# Patient Record
Sex: Male | Born: 1940 | Race: White | Hispanic: No | Smoking: Former smoker
Health system: Southern US, Community
[De-identification: ages and names within clinical notes are randomized; demographics above are authoritative.]

## PROBLEM LIST (undated history)

## (undated) DIAGNOSIS — F411 Generalized anxiety disorder: Secondary | ICD-10-CM

## (undated) DIAGNOSIS — R5381 Other malaise: Secondary | ICD-10-CM

## (undated) DIAGNOSIS — M1990 Unspecified osteoarthritis, unspecified site: Secondary | ICD-10-CM

## (undated) DIAGNOSIS — K635 Polyp of colon: Secondary | ICD-10-CM

## (undated) DIAGNOSIS — E785 Hyperlipidemia, unspecified: Secondary | ICD-10-CM

## (undated) DIAGNOSIS — E7800 Pure hypercholesterolemia, unspecified: Secondary | ICD-10-CM

## (undated) DIAGNOSIS — G629 Polyneuropathy, unspecified: Secondary | ICD-10-CM

## (undated) DIAGNOSIS — F419 Anxiety disorder, unspecified: Secondary | ICD-10-CM

## (undated) HISTORY — DX: Generalized anxiety disorder: F41.1

## (undated) HISTORY — PX: COLONOSCOPY W/ BIOPSIES AND POLYPECTOMY: SHX1376

## (undated) HISTORY — PX: OTHER SURGICAL HISTORY: SHX169

## (undated) HISTORY — PX: CHOLECYSTECTOMY: SHX55

## (undated) HISTORY — PX: APPENDECTOMY: SHX54

---
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
## 2001-11-05 ENCOUNTER — Ambulatory Visit (HOSPITAL_COMMUNITY): Admission: RE | Admit: 2001-11-05 | Discharge: 2001-11-05 | Payer: Self-pay | Admitting: Pulmonary Disease

## 2004-04-06 ENCOUNTER — Ambulatory Visit (HOSPITAL_COMMUNITY): Admission: RE | Admit: 2004-04-06 | Discharge: 2004-04-06 | Payer: Self-pay | Admitting: Internal Medicine

## 2004-05-25 ENCOUNTER — Ambulatory Visit: Payer: Self-pay | Admitting: Internal Medicine

## 2005-04-18 ENCOUNTER — Ambulatory Visit: Payer: Self-pay | Admitting: Cardiology

## 2005-05-17 ENCOUNTER — Ambulatory Visit: Payer: Self-pay | Admitting: Cardiology

## 2006-06-27 ENCOUNTER — Ambulatory Visit: Payer: Self-pay | Admitting: Internal Medicine

## 2006-06-27 ENCOUNTER — Encounter (INDEPENDENT_AMBULATORY_CARE_PROVIDER_SITE_OTHER): Payer: Self-pay | Admitting: *Deleted

## 2006-06-27 ENCOUNTER — Ambulatory Visit (HOSPITAL_COMMUNITY): Admission: RE | Admit: 2006-06-27 | Discharge: 2006-06-27 | Payer: Self-pay | Admitting: Internal Medicine

## 2007-02-20 ENCOUNTER — Ambulatory Visit: Payer: Self-pay | Admitting: Psychology

## 2007-02-20 ENCOUNTER — Encounter: Admission: RE | Admit: 2007-02-20 | Discharge: 2007-03-27 | Payer: Self-pay | Admitting: Neurology

## 2009-01-16 ENCOUNTER — Ambulatory Visit (HOSPITAL_COMMUNITY): Admission: RE | Admit: 2009-01-16 | Discharge: 2009-01-16 | Payer: Self-pay | Admitting: Pulmonary Disease

## 2010-11-05 NOTE — Op Note (Signed)
NAMERIYAN, Scott Chang               ACCOUNT NO.:  [id number]   MEDICAL RECORD NO.:  [id number]          PATIENT TYPE:  AMB   LOCATION:  DAY                           FACILITY:  [facility]   PHYSICIAN:  Lionel December, M.D.    DATE OF BIRTH:  01-11-1941   DATE OF PROCEDURE:  06/27/2006  DATE OF DISCHARGE:                               OPERATIVE REPORT   PROCEDURE:  Colonoscopy.   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  Procedure risks were reviewed the patient, informed consent  was obtained.   MEDS CONSCIOUS SEDATION:  Demerol 50 mg IV, Versed 7 mg IV.   FINDINGS:  Procedure performed in endoscopy suite.  The patient's vital  signs and O2 sat were monitored during the procedure and remained  stable.  The patient was placed left lateral position.  Rectal  examination performed.  No abnormality noted on external or digital  exam.  Pentax videoscope was placed rectum and advanced under vision  into sigmoid colon beyond.  Preparation was satisfactory.  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  Scope was  passed into cecum which was identified by ileocecal valve and  appendiceal orifice.  There was a tiny polyp to the right of ileocecal  valve which was ablated via cold biopsy.  As the scope was withdrawn  colonic mucosa was carefully examined and was normal throughout.  Rectal  mucosa similarly was normal.  Scope was retroflexed to examine anorectal  junction which was unremarkable.  Endoscope was straightened and  withdrawn.  The patient tolerated the procedure well.   FINAL DIAGNOSIS:  Small polyp ablated via cold biopsy from the cecum.  Melanosis coli.   RECOMMENDATIONS:  He will continue high-fiber diet, fiber supplement and  MiraLax as before.   I will be contacting patient  with results of biopsy.  He should return  for follow-up exam in 5 years from now.      Lionel December, M.D.  Electronically Signed     NR/MEDQ  D:  06/27/2006  T:  06/27/2006  Job:  [id number]   cc:   Ramon Dredge L. Juanetta Gosling, M.D.  Fax: [phone]

## 2011-06-23 ENCOUNTER — Encounter (INDEPENDENT_AMBULATORY_CARE_PROVIDER_SITE_OTHER): Payer: Self-pay | Admitting: *Deleted

## 2011-08-18 ENCOUNTER — Other Ambulatory Visit (HOSPITAL_COMMUNITY): Payer: Self-pay | Admitting: Pulmonary Disease

## 2011-08-18 ENCOUNTER — Ambulatory Visit (HOSPITAL_COMMUNITY)
Admission: RE | Admit: 2011-08-18 | Discharge: 2011-08-18 | Disposition: A | Discharge status: Home / Self Care | Payer: Medicare Other | Source: Ambulatory Visit | Attending: Pulmonary Disease | Admitting: Pulmonary Disease

## 2011-08-18 DIAGNOSIS — R059 Cough, unspecified: Secondary | ICD-10-CM

## 2011-08-18 DIAGNOSIS — R05 Cough: Secondary | ICD-10-CM | POA: Insufficient documentation

## 2011-08-18 DIAGNOSIS — R079 Chest pain, unspecified: Secondary | ICD-10-CM | POA: Insufficient documentation

## 2013-02-19 ENCOUNTER — Ambulatory Visit (INDEPENDENT_AMBULATORY_CARE_PROVIDER_SITE_OTHER): Payer: Medicare Other | Admitting: Neurology

## 2013-02-19 ENCOUNTER — Ambulatory Visit (INDEPENDENT_AMBULATORY_CARE_PROVIDER_SITE_OTHER): Payer: Medicare Other

## 2013-02-19 DIAGNOSIS — G5600 Carpal tunnel syndrome, unspecified upper limb: Secondary | ICD-10-CM

## 2013-02-19 DIAGNOSIS — M79609 Pain in unspecified limb: Secondary | ICD-10-CM

## 2013-02-19 DIAGNOSIS — IMO0002 Reserved for concepts with insufficient information to code with codable children: Secondary | ICD-10-CM

## 2013-02-19 DIAGNOSIS — R209 Unspecified disturbances of skin sensation: Secondary | ICD-10-CM

## 2013-02-19 DIAGNOSIS — G629 Polyneuropathy, unspecified: Secondary | ICD-10-CM

## 2013-02-19 DIAGNOSIS — M545 Low back pain: Secondary | ICD-10-CM

## 2013-02-19 NOTE — Procedures (Signed)
    [facility]  NCS (NERVE CONDUCTION STUDY) WITH EMG (ELECTROMYOGRAPHY) REPORT   STUDY DATE: 02/19/2013 PATIENT NAME: Scott Chang DOB: July 17, 1940 MRN: [id number]    TECHNOLOGIST: Gearldine Shown ELECTROMYOGRAPHER: Levert Feinstein M.D.  CLINICAL INFORMATION:   72 years old right-handed Caucasian male, with a long-standing history of chronic back and neck pain, worsened over the past 3 months, constant bilateral below-the-knee pain, subjective weakness and gait difficulty due to pain, no sensory loss, no incontinence  On examination bilateral lower extremity motor examination was normal, sensory was intact to light touch, patellar reflexes 2/4. absent Achilles reflexes.  FINDINGS: NERVE CONDUCTION STUDY: Bilateral peroneal sensory responses showed mildly decreased amplitude, with normal peak latency.  Bilateral tibial motor responses were normal. Bilateral peroneal to EDB motor response showed mild to moderately decreased C. map amplitude, normal conduction velocity, distal latency. Bilateral tibial H. reflexes were absent.  Right ulnar sensory and motor responses were normal.  Bilateral median sensory response showed mild to moderately prolonged peak latency, with normal snap amplitude. Bilateral median motor responses showed mild to moderately prolonged distal latency, normal C. map amplitude, conduction velocity.     NEEDLE ELECTROMYOGRAPHY: Selected needle examination was performed at right lower extremity muscles, and right lumbosacral paraspinal muscles  Needle examination of right tibialis anterior, tibialis posterior, medial gastrocnemius, peroneal longus, biceps femoris long head, vastus lateralis was normal  There was no spontaneous activity at the right lumbosacral paraspinal muscles, right L4, L5, S1  IMPRESSION:  This is a mild abnormal study. There is electrodiagnostic evidence of mild length dependent peripheral neuropathy, there is no evidence of  right lumbosacral radiculopathy.  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   INTERPRETING PHYSICIAN:   Levert Feinstein M.D. Ph.D. [facility] [address], [address] [place], [place] [address] [phone]

## 2013-03-12 ENCOUNTER — Telehealth (INDEPENDENT_AMBULATORY_CARE_PROVIDER_SITE_OTHER): Payer: Self-pay | Admitting: *Deleted

## 2013-03-12 NOTE — Telephone Encounter (Signed)
Last TCS was 06/27/06 and the RECOMMENDATIONS:follow-up exam in 5 years. The return phone number is [phone].

## 2013-03-13 ENCOUNTER — Telehealth (INDEPENDENT_AMBULATORY_CARE_PROVIDER_SITE_OTHER): Payer: Self-pay | Admitting: *Deleted

## 2013-03-13 ENCOUNTER — Other Ambulatory Visit (INDEPENDENT_AMBULATORY_CARE_PROVIDER_SITE_OTHER): Payer: Self-pay | Admitting: *Deleted

## 2013-03-13 DIAGNOSIS — Z86010 Personal history of colonic polyps: Secondary | ICD-10-CM

## 2013-03-13 DIAGNOSIS — Z1211 Encounter for screening for malignant neoplasm of colon: Secondary | ICD-10-CM

## 2013-03-13 MED ORDER — PEG-KCL-NACL-NASULF-NA ASC-C 100 G PO SOLR: 1.0000 | Freq: Once | ORAL | Status: DC

## 2013-03-13 NOTE — Telephone Encounter (Signed)
Patient needs movi prep 

## 2013-03-13 NOTE — Telephone Encounter (Signed)
TCS sch'd 04/24/13, patient aware

## 2013-04-22 ENCOUNTER — Telehealth (INDEPENDENT_AMBULATORY_CARE_PROVIDER_SITE_OTHER): Payer: Self-pay | Admitting: *Deleted

## 2013-04-22 NOTE — Telephone Encounter (Signed)
  Procedure: tcs  Reason/Indication:  Hx polyps  Has patient had this procedure before?  Yes, 2008 (EPIC)  If so, when, by whom and where?    Is there a family history of colon cancer?  no  Who?  What age when diagnosed?    Is patient diabetic?   no      Does patient have prosthetic heart valve?  no  Do you have a pacemaker?  no  Has patient ever had endocarditis? no  Has patient had joint replacement within last 12 months?  no  Does patient tend to be constipated or take laxatives? no  Is patient on Coumadin, Plavix and/or Aspirin? no  Medications: lipitor 20 mg daily, hydrocodone 10/325 mg bid, xanax 1 mg bid, fish oil 1000 mg, flax seed oil 1000 mg, centrum silver  Allergies: pcn  Medication Adjustment:   Procedure date & time: 05/09/13 at 1030

## 2013-04-24 NOTE — Telephone Encounter (Signed)
agree

## 2013-04-25 ENCOUNTER — Encounter (HOSPITAL_COMMUNITY): Payer: Self-pay | Admitting: Pharmacy Technician

## 2013-05-09 ENCOUNTER — Encounter (HOSPITAL_COMMUNITY): Payer: Self-pay | Admitting: *Deleted

## 2013-05-09 ENCOUNTER — Encounter (HOSPITAL_COMMUNITY)
Admission: RE | Disposition: A | Discharge status: Home / Self Care | Payer: Self-pay | Source: Ambulatory Visit | Attending: Internal Medicine

## 2013-05-09 ENCOUNTER — Ambulatory Visit (HOSPITAL_COMMUNITY)
Admission: RE | Admit: 2013-05-09 | Discharge: 2013-05-09 | Disposition: A | Discharge status: Home / Self Care | Payer: Medicare Other | Source: Ambulatory Visit | Attending: Internal Medicine | Admitting: Internal Medicine

## 2013-05-09 DIAGNOSIS — Z860100 Personal history of colon polyps, unspecified: Secondary | ICD-10-CM | POA: Insufficient documentation

## 2013-05-09 DIAGNOSIS — D126 Benign neoplasm of colon, unspecified: Secondary | ICD-10-CM

## 2013-05-09 DIAGNOSIS — K644 Residual hemorrhoidal skin tags: Secondary | ICD-10-CM

## 2013-05-09 HISTORY — DX: Polyneuropathy, unspecified: G62.9

## 2013-05-09 HISTORY — PX: COLONOSCOPY: SHX5424

## 2013-05-09 HISTORY — DX: Pure hypercholesterolemia, unspecified: E78.00

## 2013-05-09 HISTORY — DX: Anxiety disorder, unspecified: F41.9

## 2013-05-09 HISTORY — DX: Unspecified osteoarthritis, unspecified site: M19.90

## 2013-05-09 HISTORY — DX: Polyp of colon: K63.5

## 2013-05-09 SURGERY — COLONOSCOPY
Anesthesia: Moderate Sedation

## 2013-05-09 MED ORDER — MIDAZOLAM HCL 5 MG/5ML IJ SOLN
INTRAMUSCULAR | Status: DC | PRN
  Administered 2013-05-09 (×5): 2 mg via INTRAVENOUS

## 2013-05-09 MED ORDER — SODIUM CHLORIDE 0.9 % IV SOLN
INTRAVENOUS | Status: DC
  Administered 2013-05-09: 10:00:00 via INTRAVENOUS

## 2013-05-09 MED ORDER — STERILE WATER FOR IRRIGATION IR SOLN
Status: DC | PRN
  Administered 2013-05-09: 11:00:00

## 2013-05-09 MED ORDER — MEPERIDINE HCL 50 MG/ML IJ SOLN
INTRAMUSCULAR | Status: AC
  Filled 2013-05-09: qty 1

## 2013-05-09 MED ORDER — MEPERIDINE HCL 50 MG/ML IJ SOLN
INTRAMUSCULAR | Status: DC | PRN
  Administered 2013-05-09 (×2): 25 mg via INTRAVENOUS

## 2013-05-09 MED ORDER — MIDAZOLAM HCL 5 MG/5ML IJ SOLN
INTRAMUSCULAR | Status: AC
  Filled 2013-05-09: qty 10

## 2013-05-09 NOTE — H&P (Signed)
Scott Chang is an 72 y.o. male.   Chief Complaint: Patient is here for colonoscopy. HPI: Patient is 72 year old Caucasian male with history of colonic adenomas and is for surveillance colonoscopy. His last exam was in 2001. He denies abdominal pain change in bowel habits or rectal bleeding. Family history is negative for CRC but his brother also has had colonic adenomas.  Past Medical History  Diagnosis Date  . Hypercholesteremia   . Anxiety   . Colon polyps   . Arthritis   . Neuropathy     Past Surgical History  Procedure Laterality Date  . Colonoscopy w/ biopsies and polypectomy    . Left knee arthroscopy    . Left shoulder arthroscopy      X 2  . Cholecystectomy    . Appendectomy      Family History  Problem Relation Age of Onset  . Colon cancer Neg Hx    Social History:  reports that he has quit smoking. His smoking use included Cigarettes. He has a 40 pack-year smoking history. He does not have any smokeless tobacco history on file. He reports that he drinks alcohol. He reports that he does not use illicit drugs.  Allergies:  Allergies  Allergen Reactions  . Penicillins Itching and Rash    Medications Prior to Admission  Medication Sig Dispense Refill  . ALPRAZolam (XANAX) 1 MG tablet Take 1 mg by mouth at bedtime as needed for sleep.       Marland Kitchen atorvastatin (LIPITOR) 20 MG tablet Take 20 mg by mouth daily.      . Coenzyme Q10 200 MG capsule Take 200 mg by mouth daily.      . fish oil-omega-3 fatty acids 1000 MG capsule Take 1 g by mouth daily.      . Flaxseed, Linseed, (FLAXSEED OIL) 1200 MG CAPS Take 1 capsule by mouth daily.      Marland Kitchen HYDROcodone-acetaminophen (NORCO) 10-325 MG per tablet Take 1 tablet by mouth every 6 (six) hours as needed for moderate pain.      . Multiple Vitamin (MULTIVITAMIN WITH MINERALS) TABS tablet Take 1 tablet by mouth daily.      . peg 3350 powder (MOVIPREP) 100 G SOLR Take 1 kit (200 g total) by mouth once.  1 kit  0  . Polyethyl  Glycol-Propyl Glycol (SYSTANE OP) Place 1 drop into both eyes daily as needed (Dry Eyes).      . Potassium Gluconate 595 MG CAPS Take 1 capsule by mouth daily.        No results found for this or any previous visit (from the past 48 hour(s)). No results found.  ROS  Blood pressure 132/74, pulse 70, temperature 97.7 F (36.5 C), temperature source Oral, resp. rate 12, height 5\' 11"  (1.803 m), weight 165 lb (74.844 kg), SpO2 100.00%. Physical Exam  Constitutional: He appears well-developed and well-nourished.  HENT:  Mouth/Throat: Oropharynx is clear and moist.  Eyes: Conjunctivae are normal. No scleral icterus.  Neck: No thyromegaly present.  Cardiovascular: Normal rate, regular rhythm and normal heart sounds.   No murmur heard. Respiratory: Effort normal and breath sounds normal.  GI: Soft. He exhibits no distension and no mass. There is no tenderness.  Musculoskeletal: He exhibits no edema.  Lymphadenopathy:    He has no cervical adenopathy.  Neurological: He is alert.  Skin: Skin is warm and dry.     Assessment/Plan History of colonic adenomas. Surveillance colonoscopy.  Maeleigh Buschman U 05/09/2013, 10:30 AM

## 2013-05-09 NOTE — Op Note (Signed)
COLONOSCOPY PROCEDURE REPORT  PATIENT:  Scott Chang  MR#:  [id number] Birthdate:  [birth year], 72 y.o., male Endoscopist:  Dr. Malissa Hippo, MD Referred By:  Dr. Ignatius Specking, MD Procedure Date: 05/09/2013  Procedure:   Colonoscopy  Indications:  Patient is 72 year old Caucasian male with history of colonic adenomas. His last colonoscopy was in January 2008.  Informed Consent:  The procedure and risks were reviewed with the patient and informed consent was obtained.  Medications:  Demerol 50 mg IV Versed 10 mg IV  Description of procedure:  After a digital rectal exam was performed, that colonoscope was advanced from the anus through the rectum and colon to the area of the cecum, ileocecal valve and appendiceal orifice. The cecum was deeply intubated. These structures were well-seen and photographed for the record. From the level of the cecum and ileocecal valve, the scope was slowly and cautiously withdrawn. The mucosal surfaces were carefully surveyed utilizing scope tip to flexion to facilitate fold flattening as needed. The scope was pulled down into the rectum where a thorough exam including retroflexion was performed.  Findings:   Prep excellent. 3 mm polyp ablated via cold biopsy from the cecum. Mucosa of the rest of the colon and rectum was normal. Small hemorrhoids below the dentate line.   Therapeutic/Diagnostic Maneuvers Performed:  See above  Complications:  None  Cecal Withdrawal Time:  11 minutes  Impression:  Examination performed to cecum. 3 mm cecal polyp ablated via cold biopsy. Small external hemorrhoids.  Recommendations:  Standard instructions given. I will contact patient with biopsy results and further recommendations.  Chiquita Heckert U  05/09/2013 11:03 AM  CC: Dr. Ignatius Specking., MD & Dr. Bonnetta Barry ref. provider found

## 2013-05-15 ENCOUNTER — Encounter (HOSPITAL_COMMUNITY): Payer: Self-pay | Admitting: Internal Medicine

## 2013-05-22 ENCOUNTER — Encounter (INDEPENDENT_AMBULATORY_CARE_PROVIDER_SITE_OTHER): Payer: Self-pay | Admitting: *Deleted

## 2013-10-04 ENCOUNTER — Other Ambulatory Visit (HOSPITAL_COMMUNITY): Payer: Self-pay | Admitting: Pulmonary Disease

## 2013-10-04 ENCOUNTER — Ambulatory Visit (HOSPITAL_COMMUNITY)
Admission: RE | Admit: 2013-10-04 | Discharge: 2013-10-04 | Disposition: A | Discharge status: Home / Self Care | Payer: Medicare PPO | Source: Ambulatory Visit | Attending: Pulmonary Disease | Admitting: Pulmonary Disease

## 2013-10-04 DIAGNOSIS — R0602 Shortness of breath: Secondary | ICD-10-CM

## 2013-10-08 ENCOUNTER — Ambulatory Visit (HOSPITAL_COMMUNITY)
Admission: RE | Admit: 2013-10-08 | Discharge: 2013-10-08 | Disposition: A | Discharge status: Home / Self Care | Payer: Medicare PPO | Source: Ambulatory Visit | Attending: Pulmonary Disease | Admitting: Pulmonary Disease

## 2013-10-08 ENCOUNTER — Other Ambulatory Visit: Payer: Self-pay | Admitting: Pulmonary Disease

## 2013-10-08 DIAGNOSIS — R0609 Other forms of dyspnea: Secondary | ICD-10-CM | POA: Insufficient documentation

## 2013-10-08 DIAGNOSIS — E785 Hyperlipidemia, unspecified: Secondary | ICD-10-CM | POA: Insufficient documentation

## 2013-10-08 DIAGNOSIS — Z87891 Personal history of nicotine dependence: Secondary | ICD-10-CM | POA: Insufficient documentation

## 2013-10-08 DIAGNOSIS — R0989 Other specified symptoms and signs involving the circulatory and respiratory systems: Principal | ICD-10-CM | POA: Insufficient documentation

## 2013-10-08 NOTE — Progress Notes (Signed)
*  PRELIMINARY RESULTS* Echocardiogram 2D Echocardiogram has been performed.  Su Grand Shell Blanchette 10/08/2013, 9:16 AM

## 2013-10-09 ENCOUNTER — Other Ambulatory Visit (HOSPITAL_COMMUNITY): Payer: Self-pay | Admitting: Pulmonary Disease

## 2013-10-09 DIAGNOSIS — R0602 Shortness of breath: Secondary | ICD-10-CM

## 2013-10-10 ENCOUNTER — Ambulatory Visit (HOSPITAL_COMMUNITY): Payer: Medicare PPO

## 2013-10-11 ENCOUNTER — Ambulatory Visit (HOSPITAL_COMMUNITY)
Admission: RE | Admit: 2013-10-11 | Discharge: 2013-10-11 | Disposition: A | Discharge status: Home / Self Care | Payer: Medicare HMO | Source: Ambulatory Visit | Attending: Pulmonary Disease | Admitting: Pulmonary Disease

## 2013-10-11 DIAGNOSIS — R079 Chest pain, unspecified: Secondary | ICD-10-CM | POA: Diagnosis present

## 2013-10-11 DIAGNOSIS — R0602 Shortness of breath: Secondary | ICD-10-CM | POA: Diagnosis not present

## 2013-10-11 LAB — POCT I-STAT CREATININE: Creatinine, Ser: 1 mg/dL (ref 0.50–1.35)

## 2013-10-11 MED ORDER — IOHEXOL 350 MG/ML SOLN
100.0000 mL | Freq: Once | INTRAVENOUS | Status: AC | PRN
  Administered 2013-10-11: 100 mL via INTRAVENOUS

## 2014-07-18 DIAGNOSIS — Z4789 Encounter for other orthopedic aftercare: Secondary | ICD-10-CM | POA: Diagnosis not present

## 2015-01-27 DIAGNOSIS — M545 Low back pain: Secondary | ICD-10-CM | POA: Diagnosis not present

## 2015-01-27 DIAGNOSIS — M159 Polyosteoarthritis, unspecified: Secondary | ICD-10-CM | POA: Diagnosis not present

## 2015-01-27 DIAGNOSIS — Z125 Encounter for screening for malignant neoplasm of prostate: Secondary | ICD-10-CM | POA: Diagnosis not present

## 2015-01-27 DIAGNOSIS — Z0000 Encounter for general adult medical examination without abnormal findings: Secondary | ICD-10-CM | POA: Diagnosis not present

## 2015-01-27 DIAGNOSIS — R0789 Other chest pain: Secondary | ICD-10-CM | POA: Diagnosis not present

## 2015-01-27 DIAGNOSIS — E782 Mixed hyperlipidemia: Secondary | ICD-10-CM | POA: Diagnosis not present

## 2015-01-27 DIAGNOSIS — M5136 Other intervertebral disc degeneration, lumbar region: Secondary | ICD-10-CM | POA: Diagnosis not present

## 2015-01-27 DIAGNOSIS — Z1211 Encounter for screening for malignant neoplasm of colon: Secondary | ICD-10-CM | POA: Diagnosis not present

## 2015-03-27 DIAGNOSIS — Z23 Encounter for immunization: Secondary | ICD-10-CM | POA: Diagnosis not present

## 2015-04-15 DIAGNOSIS — H5210 Myopia, unspecified eye: Secondary | ICD-10-CM | POA: Diagnosis not present

## 2015-04-15 DIAGNOSIS — H5200 Hypermetropia, unspecified eye: Secondary | ICD-10-CM | POA: Diagnosis not present

## 2015-05-10 IMAGING — CR DG CHEST 2V
2 series · 2 of 2 positions shown · non-contrast
Comparison: DG CHEST 2 VIEW dated 08/18/2011

CLINICAL DATA: sob

EXAM:
CHEST  2 VIEW

[view not recorded (1 of 2)]
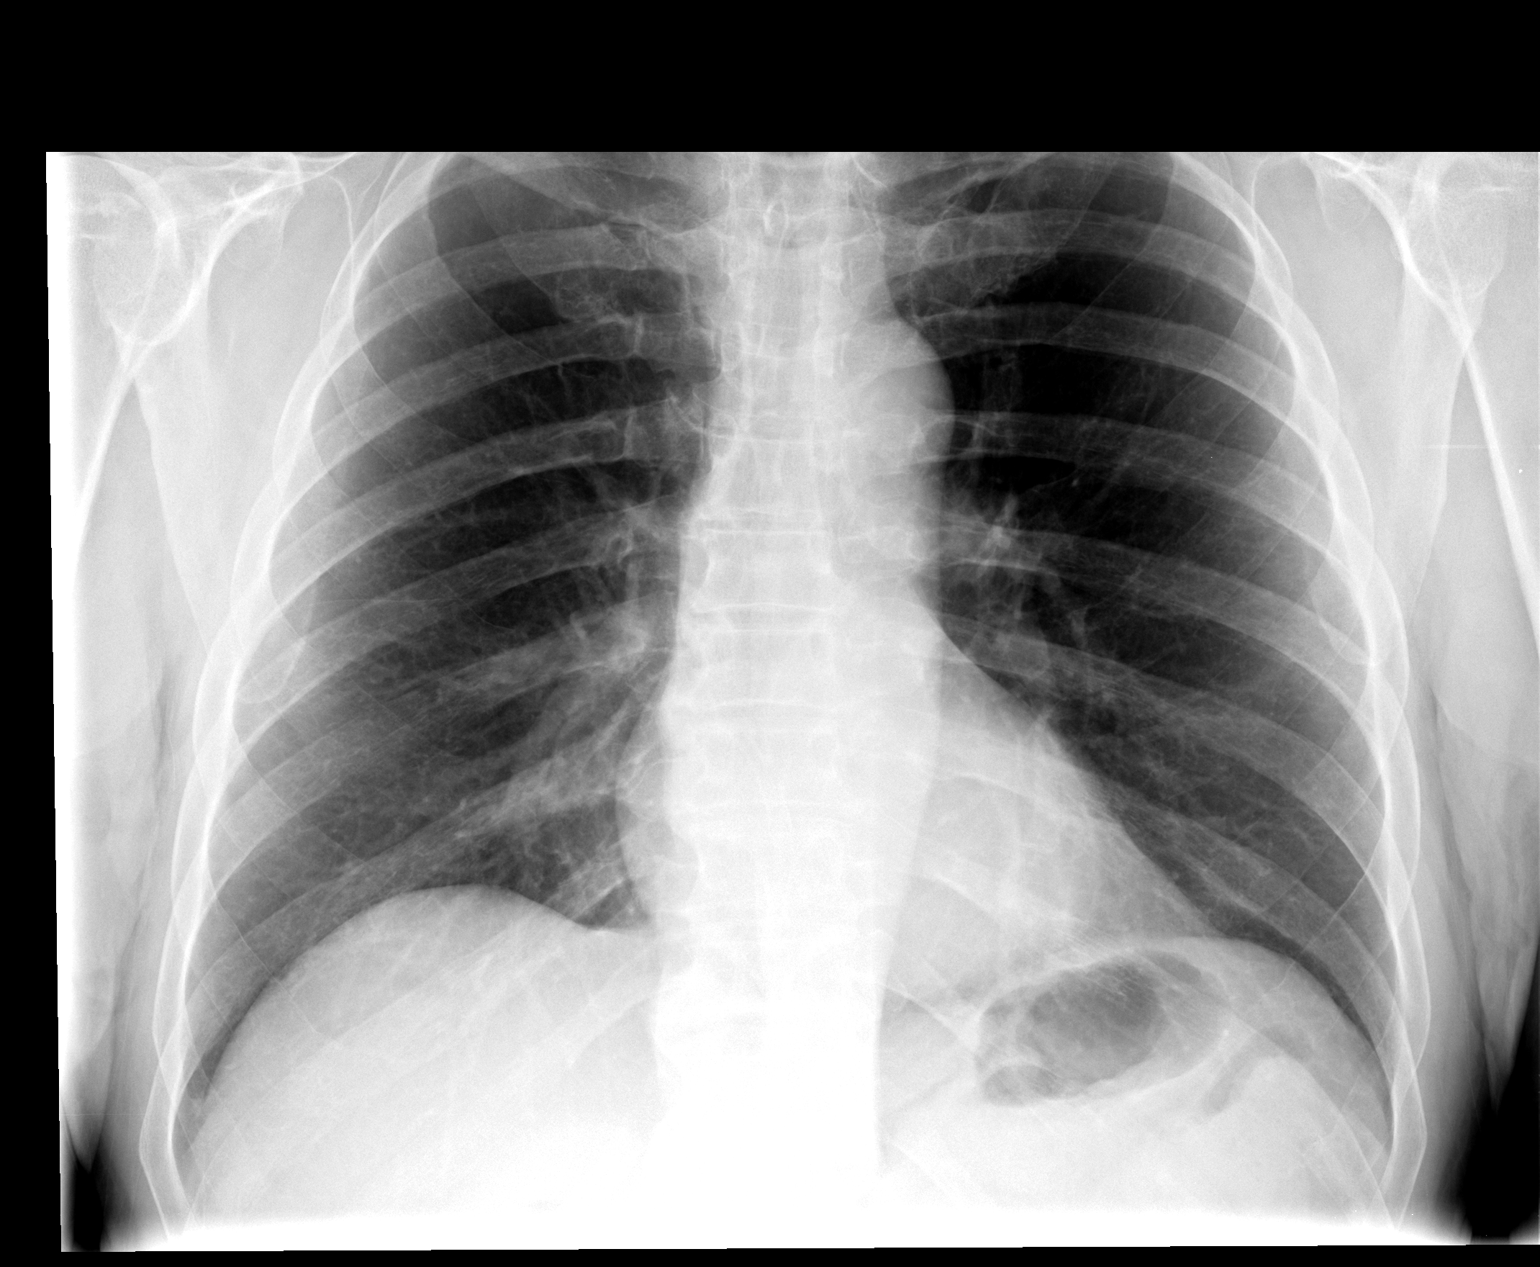

[view not recorded (2 of 2)]
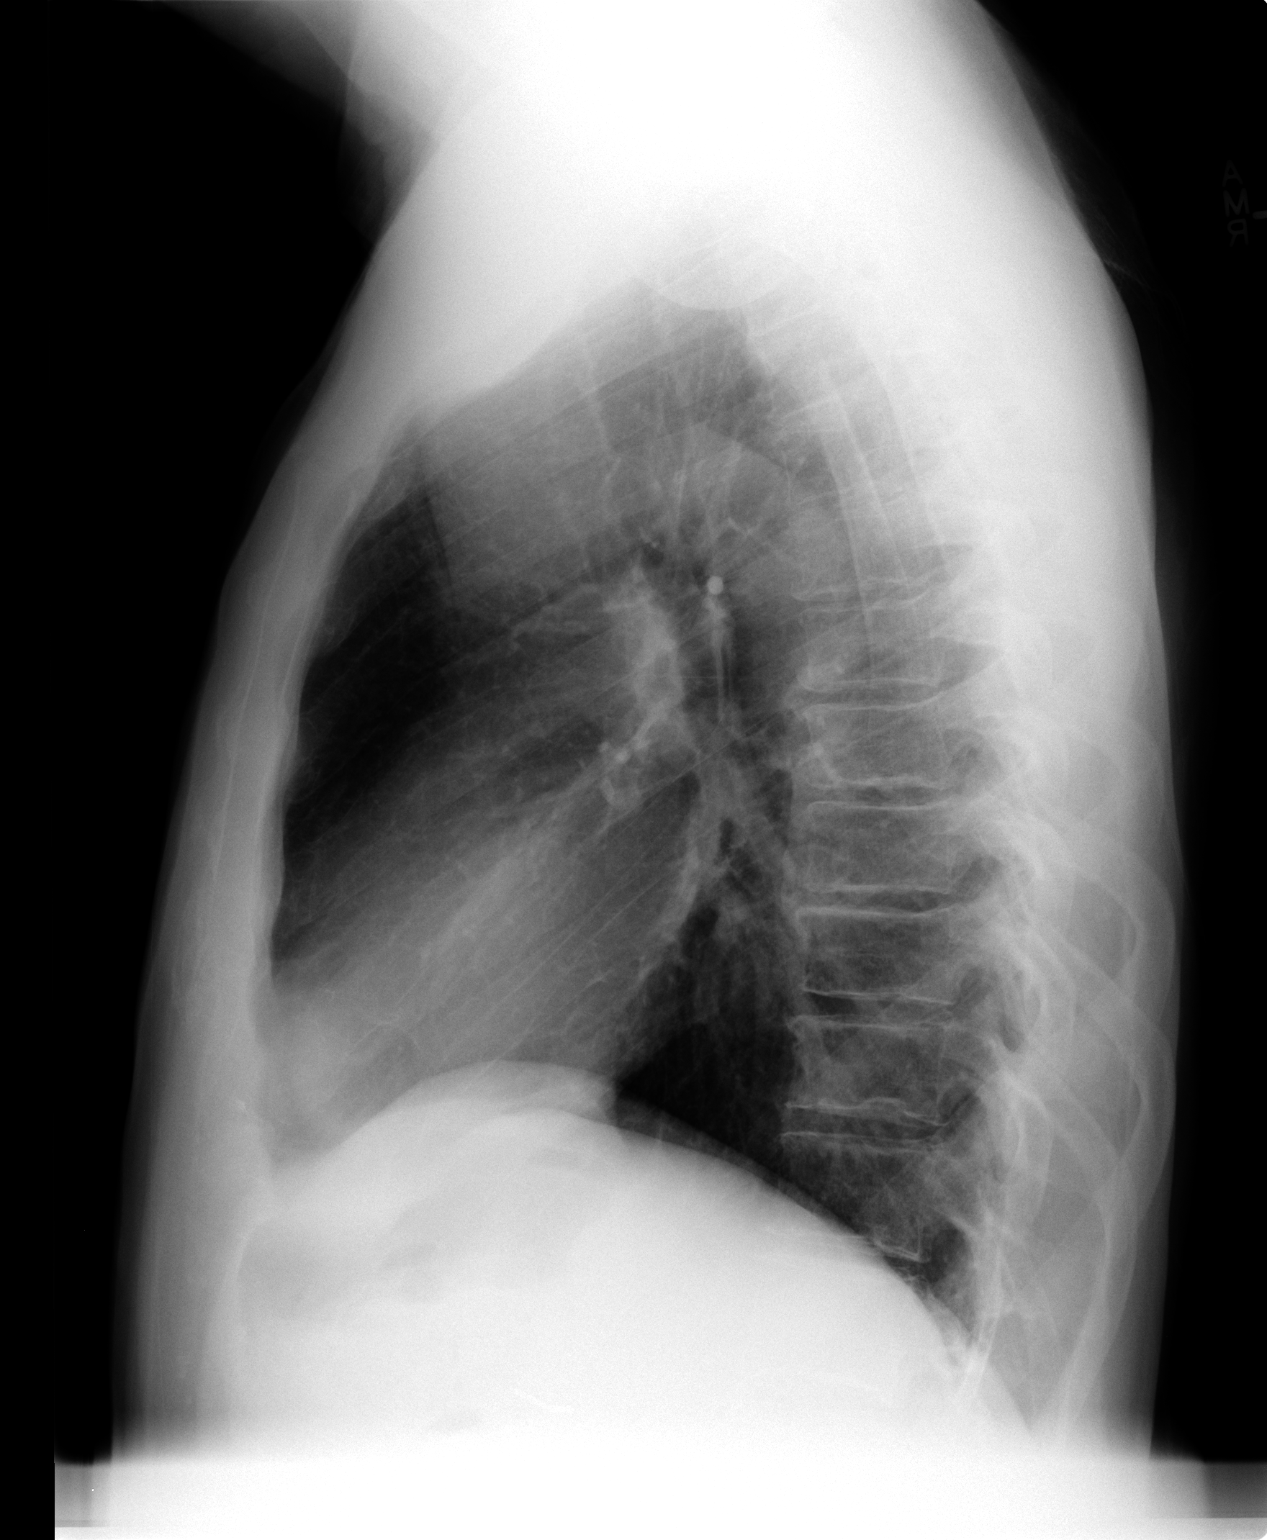

[2 of 2 positions shown; findings below may reference images not displayed]

FINDINGS: The heart size and mediastinal contours are within normal limits.
Both lungs are clear. The visualized skeletal structures are
unremarkable.
IMPRESSION: No active cardiopulmonary disease.

## 2015-06-24 DIAGNOSIS — M25562 Pain in left knee: Secondary | ICD-10-CM | POA: Diagnosis not present

## 2015-06-24 DIAGNOSIS — M25561 Pain in right knee: Secondary | ICD-10-CM | POA: Diagnosis not present

## 2015-06-24 DIAGNOSIS — M170 Bilateral primary osteoarthritis of knee: Secondary | ICD-10-CM | POA: Diagnosis not present

## 2015-07-06 DIAGNOSIS — M170 Bilateral primary osteoarthritis of knee: Secondary | ICD-10-CM | POA: Diagnosis not present

## 2015-07-06 DIAGNOSIS — R2689 Other abnormalities of gait and mobility: Secondary | ICD-10-CM | POA: Diagnosis not present

## 2015-07-06 DIAGNOSIS — M25561 Pain in right knee: Secondary | ICD-10-CM | POA: Diagnosis not present

## 2015-07-06 DIAGNOSIS — R262 Difficulty in walking, not elsewhere classified: Secondary | ICD-10-CM | POA: Diagnosis not present

## 2015-07-06 DIAGNOSIS — M25562 Pain in left knee: Secondary | ICD-10-CM | POA: Diagnosis not present

## 2015-07-20 DIAGNOSIS — M170 Bilateral primary osteoarthritis of knee: Secondary | ICD-10-CM | POA: Diagnosis not present

## 2015-07-20 DIAGNOSIS — M25561 Pain in right knee: Secondary | ICD-10-CM | POA: Diagnosis not present

## 2015-07-20 DIAGNOSIS — R2689 Other abnormalities of gait and mobility: Secondary | ICD-10-CM | POA: Diagnosis not present

## 2015-07-20 DIAGNOSIS — M25562 Pain in left knee: Secondary | ICD-10-CM | POA: Diagnosis not present

## 2015-07-20 DIAGNOSIS — M1711 Unilateral primary osteoarthritis, right knee: Secondary | ICD-10-CM | POA: Diagnosis not present

## 2015-07-23 DIAGNOSIS — M25562 Pain in left knee: Secondary | ICD-10-CM | POA: Diagnosis not present

## 2015-07-23 DIAGNOSIS — R2689 Other abnormalities of gait and mobility: Secondary | ICD-10-CM | POA: Diagnosis not present

## 2015-07-23 DIAGNOSIS — M1712 Unilateral primary osteoarthritis, left knee: Secondary | ICD-10-CM | POA: Diagnosis not present

## 2015-07-23 DIAGNOSIS — M170 Bilateral primary osteoarthritis of knee: Secondary | ICD-10-CM | POA: Diagnosis not present

## 2015-07-23 DIAGNOSIS — M25561 Pain in right knee: Secondary | ICD-10-CM | POA: Diagnosis not present

## 2015-07-27 DIAGNOSIS — M170 Bilateral primary osteoarthritis of knee: Secondary | ICD-10-CM | POA: Diagnosis not present

## 2015-07-27 DIAGNOSIS — M25561 Pain in right knee: Secondary | ICD-10-CM | POA: Diagnosis not present

## 2015-07-27 DIAGNOSIS — M25562 Pain in left knee: Secondary | ICD-10-CM | POA: Diagnosis not present

## 2015-07-27 DIAGNOSIS — M1711 Unilateral primary osteoarthritis, right knee: Secondary | ICD-10-CM | POA: Diagnosis not present

## 2015-07-27 DIAGNOSIS — R2689 Other abnormalities of gait and mobility: Secondary | ICD-10-CM | POA: Diagnosis not present

## 2015-07-30 DIAGNOSIS — M25561 Pain in right knee: Secondary | ICD-10-CM | POA: Diagnosis not present

## 2015-07-30 DIAGNOSIS — M25562 Pain in left knee: Secondary | ICD-10-CM | POA: Diagnosis not present

## 2015-07-30 DIAGNOSIS — M1712 Unilateral primary osteoarthritis, left knee: Secondary | ICD-10-CM | POA: Diagnosis not present

## 2015-07-30 DIAGNOSIS — M170 Bilateral primary osteoarthritis of knee: Secondary | ICD-10-CM | POA: Diagnosis not present

## 2015-07-30 DIAGNOSIS — R2689 Other abnormalities of gait and mobility: Secondary | ICD-10-CM | POA: Diagnosis not present

## 2015-08-03 DIAGNOSIS — M25561 Pain in right knee: Secondary | ICD-10-CM | POA: Diagnosis not present

## 2015-08-03 DIAGNOSIS — M170 Bilateral primary osteoarthritis of knee: Secondary | ICD-10-CM | POA: Diagnosis not present

## 2015-08-03 DIAGNOSIS — M1711 Unilateral primary osteoarthritis, right knee: Secondary | ICD-10-CM | POA: Diagnosis not present

## 2015-08-03 DIAGNOSIS — R2689 Other abnormalities of gait and mobility: Secondary | ICD-10-CM | POA: Diagnosis not present

## 2015-08-03 DIAGNOSIS — M25562 Pain in left knee: Secondary | ICD-10-CM | POA: Diagnosis not present

## 2015-08-06 DIAGNOSIS — M170 Bilateral primary osteoarthritis of knee: Secondary | ICD-10-CM | POA: Diagnosis not present

## 2015-08-06 DIAGNOSIS — R2689 Other abnormalities of gait and mobility: Secondary | ICD-10-CM | POA: Diagnosis not present

## 2015-08-06 DIAGNOSIS — M25562 Pain in left knee: Secondary | ICD-10-CM | POA: Diagnosis not present

## 2015-08-06 DIAGNOSIS — M25561 Pain in right knee: Secondary | ICD-10-CM | POA: Diagnosis not present

## 2015-08-19 DIAGNOSIS — H43811 Vitreous degeneration, right eye: Secondary | ICD-10-CM | POA: Diagnosis not present

## 2016-02-09 DIAGNOSIS — Z1211 Encounter for screening for malignant neoplasm of colon: Secondary | ICD-10-CM | POA: Diagnosis not present

## 2016-02-09 DIAGNOSIS — M5136 Other intervertebral disc degeneration, lumbar region: Secondary | ICD-10-CM | POA: Diagnosis not present

## 2016-02-09 DIAGNOSIS — Z125 Encounter for screening for malignant neoplasm of prostate: Secondary | ICD-10-CM | POA: Diagnosis not present

## 2016-02-09 DIAGNOSIS — M545 Low back pain: Secondary | ICD-10-CM | POA: Diagnosis not present

## 2016-02-09 DIAGNOSIS — Z0000 Encounter for general adult medical examination without abnormal findings: Secondary | ICD-10-CM | POA: Diagnosis not present

## 2016-02-09 DIAGNOSIS — M159 Polyosteoarthritis, unspecified: Secondary | ICD-10-CM | POA: Diagnosis not present

## 2016-02-09 DIAGNOSIS — R0789 Other chest pain: Secondary | ICD-10-CM | POA: Diagnosis not present

## 2016-02-24 DIAGNOSIS — H43813 Vitreous degeneration, bilateral: Secondary | ICD-10-CM | POA: Diagnosis not present

## 2016-07-19 ENCOUNTER — Encounter: Payer: Self-pay | Admitting: Internal Medicine

## 2016-08-31 DIAGNOSIS — H5200 Hypermetropia, unspecified eye: Secondary | ICD-10-CM | POA: Diagnosis not present

## 2016-11-01 DIAGNOSIS — H2513 Age-related nuclear cataract, bilateral: Secondary | ICD-10-CM | POA: Diagnosis not present

## 2016-11-01 DIAGNOSIS — H02839 Dermatochalasis of unspecified eye, unspecified eyelid: Secondary | ICD-10-CM | POA: Diagnosis not present

## 2016-11-01 DIAGNOSIS — H2512 Age-related nuclear cataract, left eye: Secondary | ICD-10-CM | POA: Diagnosis not present

## 2016-11-01 DIAGNOSIS — H40003 Preglaucoma, unspecified, bilateral: Secondary | ICD-10-CM | POA: Diagnosis not present

## 2016-11-01 DIAGNOSIS — H25013 Cortical age-related cataract, bilateral: Secondary | ICD-10-CM | POA: Diagnosis not present

## 2016-11-28 DIAGNOSIS — H2512 Age-related nuclear cataract, left eye: Secondary | ICD-10-CM | POA: Diagnosis not present

## 2016-11-29 DIAGNOSIS — H2511 Age-related nuclear cataract, right eye: Secondary | ICD-10-CM | POA: Diagnosis not present

## 2016-12-05 DIAGNOSIS — H25812 Combined forms of age-related cataract, left eye: Secondary | ICD-10-CM | POA: Diagnosis not present

## 2016-12-19 DIAGNOSIS — H2511 Age-related nuclear cataract, right eye: Secondary | ICD-10-CM | POA: Diagnosis not present

## 2016-12-26 DIAGNOSIS — H25811 Combined forms of age-related cataract, right eye: Secondary | ICD-10-CM | POA: Diagnosis not present

## 2016-12-26 DIAGNOSIS — H25812 Combined forms of age-related cataract, left eye: Secondary | ICD-10-CM | POA: Diagnosis not present

## 2017-01-18 DIAGNOSIS — H2511 Age-related nuclear cataract, right eye: Secondary | ICD-10-CM | POA: Diagnosis not present

## 2017-02-08 ENCOUNTER — Ambulatory Visit (HOSPITAL_COMMUNITY)
Admission: RE | Admit: 2017-02-08 | Discharge: 2017-02-08 | Disposition: A | Discharge status: Home / Self Care | Payer: Medicare PPO | Source: Ambulatory Visit | Attending: Pulmonary Disease | Admitting: Pulmonary Disease

## 2017-02-08 ENCOUNTER — Other Ambulatory Visit (HOSPITAL_COMMUNITY): Payer: Self-pay | Admitting: Pulmonary Disease

## 2017-02-08 DIAGNOSIS — F419 Anxiety disorder, unspecified: Secondary | ICD-10-CM | POA: Diagnosis not present

## 2017-02-08 DIAGNOSIS — M19032 Primary osteoarthritis, left wrist: Secondary | ICD-10-CM | POA: Diagnosis not present

## 2017-02-08 DIAGNOSIS — M542 Cervicalgia: Secondary | ICD-10-CM | POA: Insufficient documentation

## 2017-02-08 DIAGNOSIS — M545 Low back pain: Secondary | ICD-10-CM | POA: Diagnosis not present

## 2017-02-08 DIAGNOSIS — R52 Pain, unspecified: Secondary | ICD-10-CM

## 2017-02-08 DIAGNOSIS — J449 Chronic obstructive pulmonary disease, unspecified: Secondary | ICD-10-CM | POA: Diagnosis not present

## 2017-03-02 DIAGNOSIS — M545 Low back pain: Secondary | ICD-10-CM | POA: Diagnosis not present

## 2017-03-02 DIAGNOSIS — Z125 Encounter for screening for malignant neoplasm of prostate: Secondary | ICD-10-CM | POA: Diagnosis not present

## 2017-03-02 DIAGNOSIS — E782 Mixed hyperlipidemia: Secondary | ICD-10-CM | POA: Diagnosis not present

## 2017-03-02 DIAGNOSIS — R739 Hyperglycemia, unspecified: Secondary | ICD-10-CM | POA: Diagnosis not present

## 2017-03-02 DIAGNOSIS — M159 Polyosteoarthritis, unspecified: Secondary | ICD-10-CM | POA: Diagnosis not present

## 2017-03-02 DIAGNOSIS — R0602 Shortness of breath: Secondary | ICD-10-CM | POA: Diagnosis not present

## 2017-03-02 DIAGNOSIS — R5383 Other fatigue: Secondary | ICD-10-CM | POA: Diagnosis not present

## 2017-03-02 DIAGNOSIS — M5136 Other intervertebral disc degeneration, lumbar region: Secondary | ICD-10-CM | POA: Diagnosis not present

## 2017-03-02 DIAGNOSIS — Z0000 Encounter for general adult medical examination without abnormal findings: Secondary | ICD-10-CM | POA: Diagnosis not present

## 2017-03-20 DIAGNOSIS — Z23 Encounter for immunization: Secondary | ICD-10-CM | POA: Diagnosis not present

## 2017-03-30 ENCOUNTER — Ambulatory Visit (INDEPENDENT_AMBULATORY_CARE_PROVIDER_SITE_OTHER): Payer: Medicare HMO | Admitting: Cardiovascular Disease

## 2017-03-30 ENCOUNTER — Encounter: Payer: Self-pay | Admitting: Cardiovascular Disease

## 2017-03-30 VITALS — BP 112/66 | HR 79 | Ht 70.0 in | Wt 159.0 lb

## 2017-03-30 DIAGNOSIS — R5383 Other fatigue: Secondary | ICD-10-CM

## 2017-03-30 DIAGNOSIS — R0609 Other forms of dyspnea: Secondary | ICD-10-CM | POA: Diagnosis not present

## 2017-03-30 DIAGNOSIS — R0602 Shortness of breath: Secondary | ICD-10-CM | POA: Diagnosis not present

## 2017-03-30 NOTE — Patient Instructions (Signed)
Your physician recommends that you schedule a follow-up appointment in:  In a few weeks with [facility] has requested that you have a lexiscan myoview. For further information please visit [email or web address]. Please follow instruction sheet, as given.     Your physician recommends that you continue on your current medications as directed. Please refer to the Current Medication list given to you today.     If you need a refill on your cardiac medications before your next appointment, please call your pharmacy.      Thank you for choosing [facility] !

## 2017-03-30 NOTE — Progress Notes (Signed)
CARDIOLOGY CONSULT NOTE  Patient ID: Scott Chang MRN: [id number] DOB/AGE: 01/14/1941 76 y.o.  Admit date: (Not on file) Primary Physician: Sinda Du, MD Referring Physician: Dr. Luan Pulling  Reason for Consultation: Shortness of breath  HPI: Scott Chang is a 76 y.o. male who is being seen today for the evaluation of shortness of breath at the request of Sinda Du, MD.   He has been experiencing exertional dyspnea. His PCP who is also a pulmonologist did not feel it was due to lung disease.  He tells me that for the past 4 months, he has felt more fatigued with performance of activities that he used to do without any difficulties. 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 However he also does 2 hours of pushing mowing. Now he feels tired out after walking 50 yards. He denies chest tightness. He denies associated diaphoresis, lightheadedness, palpitations, dizziness, and syncope.  He has developed bilateral arm aching and weakness with exertion as well.  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  ECG performed in the office today which I ordered and personally interpreted demonstrates normal sinus rhythm with no ischemic ST segment or T-wave abnormalities, nor any arrhythmias.  Echocardiogram 10/08/13: Normal left ventricular systolic and diastolic function with normal regional wall motion, LVEF 55-60%.  Lipid panel 03/02/17: Total cholesterol 210, HDL 60, triglycerides 129, LDL 126.  Additional labs showed the following: BUN 9, creatinine 0.76, sodium 140, potassium 4.7, TSH 2.2, hemoglobin 14.2.     Allergies  Allergen Reactions  . Penicillins Itching and Rash    Current Outpatient Prescriptions  Medication Sig Dispense Refill  . ALPRAZolam (XANAX) 1 MG tablet Take 1 mg by mouth at bedtime as needed for sleep.     Marland Kitchen atorvastatin (LIPITOR) 20 MG tablet Take 20 mg by mouth daily.    . Coenzyme Q10 200 MG capsule Take 200 mg by mouth  daily.    . Flaxseed, Linseed, (FLAXSEED OIL) 1200 MG CAPS Take 1 capsule by mouth daily.    Marland Kitchen HYDROcodone-acetaminophen (NORCO) 10-325 MG per tablet Take 1 tablet by mouth every 6 (six) hours as needed for moderate pain.    . Multiple Vitamin (MULTIVITAMIN WITH MINERALS) TABS tablet Take 1 tablet by mouth daily.    Vladimir Faster Glycol-Propyl Glycol (SYSTANE OP) Place 1 drop into both eyes daily as needed (Dry Eyes).     No current facility-administered medications for this visit.     Past Medical History:  Diagnosis Date  . Anxiety   . Arthritis   . Colon polyps   . Hypercholesteremia   . Neuropathy     Past Surgical History:  Procedure Laterality Date  . APPENDECTOMY    . CHOLECYSTECTOMY    . COLONOSCOPY N/A 05/09/2013   Procedure: COLONOSCOPY;  Surgeon: Rogene Houston, MD;  Location: [facility];  Service: Endoscopy;  Laterality: N/A;  1030  . COLONOSCOPY W/ BIOPSIES AND POLYPECTOMY    . Left knee arthroscopy    . Left shoulder arthroscopy     X 2    Social History   Social History  . Marital status: Married    Spouse name: N/A  . Number of children: N/A  . Years of education: N/A   Occupational History  . Not on file.   Social History Main Topics  . Smoking status: Former Smoker    Packs/day: 1.00    Years: 40.00  Types: Cigarettes  . Smokeless tobacco: Never Used  . Alcohol use Yes     Comment: "Socially"  . Drug use: No  . Sexual activity: Not on file   Other Topics Concern  . Not on file   Social History Narrative  . No narrative on file     No family history of premature CAD in 1st degree relatives.  Current Meds  Medication Sig  . ALPRAZolam (XANAX) 1 MG tablet Take 1 mg by mouth at bedtime as needed for sleep.   Marland Kitchen atorvastatin (LIPITOR) 20 MG tablet Take 20 mg by mouth daily.  . Coenzyme Q10 200 MG capsule Take 200 mg by mouth daily.  . Flaxseed, Linseed, (FLAXSEED OIL) 1200 MG CAPS Take 1 capsule by mouth daily.  Marland Kitchen  HYDROcodone-acetaminophen (NORCO) 10-325 MG per tablet Take 1 tablet by mouth every 6 (six) hours as needed for moderate pain.  . Multiple Vitamin (MULTIVITAMIN WITH MINERALS) TABS tablet Take 1 tablet by mouth daily.  Vladimir Faster Glycol-Propyl Glycol (SYSTANE OP) Place 1 drop into both eyes daily as needed (Dry Eyes).      Review of systems complete and found to be negative unless listed above in HPI    Physical exam Blood pressure 112/66, pulse 79, height 5\' 10"  (1.778 m), weight 159 lb (72.1 kg), SpO2 98 %. General: NAD Neck: No JVD, no thyromegaly or thyroid nodule.  Lungs: Clear to auscultation bilaterally with normal respiratory effort. CV: Nondisplaced PMI. Regular rate and rhythm, normal S1/S2, no S3/S4, no murmur.  No peripheral edema.  No carotid bruit.    Abdomen: Soft, nontender, no distention.  Skin: Intact without lesions or rashes.  Neurologic: Alert and oriented x 3.  Psych: Normal affect. Extremities: No clubbing or cyanosis.  HEENT: Normal.   ECG: Most recent ECG reviewed.   Labs: Lab Results  Component Value Date/Time   CREATININE 1.00 10/11/2013 02:47 PM     Lipids: No results found for: LDLCALC, LDLDIRECT, CHOL, TRIG, HDL      ASSESSMENT AND PLAN:  1. Exertional dyspnea and fatigue: He has no history of pulmonary disease. Symptoms are certainly suspicious for ischemic heart disease. Risk factors include hyperlipidemia. He has severe bilateral knee arthritis limiting his ability to do any significant walking. I will proceed with a nuclear myocardial perfusion imaging study to evaluate for ischemic heart disease (Lexiscan Myoview).  2. Hyperlipidemia: Lipids reviewed above. Continue Lipitor 20 mg.    Disposition: Follow up in 1 month  Signed: Kate Sable, M.D., F.A.C.C.  03/30/2017, 8:43 AM

## 2017-04-11 ENCOUNTER — Encounter (HOSPITAL_COMMUNITY): Payer: Self-pay

## 2017-04-11 ENCOUNTER — Encounter (HOSPITAL_BASED_OUTPATIENT_CLINIC_OR_DEPARTMENT_OTHER)
Admission: RE | Admit: 2017-04-11 | Discharge: 2017-04-11 | Disposition: A | Discharge status: Home / Self Care | Payer: Medicare HMO | Source: Ambulatory Visit | Attending: Cardiovascular Disease | Admitting: Cardiovascular Disease

## 2017-04-11 ENCOUNTER — Encounter (HOSPITAL_COMMUNITY)
Admission: RE | Admit: 2017-04-11 | Discharge: 2017-04-11 | Disposition: A | Discharge status: Home / Self Care | Payer: Medicare HMO | Source: Ambulatory Visit | Attending: Cardiovascular Disease | Admitting: Cardiovascular Disease

## 2017-04-11 DIAGNOSIS — R5383 Other fatigue: Secondary | ICD-10-CM

## 2017-04-11 DIAGNOSIS — R0609 Other forms of dyspnea: Secondary | ICD-10-CM

## 2017-04-11 LAB — NM MYOCAR MULTI W/SPECT W/WALL MOTION / EF
CSEPPHR: 109 {beats}/min
LV dias vol: 76 mL (ref 62–150)
LV sys vol: 31 mL
RATE: 0.39
Rest HR: 62 {beats}/min
SDS: 1
SRS: 3
SSS: 4
TID: 0.92

## 2017-04-11 MED ORDER — TECHNETIUM TC 99M TETROFOSMIN IV KIT
10.0000 | PACK | Freq: Once | INTRAVENOUS | Status: AC | PRN
  Administered 2017-04-11: 10.3 via INTRAVENOUS

## 2017-04-11 MED ORDER — SODIUM CHLORIDE 0.9% FLUSH
INTRAVENOUS | Status: AC
  Administered 2017-04-11: 10 mL via INTRAVENOUS
  Filled 2017-04-11: qty 10

## 2017-04-11 MED ORDER — REGADENOSON 0.4 MG/5ML IV SOLN
INTRAVENOUS | Status: AC
  Administered 2017-04-11: 0.4 mg via INTRAVENOUS
  Filled 2017-04-11: qty 5

## 2017-04-11 MED ORDER — TECHNETIUM TC 99M TETROFOSMIN IV KIT
30.0000 | PACK | Freq: Once | INTRAVENOUS | Status: AC | PRN
  Administered 2017-04-11: 31 via INTRAVENOUS

## 2017-04-21 ENCOUNTER — Encounter: Payer: Self-pay | Admitting: Cardiovascular Disease

## 2017-04-21 ENCOUNTER — Ambulatory Visit (INDEPENDENT_AMBULATORY_CARE_PROVIDER_SITE_OTHER): Payer: Medicare HMO | Admitting: Cardiovascular Disease

## 2017-04-21 VITALS — BP 114/68 | HR 74 | Ht 71.0 in | Wt 163.0 lb

## 2017-04-21 DIAGNOSIS — M1990 Unspecified osteoarthritis, unspecified site: Secondary | ICD-10-CM | POA: Diagnosis not present

## 2017-04-21 DIAGNOSIS — R0789 Other chest pain: Secondary | ICD-10-CM

## 2017-04-21 DIAGNOSIS — R5383 Other fatigue: Secondary | ICD-10-CM

## 2017-04-21 DIAGNOSIS — R0609 Other forms of dyspnea: Secondary | ICD-10-CM

## 2017-04-21 DIAGNOSIS — R0602 Shortness of breath: Secondary | ICD-10-CM

## 2017-04-21 MED ORDER — METOPROLOL TARTRATE 25 MG PO TABS: 25.0000 mg | ORAL_TABLET | Freq: Two times a day (BID) | ORAL | 3 refills | Status: DC

## 2017-04-21 NOTE — Progress Notes (Signed)
SUBJECTIVE: The patient returns for follow-up after undergoing cardiovascular testing performed for the evaluation of exertional dyspnea and fatigue.  Nuclear stress testing had inferior wall defects which appeared to be consistent with soft tissue attenuation artifact.  However, myocardial scar could not entirely be ruled out.  There was no evidence of ischemia.  Left ventricular ejection fraction was normal, EF 60%.  Overall, it was a low risk study.  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  He tells me he does not like using opiates.  He continues to experience exertional dyspnea.  He has episodic chest tightness lasting less than a minute.  Yesterday he cut wood and dragged tree limbs and had no shortness of breath.  Today he has not done much but does have some shortness of breath.   Review of Systems: As per "subjective", otherwise negative.  Allergies  Allergen Reactions  . Penicillins Itching and Rash    Current Outpatient Prescriptions  Medication Sig Dispense Refill  . ALPRAZolam (XANAX) 1 MG tablet Take 1 mg by mouth at bedtime as needed for sleep.     Marland Kitchen atorvastatin (LIPITOR) 20 MG tablet Take 20 mg by mouth daily.    . Coenzyme Q10 200 MG capsule Take 200 mg by mouth daily.    . Flaxseed, Linseed, (FLAXSEED OIL) 1200 MG CAPS Take 1 capsule by mouth daily.    Marland Kitchen HYDROcodone-acetaminophen (NORCO) 10-325 MG per tablet Take 1 tablet by mouth every 6 (six) hours as needed for moderate pain.    . Multiple Vitamin (MULTIVITAMIN WITH MINERALS) TABS tablet Take 1 tablet by mouth daily.    Vladimir Faster Glycol-Propyl Glycol (SYSTANE OP) Place 1 drop into both eyes daily as needed (Dry Eyes).     No current facility-administered medications for this visit.     Past Medical History:  Diagnosis Date  . Anxiety   . Arthritis   . Colon polyps   . Hypercholesteremia   . Neuropathy     Past Surgical History:  Procedure Laterality Date  . APPENDECTOMY     . CHOLECYSTECTOMY    . COLONOSCOPY N/A 05/09/2013   Procedure: COLONOSCOPY;  Surgeon: Rogene Houston, MD;  Location: [facility];  Service: Endoscopy;  Laterality: N/A;  1030  . COLONOSCOPY W/ BIOPSIES AND POLYPECTOMY    . Left knee arthroscopy    . Left shoulder arthroscopy     X 2    Social History   Social History  . Marital status: Married    Spouse name: N/A  . Number of children: N/A  . Years of education: N/A   Occupational History  . Not on file.   Social History Main Topics  . Smoking status: Former Smoker    Packs/day: 1.00    Years: 40.00    Types: Cigarettes    Quit date: 06/21/1988  . Smokeless tobacco: Never Used  . Alcohol use Yes     Comment: "Socially"  . Drug use: No  . Sexual activity: Not on file   Other Topics Concern  . Not on file   Social History Narrative  . No narrative on file     Vitals:   04/21/17 1313  BP: 114/68  Pulse: 74  SpO2: 98%  Weight: 163 lb (73.9 kg)  Height: 5\' 11"  (1.803 m)    Wt Readings from Last 3 Encounters:  04/21/17 163 lb (73.9 kg)  03/30/17 159 lb (72.1 kg)  05/09/13 165 lb (74.8  kg)     PHYSICAL EXAM General: NAD HEENT: Normal. Neck: No JVD, no thyromegaly. Lungs: Clear to auscultation bilaterally with normal respiratory effort. CV: Regular rate and rhythm, normal S1/S2, no S3/S4, no murmur. No pretibial or periankle edema. Abdomen: Soft, nontender, no distention.  Neurologic: Alert and oriented.  Psych: Normal affect. Skin: Normal. Musculoskeletal: No gross deformities.    ECG: Most recent ECG reviewed.   Labs: Lab Results  Component Value Date/Time   CREATININE 1.00 10/11/2013 02:47 PM     Lipids: No results found for: LDLCALC, LDLDIRECT, CHOL, TRIG, HDL     ASSESSMENT AND PLAN: 1. Exertional dyspnea and fatigue: He has no history of pulmonary disease. Symptoms are certainly suspicious for ischemic heart disease. Risk factors include hyperlipidemia. Nuclear stress test was low  risk as detailed above with several inferior wall defects which appear to be due to soft tissue attenuation artifact; although myocardial scar could not entirely be ruled out.  There were no ischemic territories.  It was indeed a low risk study.   I will initially attempt medical therapy for symptom relief with metoprolol 25 mg twice daily.  2. Hyperlipidemia: Lipids previously reviewed. Continue Lipitor 20 mg.  3.  Diffuse osteoarthritic pain: Symptoms prevent him from sleeping on many nights.  It causes him to feel fatigued.  I will try duloxetine 30 mg daily for 1 week and then increase to 60 mg daily.     Disposition: Follow up 2 months.   Kate Sable, M.D., F.A.C.C.

## 2017-04-21 NOTE — Patient Instructions (Signed)
Your physician recommends that you schedule a follow-up appointment in:  2 months with Dr.Koneswaran    START metoprolol 25 mg twice a day    START Duloxetine 30 mg daily for 1 week, THEN INCREASE to 60 mg daily     No lab work or tests ordered today.      Thank you for choosing [facility] !

## 2017-05-23 ENCOUNTER — Telehealth: Payer: Self-pay | Admitting: Cardiovascular Disease

## 2017-05-23 NOTE — Telephone Encounter (Signed)
°*  STAT* If patient is at the pharmacy, call can be transferred to refill team.   1. Which medications need to be refilled? (please list name of each medication and dose if known)  DULoxetine (CYMBALTA) 60 MG capsule [[id number]]   2. Which pharmacy/location (including street and city if local pharmacy) is medication to be sent to? [facility]  3. Do they need a 30 day or 90 day supply?  90 day   Pt has 4 pills left

## 2017-05-23 NOTE — Telephone Encounter (Signed)
Pt will call PCP for further refills

## 2017-06-26 ENCOUNTER — Ambulatory Visit (INDEPENDENT_AMBULATORY_CARE_PROVIDER_SITE_OTHER): Payer: Medicare HMO | Admitting: Cardiovascular Disease

## 2017-06-26 ENCOUNTER — Encounter: Payer: Self-pay | Admitting: Cardiovascular Disease

## 2017-06-26 VITALS — BP 118/72 | HR 68 | Ht 71.0 in | Wt 158.0 lb

## 2017-06-26 DIAGNOSIS — R0609 Other forms of dyspnea: Secondary | ICD-10-CM | POA: Diagnosis not present

## 2017-06-26 DIAGNOSIS — R0602 Shortness of breath: Secondary | ICD-10-CM

## 2017-06-26 DIAGNOSIS — R0789 Other chest pain: Secondary | ICD-10-CM

## 2017-06-26 DIAGNOSIS — M1990 Unspecified osteoarthritis, unspecified site: Secondary | ICD-10-CM | POA: Diagnosis not present

## 2017-06-26 DIAGNOSIS — R5383 Other fatigue: Secondary | ICD-10-CM

## 2017-06-26 NOTE — Progress Notes (Signed)
SUBJECTIVE: The patient presents for routine follow-up.  Symptoms include exertional dyspnea, fatigue, and diffuse osteoarthritic pain.  Nuclear stress testing on 04/11/17 had inferior wall defects which appeared to be consistent with soft tissue attenuation artifact.  However, myocardial scar could not entirely be ruled out.  There was no evidence of ischemia.  Left ventricular ejection fraction was normal, EF 60%.  Overall, it was a low risk study.  He has not been able to be very active because of the weather.  He has not had any shortness of breath with inactivity.  He thinks the duloxetine has helped his cervical spine range of motion to some degree.  He has significant bilateral knee pain.  He denies orthopnea and leg swelling as well as chest pain.  I reviewed his blood pressure log which demonstrates normal blood pressures.  He has not had any dizziness or lightheadedness with metoprolol.    Review of Systems: As per "subjective", otherwise negative.  Allergies  Allergen Reactions  . Penicillins Itching and Rash    Current Outpatient Medications  Medication Sig Dispense Refill  . ALPRAZolam (XANAX) 1 MG tablet Take 1 mg by mouth at bedtime as needed for sleep.     Marland Kitchen atorvastatin (LIPITOR) 20 MG tablet Take 20 mg by mouth daily.    . Coenzyme Q10 200 MG capsule Take 200 mg by mouth daily.    . DULoxetine (CYMBALTA) 60 MG capsule Take 60 mg by mouth daily.    . Flaxseed, Linseed, (FLAXSEED OIL) 1200 MG CAPS Take 1 capsule by mouth daily.    Marland Kitchen HYDROcodone-acetaminophen (NORCO) 10-325 MG per tablet Take 1 tablet by mouth every 6 (six) hours as needed for moderate pain.    . metoprolol tartrate (LOPRESSOR) 25 MG tablet Take 1 tablet (25 mg total) by mouth 2 (two) times daily. 180 tablet 3  . Multiple Vitamin (MULTIVITAMIN WITH MINERALS) TABS tablet Take 1 tablet by mouth daily.    Vladimir Faster Glycol-Propyl Glycol (SYSTANE OP) Place 1 drop into both eyes daily as needed (Dry  Eyes).     No current facility-administered medications for this visit.     Past Medical History:  Diagnosis Date  . Anxiety   . Arthritis   . Colon polyps   . Hypercholesteremia   . Neuropathy     Past Surgical History:  Procedure Laterality Date  . APPENDECTOMY    . CHOLECYSTECTOMY    . COLONOSCOPY N/A 05/09/2013   Procedure: COLONOSCOPY;  Surgeon: Rogene Houston, MD;  Location: [facility];  Service: Endoscopy;  Laterality: N/A;  1030  . COLONOSCOPY W/ BIOPSIES AND POLYPECTOMY    . Left knee arthroscopy    . Left shoulder arthroscopy     X 2    Social History   Socioeconomic History  . Marital status: Married    Spouse name: Not on file  . Number of children: Not on file  . Years of education: Not on file  . Highest education level: Not on file  Social Needs  . Financial resource strain: Not on file  . Food insecurity - worry: Not on file  . Food insecurity - inability: Not on file  . Transportation needs - medical: Not on file  . Transportation needs - non-medical: Not on file  Occupational History  . Not on file  Tobacco Use  . Smoking status: Former Smoker    Packs/day: 1.00    Years: 40.00    Pack years: 40.00  Types: Cigarettes    Last attempt to quit: 06/21/1988    Years since quitting: 29.0  . Smokeless tobacco: Never Used  Substance and Sexual Activity  . Alcohol use: Yes    Comment: "Socially"  . Drug use: No  . Sexual activity: Not on file  Other Topics Concern  . Not on file  Social History Narrative  . Not on file     Vitals:   06/26/17 0813  BP: 118/72  Pulse: 68  SpO2: 94%  Weight: 158 lb (71.7 kg)  Height: 5\' 11"  (1.803 m)    Wt Readings from Last 3 Encounters:  06/26/17 158 lb (71.7 kg)  04/21/17 163 lb (73.9 kg)  03/30/17 159 lb (72.1 kg)     PHYSICAL EXAM General: NAD HEENT: Normal. Neck: No JVD, no thyromegaly. Lungs: Clear to auscultation bilaterally with normal respiratory effort. CV: Regular rate and  rhythm, normal S1/S2, no S3/S4, no murmur. No pretibial or periankle edema.  No carotid bruit.   Abdomen: Soft, nontender, no distention.  Neurologic: Alert and oriented.  Psych: Normal affect. Skin: Normal. Musculoskeletal: No gross deformities.    ECG: Most recent ECG reviewed.   Labs: Lab Results  Component Value Date/Time   CREATININE 1.00 10/11/2013 02:47 PM     Lipids: No results found for: LDLCALC, LDLDIRECT, CHOL, TRIG, HDL     ASSESSMENT AND PLAN: 1. Exertional dyspnea and fatigue: He has no history of pulmonary disease. Symptoms are certainly suspicious for ischemic heart disease. Risk factors include hyperlipidemia. Nuclear stress test was low risk as detailed above with several inferior wall defects which appear to be due to soft tissue attenuation artifact; although myocardial scar could not entirely be ruled out.  There were no ischemic territories.  It was indeed a low risk study.   I will continue to attempt medical therapy for symptom relief with metoprolol 25 mg twice daily.  Once the weather warms up and he is able to be more physically active, I will see if metoprolol has indeed helped improve symptoms.  2. Hyperlipidemia: Lipids previously reviewed. Continue Lipitor 20 mg.  3.  Diffuse osteoarthritic pain: There has been some symptom relief with regards to cervical spine range of motion.  I will continue duloxetine 60 mg daily.      Disposition: Follow up 6 months   Kate Sable, M.D., F.A.C.C.

## 2017-06-26 NOTE — Patient Instructions (Signed)
Your physician wants you to follow-up in: 6 months with Dr.Koneswaran You will receive a reminder letter in the mail two months in advance. If you don't receive a letter, please call our office to schedule the follow-up appointment.   Your physician recommends that you continue on your current medications as directed. Please refer to the Current Medication list given to you today.    If you need a refill on your cardiac medications before your next appointment, please call your pharmacy.     No lab work or tests ordered today.     Thank you for choosing [facility] !         

## 2017-09-12 DIAGNOSIS — S299XXA Unspecified injury of thorax, initial encounter: Secondary | ICD-10-CM | POA: Diagnosis not present

## 2017-09-12 DIAGNOSIS — R0781 Pleurodynia: Secondary | ICD-10-CM | POA: Diagnosis not present

## 2017-09-12 DIAGNOSIS — M79601 Pain in right arm: Secondary | ICD-10-CM | POA: Diagnosis not present

## 2017-12-26 ENCOUNTER — Ambulatory Visit (INDEPENDENT_AMBULATORY_CARE_PROVIDER_SITE_OTHER): Payer: Medicare HMO | Admitting: Cardiovascular Disease

## 2017-12-26 ENCOUNTER — Encounter: Payer: Self-pay | Admitting: Cardiovascular Disease

## 2017-12-26 VITALS — BP 116/78 | HR 62 | Ht 71.0 in | Wt 152.2 lb

## 2017-12-26 DIAGNOSIS — R0609 Other forms of dyspnea: Secondary | ICD-10-CM | POA: Diagnosis not present

## 2017-12-26 DIAGNOSIS — M1990 Unspecified osteoarthritis, unspecified site: Secondary | ICD-10-CM | POA: Diagnosis not present

## 2017-12-26 DIAGNOSIS — R5383 Other fatigue: Secondary | ICD-10-CM

## 2017-12-26 NOTE — Patient Instructions (Signed)
Medication Instructions:  STOP METOPROLOL   Labwork: NONE  Testing/Procedures: NONE  Follow-Up: Your physician recommends that you schedule a follow-up appointment in: AS NEEDED    Any Other Special Instructions Will Be Listed Below (If Applicable).     If you need a refill on your cardiac medications before your next appointment, please call your pharmacy.

## 2017-12-26 NOTE — Progress Notes (Signed)
SUBJECTIVE: The patient presents for routine follow-up.  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  Nuclear stress testing on 04/11/17 had inferior wall defects which appeared to be consistent with soft tissue attenuation artifact. However, myocardial scar could not entirely be ruled out. There was no evidence of ischemia. Left ventricular ejection fraction was normal, EF 60%. Overall, it was a low risk study.  He tries to stay as active as he can.  He only gets short of breath after walking a quarter of a mile.  He denies chest pain.  He did not feel metoprolol improved his symptoms.  I reviewed his blood pressure log which demonstrates normal blood pressures.      Review of Systems: As per "subjective", otherwise negative.  Allergies  Allergen Reactions  . Bee Venom Anaphylaxis  . Penicillins Itching and Rash    Current Outpatient Medications  Medication Sig Dispense Refill  . ALPRAZolam (XANAX) 1 MG tablet Take 1 mg by mouth at bedtime as needed for sleep.     Marland Kitchen atorvastatin (LIPITOR) 20 MG tablet Take 20 mg by mouth daily.    . Coenzyme Q10 200 MG capsule Take 200 mg by mouth daily.    . DULoxetine (CYMBALTA) 60 MG capsule Take 60 mg by mouth daily.    . Flaxseed, Linseed, (FLAXSEED OIL) 1200 MG CAPS Take 1 capsule by mouth daily.    Marland Kitchen HYDROcodone-acetaminophen (NORCO) 10-325 MG per tablet Take 1 tablet by mouth every 6 (six) hours as needed for moderate pain.    . metoprolol tartrate (LOPRESSOR) 25 MG tablet Take 1 tablet (25 mg total) by mouth 2 (two) times daily. 180 tablet 3  . Multiple Vitamin (MULTIVITAMIN WITH MINERALS) TABS tablet Take 1 tablet by mouth daily.    Vladimir Faster Glycol-Propyl Glycol (SYSTANE OP) Place 1 drop into both eyes daily as needed (Dry Eyes).     No current facility-administered medications for this visit.     Past Medical History:  Diagnosis Date  . Anxiety   . Arthritis   . Colon  polyps   . Hypercholesteremia   . Neuropathy     Past Surgical History:  Procedure Laterality Date  . APPENDECTOMY    . CHOLECYSTECTOMY    . COLONOSCOPY N/A 05/09/2013   Procedure: COLONOSCOPY;  Surgeon: Rogene Houston, MD;  Location: [facility];  Service: Endoscopy;  Laterality: N/A;  1030  . COLONOSCOPY W/ BIOPSIES AND POLYPECTOMY    . Left knee arthroscopy    . Left shoulder arthroscopy     X 2    Social History   Socioeconomic History  . Marital status: Married    Spouse name: Not on file  . Number of children: Not on file  . Years of education: Not on file  . Highest education level: Not on file  Occupational History  . Not on file  Social Needs  . Financial resource strain: Not on file  . Food insecurity:    Worry: Not on file    Inability: Not on file  . Transportation needs:    Medical: Not on file    Non-medical: Not on file  Tobacco Use  . Smoking status: Former Smoker    Packs/day: 1.00    Years: 40.00    Pack years: 40.00    Types: Cigarettes    Last attempt to quit: 06/21/1988    Years since quitting: 29.5  . Smokeless tobacco:  Never Used  Substance and Sexual Activity  . Alcohol use: Yes    Comment: "Socially"  . Drug use: No  . Sexual activity: Not on file  Lifestyle  . Physical activity:    Days per week: Not on file    Minutes per session: Not on file  . Stress: Not on file  Relationships  . Social connections:    Talks on phone: Not on file    Gets together: Not on file    Attends religious service: Not on file    Active member of club or organization: Not on file    Attends meetings of clubs or organizations: Not on file    Relationship status: Not on file  . Intimate partner violence:    Fear of current or ex partner: Not on file    Emotionally abused: Not on file    Physically abused: Not on file    Forced sexual activity: Not on file  Other Topics Concern  . Not on file  Social History Narrative  . Not on file      Vitals:   12/26/17 0822  BP: 116/78  Pulse: 62  SpO2: 98%  Weight: 152 lb 3.2 oz (69 kg)  Height: 5\' 11"  (1.803 m)    Wt Readings from Last 3 Encounters:  12/26/17 152 lb 3.2 oz (69 kg)  06/26/17 158 lb (71.7 kg)  04/21/17 163 lb (73.9 kg)     PHYSICAL EXAM General: NAD HEENT: Normal. Neck: No JVD, no thyromegaly. Lungs: Clear to auscultation bilaterally with normal respiratory effort. CV: Regular rate and rhythm, normal S1/S2, no S3/S4, no murmur. No pretibial or periankle edema.  No carotid bruit.   Abdomen: Soft, nontender, no distention.  Neurologic: Alert and oriented.  Psych: Normal affect. Skin: Normal. Musculoskeletal: No gross deformities.    ECG: Reviewed above under Subjective   Labs: Lab Results  Component Value Date/Time   CREATININE 1.00 10/11/2013 02:47 PM     Lipids: No results found for: LDLCALC, LDLDIRECT, CHOL, TRIG, HDL     ASSESSMENT AND PLAN:  1.  Exertional dyspnea and fatigue: He is able to walk a quarter of a mile without having to stop due to shortness of breath.  He denies chest pain altogether.  Nuclear stress test was low risk as detailed above.  As metoprolol has not helped to improve his symptoms, I will discontinue this.  2.  Hyperlipidemia: Lipids previously reviewed.  He has questions about coming off of Lipitor.  I told him to have a discussion with his PCP as lipids will need monitoring should he discontinue it.  3.  Diffuse osteoarthritic pain: He previously told me he had some symptom relief with cervical spine range of motion with duloxetine 60 mg daily.  This can be continued and refilled by his PCP.   Disposition: Follow up as needed   Kate Sable, M.D., F.A.C.C.

## 2018-01-31 DIAGNOSIS — Z0100 Encounter for examination of eyes and vision without abnormal findings: Secondary | ICD-10-CM | POA: Diagnosis not present

## 2018-01-31 DIAGNOSIS — H26492 Other secondary cataract, left eye: Secondary | ICD-10-CM | POA: Diagnosis not present

## 2018-03-05 DIAGNOSIS — Z125 Encounter for screening for malignant neoplasm of prostate: Secondary | ICD-10-CM | POA: Diagnosis not present

## 2018-03-05 DIAGNOSIS — M545 Low back pain: Secondary | ICD-10-CM | POA: Diagnosis not present

## 2018-03-05 DIAGNOSIS — M159 Polyosteoarthritis, unspecified: Secondary | ICD-10-CM | POA: Diagnosis not present

## 2018-03-05 DIAGNOSIS — F329 Major depressive disorder, single episode, unspecified: Secondary | ICD-10-CM | POA: Diagnosis not present

## 2018-03-05 DIAGNOSIS — R0789 Other chest pain: Secondary | ICD-10-CM | POA: Diagnosis not present

## 2018-03-05 DIAGNOSIS — Z23 Encounter for immunization: Secondary | ICD-10-CM | POA: Diagnosis not present

## 2018-03-05 DIAGNOSIS — M5136 Other intervertebral disc degeneration, lumbar region: Secondary | ICD-10-CM | POA: Diagnosis not present

## 2018-03-05 DIAGNOSIS — Z0000 Encounter for general adult medical examination without abnormal findings: Secondary | ICD-10-CM | POA: Diagnosis not present

## 2018-03-05 DIAGNOSIS — R0602 Shortness of breath: Secondary | ICD-10-CM | POA: Diagnosis not present

## 2018-04-24 DIAGNOSIS — E559 Vitamin D deficiency, unspecified: Secondary | ICD-10-CM | POA: Diagnosis not present

## 2018-04-24 DIAGNOSIS — M456 Ankylosing spondylitis lumbar region: Secondary | ICD-10-CM | POA: Diagnosis not present

## 2018-04-24 DIAGNOSIS — E785 Hyperlipidemia, unspecified: Secondary | ICD-10-CM | POA: Diagnosis not present

## 2018-04-24 DIAGNOSIS — R5383 Other fatigue: Secondary | ICD-10-CM | POA: Diagnosis not present

## 2018-04-24 DIAGNOSIS — M130 Polyarthritis, unspecified: Secondary | ICD-10-CM | POA: Diagnosis not present

## 2018-05-24 DIAGNOSIS — M130 Polyarthritis, unspecified: Secondary | ICD-10-CM | POA: Diagnosis not present

## 2018-05-24 DIAGNOSIS — E559 Vitamin D deficiency, unspecified: Secondary | ICD-10-CM | POA: Diagnosis not present

## 2018-05-24 DIAGNOSIS — R5383 Other fatigue: Secondary | ICD-10-CM | POA: Diagnosis not present

## 2018-06-28 DIAGNOSIS — Z961 Presence of intraocular lens: Secondary | ICD-10-CM | POA: Diagnosis not present

## 2018-06-28 DIAGNOSIS — Z9841 Cataract extraction status, right eye: Secondary | ICD-10-CM | POA: Diagnosis not present

## 2018-06-28 DIAGNOSIS — H11153 Pinguecula, bilateral: Secondary | ICD-10-CM | POA: Diagnosis not present

## 2018-06-28 DIAGNOSIS — H43812 Vitreous degeneration, left eye: Secondary | ICD-10-CM | POA: Diagnosis not present

## 2018-07-13 NOTE — Progress Notes (Signed)
Office Visit Note  Patient: Scott Chang             Date of Birth: January 02, 1941           MRN: [id number]             PCP: Doree Albee, MD Referring: Doree Albee, MD Visit Date: 07/27/2018 Occupation: Retired, Glass blower/designer  Subjective:  Pain in multiple joints.   History of Present Illness: Scott Chang is a 78 y.o. male seen in consultation per request of his PCP.  According to patient his symptoms are started about 7 to 8 months ago with pain in his both hands and both wrists.  He states he initially had some swelling and then swelling improved.  All of the pain in his hands has been getting worse.  He has difficulty making a fist and doing routine activities.  He has difficulty grooming himself and brushing.  He also complains of pain in his bilateral elbows and shoulders.  He denies any discomfort in his hip joints.  He has some discomfort in his knee joints.  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  He is not taking any treatment for that.  He had left knee joint arthroscopic surgery about 12 years ago and was diagnosed with osteoarthritis.  He also had left rotator cuff tear repair x2 on his left shoulder in the past.  He denies any muscular weakness.  Activities of Daily Living:  Patient reports morning stiffness for 24 hours.   Patient Reports nocturnal pain.  In bilateral shoulders Difficulty dressing/grooming: Reports Difficulty climbing stairs: Reports due to knee joint pain Difficulty getting out of chair: Reports due to knee joint pain Difficulty using hands for taps, buttons, cutlery, and/or writing: Reports  Review of Systems  Constitutional: Negative for fatigue and night sweats.  HENT: Negative for mouth sores, mouth dryness and nose dryness.   Eyes: Negative for redness, itching and dryness.  Respiratory: Negative for shortness of breath, wheezing and difficulty breathing.    Cardiovascular: Negative for chest pain, palpitations, hypertension, irregular heartbeat and swelling in legs/feet.  Gastrointestinal: Negative for abdominal pain, constipation and diarrhea.  Endocrine: Negative for increased urination.  Genitourinary: Negative for painful urination and urgency.  Musculoskeletal: Positive for arthralgias, joint pain, joint swelling and morning stiffness. Negative for myalgias, muscle weakness, muscle tenderness and myalgias.  Skin: Negative for color change, rash, hair loss, nodules/bumps, redness, skin tightness, ulcers and sensitivity to sunlight.  Allergic/Immunologic: Negative for susceptible to infections.  Neurological: Positive for light-headedness. Negative for dizziness, fainting, headaches, memory loss, night sweats and weakness.  Hematological: Positive for bruising/bleeding tendency. Negative for swollen glands.  Psychiatric/Behavioral: Positive for sleep disturbance. Negative for depressed mood and confusion. The patient is nervous/anxious.     PMFS History:  Patient Active Problem List   Diagnosis Date Noted  . Other fatigue 07/27/2018  . History of anxiety 07/27/2018  . History of colonic polyps 07/27/2018  . History of hyperlipidemia 07/27/2018  . Vitamin D deficiency 07/27/2018  . Neuropathy 07/27/2018  . DDD (degenerative disc disease), cervical 07/27/2018  . DDD (degenerative disc disease), lumbar 07/27/2018  . Primary osteoarthritis of both hands 07/27/2018    Past Medical History:  Diagnosis Date  . Anxiety   . Arthritis   . Colon polyps   . Hypercholesteremia   . Neuropathy     Family History  Problem Relation Age of Onset  . Chronic Renal Failure Mother   . Heart attack Father   . Cancer Brother   . Osteoarthritis Daughter   . Colon cancer Neg Hx    Past Surgical History:  Procedure Laterality Date  . APPENDECTOMY    . CHOLECYSTECTOMY    . COLONOSCOPY N/A 05/09/2013   Procedure: COLONOSCOPY;  Surgeon: Rogene Houston, MD;  Location: [facility];  Service: Endoscopy;  Laterality: N/A;  1030  . COLONOSCOPY W/ BIOPSIES AND POLYPECTOMY    . Left knee arthroscopy    . Left shoulder arthroscopy     X 2   Social History   Social History Narrative  . Not on file    There is no immunization history on file for this patient.   Objective: Vital Signs: BP 131/83 (BP Location: Right Arm, Patient Position: Sitting, Cuff Size: Normal)   Pulse 75   Resp 14   Ht 5' 10.75" (1.797 m)   Wt 157 lb (71.2 kg)   BMI 22.05 kg/m    Physical Exam Vitals signs and nursing note reviewed.  Constitutional:      Appearance: He is well-developed.  HENT:     Head: Normocephalic and atraumatic.  Eyes:     Conjunctiva/sclera: Conjunctivae normal.     Pupils: Pupils are equal, round, and reactive to light.  Neck:     Musculoskeletal: Normal range of motion and neck supple.  Cardiovascular:     Rate and Rhythm: Normal rate and regular rhythm.     Heart sounds: Normal heart sounds.  Pulmonary:     Effort: Pulmonary effort is normal.     Breath sounds: Normal breath sounds.  Abdominal:     General: Bowel sounds are normal.     Palpations: Abdomen is soft.  Skin:    General: Skin is warm and dry.     Capillary Refill: Capillary refill takes less than 2 seconds.  Neurological:     Mental Status: He is alert and oriented to person, place, and time.  Psychiatric:        Behavior: Behavior normal.      Musculoskeletal Exam: C-spine and lumbar spine limited range of motion.  Shoulder joints elbow joints wrist joints were in good range of motion.  He is in complete fist formation.  He had some swelling over MCPs and PIPs.  Thickening of PIP and DIP joints was noted.  Hip joints were in good range of motion.  He had discomfort range of motion of bilateral knee joints.  He has bilateral hammertoes but no synovitis was noted over his feet.  Has difficulty getting out of the chair due to knee joint discomfort.  CDAI  Exam: CDAI Score: Not documented Patient Global Assessment: Not documented; Provider Global Assessment: Not documented Swollen: Not documented; Tender: Not documented Joint Exam   Not documented   There is currently no information documented on the homunculus. Go to the Rheumatology activity and complete the homunculus joint exam.  Investigation: Findings:  04/24/18: ANA negative, RF<14, HLA-B27 negative, Scl-70 negative, RNP-, dsDNA-, CRP 5.4, CBC and CMP WNL   Imaging: Xr Foot 2 Views Left  Result Date: 07/27/2018 PIP, DIP and first MTP narrowing was noted.  No intertarsal, tibiotalar or subtalar joint space narrowing was noted.  Small posterior inferior calcaneal spurs were noted.  No erosive changes were noted. Impression: These findings are consistent with osteoarthritis of the foot.  Xr Foot 2 Views Right  Result Date:  07/27/2018 PIP, DIP and first MTP narrowing was noted.  No intertarsal, tibiotalar or subtalar joint space narrowing was noted.  Small posterior inferior calcaneal spurs were noted.  No erosive changes were noted. Impression: These findings are consistent with osteoarthritis of the foot.  Xr Hand 2 View Left  Result Date: 07/27/2018 Juxta-articular osteopenia was noted.  PIP and DIP narrowing was noted.  Third MCP narrowing was noted.  CMC narrowing was noted.  No metacarpocarpal intercarpal joint space narrowing was noted.  Cystic versus erosive changes were noted in the carpal bones. 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  Xr Hand 2 View Right  Result Date: 07/27/2018 PIP and DIP narrowing was noted.  Third MCP joint narrowing was noted.  Juxta-articular osteopenia was noted.  No intercarpal, radiocarpal or metacarpal carpal joint space narrowing was noted.  No erosive changes were noted. 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  Xr Knee 3 View  Left  Result Date: 07/27/2018 Severe medial compartment narrowing was noted.  Medial osteophytes were noted.  No chondrocalcinosis was noted.  Severe patellofemoral narrowing was noted. 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  Xr Knee 3 View Right  Result Date: 07/27/2018 Severe medial compartment narrowing was noted.  No chondrocalcinosis was noted.  Severe patellofemoral narrowing was noted. 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  Xr Shoulder Left  Result Date: 07/27/2018 No glenohumeral joint space narrowing was noted.  Postsurgical changes were noted in the left distal clavicle.  No chondrocalcinosis was noted.  Xr Shoulder Right  Result Date: 07/27/2018 No glenohumeral joint space narrowing was noted.  No acromioclavicular joint space narrowing was noted.  No chondrocalcinosis was noted.   Recent Labs: Lab Results  Component Value Date   CREATININE 1.00 10/11/2013    Speciality Comments: No specialty comments available.  Procedures:  No procedures performed Allergies: Bee venom and Penicillins   Assessment / Plan:     Visit Diagnoses: Polyarthritis - 04/24/18: ANA negative, RF<14, HLA-B27 negative, Scl-70 negative, RNP-, dsDNA-, CRP 5.4, CBC and CMP WNL.  Patient complains of pain in multiple joints.  Pain in both hands -he had some swelling of the bilateral second and third MCP joints and PIP joints.  He is in complete fist formation with discomfort.  I will obtain following x-rays and labs today.  Plan: XR Hand 2 View Right, XR Hand 2 View Left, x-rays were consistent with inflammatory arthritis and osteoarthritis overlap.  Sedimentation rate, Cyclic citrul peptide antibody, IgG, 14-3-3 eta Protein, Uric acid  Primary osteoarthritis of both hands-clinical findings also consistent with osteoarthritis of both hands.  Chronic pain of both shoulders -he  has nocturnal pain in his shoulders.  Plan: XR Shoulder Left, XR Shoulder Right.  The left shoulder joint show postsurgical changes in the right shoulder joint was within normal limits.  Chronic pain of both knees -he has discomfort with walking and doing routine activities due to knee joint pain.  No warmth swelling or effusion was noted.  Plan: XR KNEE 3 VIEW RIGHT, XR KNEE 3 VIEW LEFT.  Bilateral knee joint showed severe medial compartment narrowing and chondromalacia patella.  Pain in both feet -clinical findings are consistent with osteoarthritis in his feet.  Plan: XR Foot 2 Views Right, XR Foot 2 Views Left.  X-ray was consistent with osteoarthritis.  High risk medication use - Plan: CBC with Differential/Platelet, COMPLETE METABOLIC PANEL  WITH GFR, Hepatitis B core antibody, IgM, Hepatitis B surface antigen, Hepatitis C antibody, QuantiFERON-TB Gold Plus, Serum protein electrophoresis with reflex, IgG, IgA, IgM, Glucose 6 phosphate dehydrogenase  DDD (degenerative disc disease), cervical-patient gives history of disc disease in cervical spine.  He has limited range of motion.  DDD (degenerative disc disease), lumbar-he gives history of degenerative disease of lumbar spine.  He has limited range of motion.  Neuropathy-he gives history of neuropathy for the last few years.  He has been seen by neurologist in the past.  Vitamin D deficiency  History of hyperlipidemia  History of anxiety  Other fatigue - Plan: CK, TSH   Orders: Orders Placed This Encounter  Procedures  . XR Shoulder Left  . XR Shoulder Right  . XR Hand 2 View Right  . XR Hand 2 View Left  . XR KNEE 3 VIEW RIGHT  . XR KNEE 3 VIEW LEFT  . XR Foot 2 Views Right  . XR Foot 2 Views Left  . CBC with Differential/Platelet  . COMPLETE METABOLIC PANEL WITH GFR  . CK  . TSH  . Sedimentation rate  . Cyclic citrul peptide antibody, IgG  . 14-3-3 eta Protein  . Uric acid  . Hepatitis B core antibody, IgM  . Hepatitis  B surface antigen  . Hepatitis C antibody  . QuantiFERON-TB Gold Plus  . Serum protein electrophoresis with reflex  . IgG, IgA, IgM  . Glucose 6 phosphate dehydrogenase   Meds ordered this encounter  Medications  . predniSONE (DELTASONE) 5 MG tablet    Sig: Take 4 tablets (20 mg total) by mouth daily for 4 days, THEN 3 tablets (15 mg total) daily for 4 days, THEN 2 tablets (10 mg total) daily for 4 days, THEN 1 tablet (5 mg total) daily for 4 days, THEN 0.5 tablets (2.5 mg total) daily for 4 days.    Dispense:  42 tablet    Refill:  0    Face-to-face time spent with patient was over 60 minutes. Greater than 50% of time was spent in counseling and coordination of care.  Follow-Up Instructions: No follow-ups on file.   Bo Merino, MD  Note - This record has been created using Editor, commissioning.  Chart creation errors have been sought, but may not always  have been located. Such creation errors do not reflect on  the standard of medical care.

## 2018-07-25 DIAGNOSIS — E559 Vitamin D deficiency, unspecified: Secondary | ICD-10-CM | POA: Diagnosis not present

## 2018-07-25 DIAGNOSIS — R5383 Other fatigue: Secondary | ICD-10-CM | POA: Diagnosis not present

## 2018-07-25 DIAGNOSIS — E785 Hyperlipidemia, unspecified: Secondary | ICD-10-CM | POA: Diagnosis not present

## 2018-07-25 DIAGNOSIS — R6882 Decreased libido: Secondary | ICD-10-CM | POA: Diagnosis not present

## 2018-07-25 DIAGNOSIS — M130 Polyarthritis, unspecified: Secondary | ICD-10-CM | POA: Diagnosis not present

## 2018-07-27 ENCOUNTER — Ambulatory Visit (INDEPENDENT_AMBULATORY_CARE_PROVIDER_SITE_OTHER): Payer: Medicare HMO

## 2018-07-27 ENCOUNTER — Ambulatory Visit (INDEPENDENT_AMBULATORY_CARE_PROVIDER_SITE_OTHER): Payer: Medicare HMO | Admitting: Rheumatology

## 2018-07-27 ENCOUNTER — Encounter (INDEPENDENT_AMBULATORY_CARE_PROVIDER_SITE_OTHER): Payer: Self-pay

## 2018-07-27 ENCOUNTER — Encounter: Payer: Self-pay | Admitting: Rheumatology

## 2018-07-27 VITALS — BP 131/83 | HR 75 | Resp 14 | Ht 70.75 in | Wt 157.0 lb

## 2018-07-27 DIAGNOSIS — M25562 Pain in left knee: Secondary | ICD-10-CM | POA: Diagnosis not present

## 2018-07-27 DIAGNOSIS — G8929 Other chronic pain: Secondary | ICD-10-CM

## 2018-07-27 DIAGNOSIS — M06041 Rheumatoid arthritis without rheumatoid factor, right hand: Secondary | ICD-10-CM

## 2018-07-27 DIAGNOSIS — Z79899 Other long term (current) drug therapy: Secondary | ICD-10-CM

## 2018-07-27 DIAGNOSIS — M79672 Pain in left foot: Secondary | ICD-10-CM

## 2018-07-27 DIAGNOSIS — M25511 Pain in right shoulder: Secondary | ICD-10-CM | POA: Diagnosis not present

## 2018-07-27 DIAGNOSIS — M19041 Primary osteoarthritis, right hand: Secondary | ICD-10-CM

## 2018-07-27 DIAGNOSIS — M79642 Pain in left hand: Secondary | ICD-10-CM

## 2018-07-27 DIAGNOSIS — M2242 Chondromalacia patellae, left knee: Secondary | ICD-10-CM

## 2018-07-27 DIAGNOSIS — M25561 Pain in right knee: Secondary | ICD-10-CM | POA: Diagnosis not present

## 2018-07-27 DIAGNOSIS — M79671 Pain in right foot: Secondary | ICD-10-CM

## 2018-07-27 DIAGNOSIS — R5382 Chronic fatigue, unspecified: Secondary | ICD-10-CM | POA: Insufficient documentation

## 2018-07-27 DIAGNOSIS — M130 Polyarthritis, unspecified: Secondary | ICD-10-CM | POA: Diagnosis not present

## 2018-07-27 DIAGNOSIS — M5136 Other intervertebral disc degeneration, lumbar region: Secondary | ICD-10-CM

## 2018-07-27 DIAGNOSIS — Z8659 Personal history of other mental and behavioral disorders: Secondary | ICD-10-CM

## 2018-07-27 DIAGNOSIS — M1712 Unilateral primary osteoarthritis, left knee: Secondary | ICD-10-CM

## 2018-07-27 DIAGNOSIS — Z860100 Personal history of colon polyps, unspecified: Secondary | ICD-10-CM

## 2018-07-27 DIAGNOSIS — M069 Rheumatoid arthritis, unspecified: Secondary | ICD-10-CM | POA: Diagnosis not present

## 2018-07-27 DIAGNOSIS — M25512 Pain in left shoulder: Secondary | ICD-10-CM

## 2018-07-27 DIAGNOSIS — M79641 Pain in right hand: Secondary | ICD-10-CM

## 2018-07-27 DIAGNOSIS — M51369 Other intervertebral disc degeneration, lumbar region without mention of lumbar back pain or lower extremity pain: Secondary | ICD-10-CM

## 2018-07-27 DIAGNOSIS — M1711 Unilateral primary osteoarthritis, right knee: Secondary | ICD-10-CM | POA: Diagnosis not present

## 2018-07-27 DIAGNOSIS — M06042 Rheumatoid arthritis without rheumatoid factor, left hand: Secondary | ICD-10-CM

## 2018-07-27 DIAGNOSIS — M5030 Other cervical disc degeneration, unspecified cervical region: Secondary | ICD-10-CM | POA: Diagnosis not present

## 2018-07-27 DIAGNOSIS — R5383 Other fatigue: Secondary | ICD-10-CM | POA: Diagnosis not present

## 2018-07-27 DIAGNOSIS — G629 Polyneuropathy, unspecified: Secondary | ICD-10-CM

## 2018-07-27 DIAGNOSIS — M19071 Primary osteoarthritis, right ankle and foot: Secondary | ICD-10-CM | POA: Diagnosis not present

## 2018-07-27 DIAGNOSIS — M2241 Chondromalacia patellae, right knee: Secondary | ICD-10-CM | POA: Diagnosis not present

## 2018-07-27 DIAGNOSIS — E559 Vitamin D deficiency, unspecified: Secondary | ICD-10-CM | POA: Insufficient documentation

## 2018-07-27 DIAGNOSIS — M19042 Primary osteoarthritis, left hand: Secondary | ICD-10-CM

## 2018-07-27 DIAGNOSIS — Z8639 Personal history of other endocrine, nutritional and metabolic disease: Secondary | ICD-10-CM

## 2018-07-27 HISTORY — DX: Personal history of colonic polyps: Z86.010

## 2018-07-27 HISTORY — DX: Other intervertebral disc degeneration, lumbar region without mention of lumbar back pain or lower extremity pain: M51.369

## 2018-07-27 HISTORY — DX: Other cervical disc degeneration, unspecified cervical region: M50.30

## 2018-07-27 HISTORY — DX: Other intervertebral disc degeneration, lumbar region: M51.36

## 2018-07-27 HISTORY — DX: Chronic fatigue, unspecified: R53.82

## 2018-07-27 HISTORY — DX: Personal history of colon polyps, unspecified: Z86.0100

## 2018-07-27 HISTORY — DX: Vitamin D deficiency, unspecified: E55.9

## 2018-07-27 MED ORDER — PREDNISONE 5 MG PO TABS: ORAL_TABLET | ORAL | 0 refills | Status: AC

## 2018-07-27 NOTE — Patient Instructions (Signed)
Prednisone Taper Instruction 20 mg (4 tablets) daily for 4 days 15 mg (3 tablets) daily for 4 days 10 mg (2 tablets) daily for 4 days 5 mg (1 tablet) daily for 4 days 2.5 mg (1/2 tablet) daily for 4 days  Prednisone tablets What is this medicine? PREDNISONE (PRED ni sone) is a corticosteroid. It is commonly used to treat inflammation of the skin, joints, lungs, and other organs. Common conditions treated include asthma, allergies, and arthritis. It is also used for other conditions, such as blood disorders and diseases of the adrenal glands. This medicine may be used for other purposes; ask your health care provider or pharmacist if you have questions. COMMON BRAND NAME(S): Deltasone, Predone, Sterapred, Sterapred DS What should I tell my health care provider before I take this medicine? They need to know if you have any of these conditions: -Cushing's syndrome -diabetes -glaucoma -heart disease -high blood pressure -infection (especially a virus infection such as chickenpox, cold sores, or herpes) -kidney disease -liver disease -mental illness -myasthenia gravis -osteoporosis -seizures -stomach or intestine problems -thyroid disease -an unusual or allergic reaction to lactose, prednisone, other medicines, foods, dyes, or preservatives -pregnant or trying to get pregnant -breast-feeding How should I use this medicine? Take this medicine by mouth with a glass of water. Follow the directions on the prescription label. Take this medicine with food. If you are taking this medicine once a day, take it in the morning. Do not take more medicine than you are told to take. Do not suddenly stop taking your medicine because you may develop a severe reaction. Your doctor will tell you how much medicine to take. If your doctor wants you to stop the medicine, the dose may be slowly lowered over time to avoid any side effects. Talk to your pediatrician regarding the use of this medicine in  children. Special care may be needed. Overdosage: If you think you have taken too much of this medicine contact a poison control center or emergency room at once. NOTE: This medicine is only for you. Do not share this medicine with others. What if I miss a dose? If you miss a dose, take it as soon as you can. If it is almost time for your next dose, talk to your doctor or health care professional. You may need to miss a dose or take an extra dose. Do not take double or extra doses without advice. What may interact with this medicine? Do not take this medicine with any of the following medications: -metyrapone -mifepristone This medicine may also interact with the following medications: -aminoglutethimide -amphotericin B -aspirin and aspirin-like medicines -barbiturates -certain medicines for diabetes, like glipizide or glyburide -cholestyramine -cholinesterase inhibitors -cyclosporine -digoxin -diuretics -ephedrine -male hormones, like estrogens and birth control pills -isoniazid -ketoconazole -NSAIDS, medicines for pain and inflammation, like ibuprofen or naproxen -phenytoin -rifampin -toxoids -vaccines -warfarin This list may not describe all possible interactions. Give your health care provider a list of all the medicines, herbs, non-prescription drugs, or dietary supplements you use. Also tell them if you smoke, drink alcohol, or use illegal drugs. Some items may interact with your medicine. What should I watch for while using this medicine? Visit your doctor or health care professional for regular checks on your progress. If you are taking this medicine over a prolonged period, carry an identification card with your name and address, the type and dose of your medicine, and your doctor's name and address. This medicine may increase your risk of getting an  infection. Tell your doctor or health care professional if you are around anyone with measles or chickenpox, or if you  develop sores or blisters that do not heal properly. If you are going to have surgery, tell your doctor or health care professional that you have taken this medicine within the last twelve months. Ask your doctor or health care professional about your diet. You may need to lower the amount of salt you eat. This medicine may increase blood sugar. Ask your healthcare provider if changes in diet or medicines are needed if you have diabetes. What side effects may I notice from receiving this medicine? Side effects that you should report to your doctor or health care professional as soon as possible: -allergic reactions like skin rash, itching or hives, swelling of the face, lips, or tongue -changes in emotions or moods -changes in vision -depressed mood -eye pain -fever or chills, cough, sore throat, pain or difficulty passing urine -signs and symptoms of high blood sugar such as being more thirsty or hungry or having to urinate more than normal. You may also feel very tired or have blurry vision. -swelling of ankles, feet Side effects that usually do not require medical attention (report to your doctor or health care professional if they continue or are bothersome): -confusion, excitement, restlessness -headache -nausea, vomiting -skin problems, acne, thin and shiny skin -trouble sleeping -weight gain This list may not describe all possible side effects. Call your doctor for medical advice about side effects. You may report side effects to FDA at 1-800-FDA-1088. Where should I keep my medicine? Keep out of the reach of children. Store at room temperature between 15 and 30 degrees C (59 and 86 degrees F). Protect from light. Keep container tightly closed. Throw away any unused medicine after the expiration date. NOTE: This sheet is a summary. It may not cover all possible information. If you have questions about this medicine, talk to your doctor, pharmacist, or health care provider.  2019  [employer]vier/Gold Standard (2018-03-06 10:54:22)

## 2018-07-31 LAB — CBC WITH DIFFERENTIAL/PLATELET
Absolute Monocytes: 717 cells/uL (ref 200–950)
BASOS ABS: 92 {cells}/uL (ref 0–200)
Basophils Relative: 1.3 %
EOS ABS: 92 {cells}/uL (ref 15–500)
Eosinophils Relative: 1.3 %
HCT: 41.9 % (ref 38.5–50.0)
Hemoglobin: 14.6 g/dL (ref 13.2–17.1)
Lymphs Abs: 1860 cells/uL (ref 850–3900)
MCH: 32.2 pg (ref 27.0–33.0)
MCHC: 34.8 g/dL (ref 32.0–36.0)
MCV: 92.5 fL (ref 80.0–100.0)
MONOS PCT: 10.1 %
MPV: 10.7 fL (ref 7.5–12.5)
NEUTROS PCT: 61.1 %
Neutro Abs: 4338 cells/uL (ref 1500–7800)
PLATELETS: 224 10*3/uL (ref 140–400)
RBC: 4.53 10*6/uL (ref 4.20–5.80)
RDW: 13.5 % (ref 11.0–15.0)
TOTAL LYMPHOCYTE: 26.2 %
WBC: 7.1 10*3/uL (ref 3.8–10.8)

## 2018-07-31 LAB — COMPLETE METABOLIC PANEL WITHOUT GFR
AG Ratio: 1.5 (calc) (ref 1.0–2.5)
ALT: 20 U/L (ref 9–46)
AST: 22 U/L (ref 10–35)
Albumin: 4.5 g/dL (ref 3.6–5.1)
Alkaline phosphatase (APISO): 88 U/L (ref 35–144)
BUN: 11 mg/dL (ref 7–25)
CO2: 24 mmol/L (ref 20–32)
Calcium: 9.3 mg/dL (ref 8.6–10.3)
Chloride: 102 mmol/L (ref 98–110)
Creat: 0.78 mg/dL (ref 0.70–1.18)
GFR, Est African American: 102 mL/min/{1.73_m2}
GFR, Est Non African American: 88 mL/min/{1.73_m2}
Globulin: 3 g/dL (ref 1.9–3.7)
Glucose, Bld: 96 mg/dL (ref 65–99)
Potassium: 4.2 mmol/L (ref 3.5–5.3)
Sodium: 137 mmol/L (ref 135–146)
Total Bilirubin: 0.6 mg/dL (ref 0.2–1.2)
Total Protein: 7.5 g/dL (ref 6.1–8.1)

## 2018-07-31 LAB — QUANTIFERON-TB GOLD PLUS
Mitogen-NIL: 6.43 IU/mL
NIL: 0.06 IU/mL
QuantiFERON-TB Gold Plus: NEGATIVE
TB1-NIL: 0.02 IU/mL
TB2-NIL: 0.03 IU/mL

## 2018-07-31 LAB — PROTEIN ELECTROPHORESIS, SERUM, WITH REFLEX
Albumin ELP: 4.2 g/dL (ref 3.8–4.8)
Alpha 1: 0.3 g/dL (ref 0.2–0.3)
Alpha 2: 0.8 g/dL (ref 0.5–0.9)
Beta 2: 0.5 g/dL (ref 0.2–0.5)
Beta Globulin: 0.5 g/dL (ref 0.4–0.6)
Gamma Globulin: 1.1 g/dL (ref 0.8–1.7)
TOTAL PROTEIN: 7.3 g/dL (ref 6.1–8.1)

## 2018-07-31 LAB — HEPATITIS C ANTIBODY
Hepatitis C Ab: NONREACTIVE
SIGNAL TO CUT-OFF: 0.02 (ref <1.00)

## 2018-07-31 LAB — IGG, IGA, IGM
IMMUNOGLOBULIN A: 412 mg/dL — AB (ref 70–320)
IgG (Immunoglobin G), Serum: 1173 mg/dL (ref 600–1540)
IgM, Serum: 112 mg/dL (ref 50–300)

## 2018-07-31 LAB — HEPATITIS B SURFACE ANTIGEN: Hepatitis B Surface Ag: NONREACTIVE

## 2018-07-31 LAB — CK: Total CK: 173 U/L (ref 44–196)

## 2018-07-31 LAB — HEPATITIS B CORE ANTIBODY, IGM: Hep B C IgM: NONREACTIVE

## 2018-07-31 LAB — 14-3-3 ETA PROTEIN: 14-3-3 eta Protein: <0.2 ng/mL (ref <0.2)

## 2018-07-31 LAB — TSH: TSH: 3.22 mIU/L (ref 0.40–4.50)

## 2018-07-31 LAB — CYCLIC CITRUL PEPTIDE ANTIBODY, IGG: Cyclic Citrullin Peptide Ab: <16 UNITS

## 2018-07-31 LAB — SEDIMENTATION RATE: Sed Rate: 14 mm/h (ref 0–20)

## 2018-07-31 LAB — URIC ACID: Uric Acid, Serum: 4.6 mg/dL (ref 4.0–8.0)

## 2018-07-31 LAB — GLUCOSE 6 PHOSPHATE DEHYDROGENASE: G-6PDH: 12.7 U/g Hgb (ref 7.0–20.5)

## 2018-08-01 NOTE — Progress Notes (Signed)
All labs are within normal limits.  We will discuss results at the follow-up visit.

## 2018-08-08 DIAGNOSIS — M19071 Primary osteoarthritis, right ankle and foot: Secondary | ICD-10-CM | POA: Insufficient documentation

## 2018-08-08 DIAGNOSIS — M19072 Primary osteoarthritis, left ankle and foot: Secondary | ICD-10-CM

## 2018-08-08 DIAGNOSIS — M1711 Unilateral primary osteoarthritis, right knee: Secondary | ICD-10-CM | POA: Insufficient documentation

## 2018-08-08 NOTE — Progress Notes (Signed)
Office Visit Note  Patient: Scott Chang             Date of Birth: 1941-02-23           MRN: [id number]             PCP: Doree Albee, MD Referring: Sinda Du, MD Visit Date: 08/21/2018 Occupation: '@GUAROCC'$ @  Subjective:  Pain in multiple joints  History of Present Illness: Scott Chang is a 78 y.o. male with history of seronegative rheumatoid arthritis, osteoarthritis, and DDD.  He was started on a prednisone taper at 20 mg tapering by 5 mg every 4 days after his initial visit on 07/27/18.  He noted significant improvement while on prednisone.  He finished prednisone about 2 weeks ago.  He states that about 4 days after completing the taper he developed increased pain and swelling in bilateral hands.  He is also been having increased discomfort in bilateral shoulder joints.  He has also noticed some pain in the right ankle causing a limp but denies any joint swelling at this time.  He states that he has been having severe pain in the right ankle at night.   Activities of Daily Living:  Patient reports morning stiffness for 30-40 minutes.   Patient Reports nocturnal pain.  Difficulty dressing/grooming: Reports Difficulty climbing stairs: Reports Difficulty getting out of chair: Reports Difficulty using hands for taps, buttons, cutlery, and/or writing: Reports  Review of Systems  Constitutional: Negative for fatigue.  HENT: Negative for mouth sores, mouth dryness and nose dryness.   Eyes: Negative for itching and dryness.  Respiratory: Negative for shortness of breath, wheezing and difficulty breathing.   Cardiovascular: Negative for chest pain, palpitations and swelling in legs/feet.  Gastrointestinal: Negative for abdominal pain, constipation, diarrhea, nausea and vomiting.  Endocrine: Positive for increased urination.  Genitourinary: Negative for painful urination and pelvic pain.  Musculoskeletal: Positive for arthralgias, joint pain, joint swelling and morning  stiffness.  Skin: Negative for rash.  Allergic/Immunologic: Negative for susceptible to infections.  Neurological: Positive for memory loss. Negative for dizziness, light-headedness, headaches and weakness.  Hematological: Negative for swollen glands.  Psychiatric/Behavioral: Positive for sleep disturbance. Negative for confusion. The patient is not nervous/anxious.     PMFS History:  Patient Active Problem List   Diagnosis Date Noted  . Primary osteoarthritis of both feet 08/08/2018  . Primary osteoarthritis of right knee 08/08/2018  . Other fatigue 07/27/2018  . History of anxiety 07/27/2018  . History of colonic polyps 07/27/2018  . History of hyperlipidemia 07/27/2018  . Vitamin D deficiency 07/27/2018  . Neuropathy 07/27/2018  . DDD (degenerative disc disease), cervical 07/27/2018  . DDD (degenerative disc disease), lumbar 07/27/2018  . Primary osteoarthritis of both hands 07/27/2018    Past Medical History:  Diagnosis Date  . Anxiety   . Arthritis   . Colon polyps   . Hypercholesteremia   . Neuropathy     Family History  Problem Relation Age of Onset  . Chronic Renal Failure Mother   . Heart attack Father   . Cancer Brother   . Osteoarthritis Daughter   . Colon cancer Neg Hx    Past Surgical History:  Procedure Laterality Date  . APPENDECTOMY    . CHOLECYSTECTOMY    . COLONOSCOPY N/A 05/09/2013   Procedure: COLONOSCOPY;  Surgeon: Rogene Houston, MD;  Location: [facility];  Service: Endoscopy;  Laterality: N/A;  1030  . COLONOSCOPY W/ BIOPSIES AND POLYPECTOMY    .  Left knee arthroscopy    . Left shoulder arthroscopy     X 2   Social History   Social History Narrative  . Not on file    There is no immunization history on file for this patient.   Objective: Vital Signs: BP (!) 150/84 (BP Location: Left Arm, Patient Position: Sitting, Cuff Size: Normal)   Pulse 78   Resp 15   Ht 5' 10.75" (1.797 m)   Wt 159 lb 9.6 oz (72.4 kg)   BMI 22.42 kg/m     Physical Exam Vitals signs and nursing note reviewed.  Constitutional:      Appearance: He is well-developed.  HENT:     Head: Normocephalic and atraumatic.  Eyes:     Conjunctiva/sclera: Conjunctivae normal.     Pupils: Pupils are equal, round, and reactive to light.  Neck:     Musculoskeletal: Normal range of motion and neck supple.  Cardiovascular:     Rate and Rhythm: Normal rate and regular rhythm.     Heart sounds: Normal heart sounds.  Pulmonary:     Effort: Pulmonary effort is normal.     Breath sounds: Normal breath sounds.  Abdominal:     General: Bowel sounds are normal.     Palpations: Abdomen is soft.  Lymphadenopathy:     Cervical: No cervical adenopathy.  Skin:    General: Skin is warm and dry.     Capillary Refill: Capillary refill takes less than 2 seconds.  Neurological:     Mental Status: He is alert and oriented to person, place, and time.  Psychiatric:        Behavior: Behavior normal.      Musculoskeletal Exam: C-spine, thoracic spine, and lumbar spine good ROM.  No midline spinal tenderness.  No SI joint tenderness.  Shoulder ROM slightly limited with discomfort. Elbow joints and wrist joints good ROM with no synovitis. 70% fist formation bilaterally.  Right 3rd and 5th MCP joint synovitis.  Left 3rd MCP synovitis. PIP and DIP synovial thickening consistent with osteoarthritis. Hip joints, knee joints, ankle joints, MTPs, PIPs, and DIPs good ROM with no synovitis.  No warmth or effusion of knee joints.  Right ankle joint tenderness but no swelling noted.  CDAI Exam: CDAI Score: 7.4  Patient Global Assessment: 7 (mm); Provider Global Assessment: 7 (mm) Swollen: 3 ; Tender: 4  Joint Exam      Right  Left  MCP 3  Swollen Tender  Swollen Tender  MCP 5  Swollen Tender     Ankle   Tender        Investigation: No additional findings.  Imaging: [place] Extrem Up Bilat Comp  Result Date: 07/27/2018 Ultrasound examination of bilateral hands was  performed per EULAR recommendations. Using 12 MHz transducer, grayscale and power Doppler bilateral second, third, and fifth MCP joints and bilateral wrist joints both dorsal and volar aspects were evaluated to look for synovitis or tenosynovitis. The findings were there was synovitis in bilateral second, third and fifth MCP joints and wrist joints on ultrasound examination. Right median nerve was 0.08 cm squares which was within normal limits and left median nerve was 0.08 cm squares which was within normal limits. Impression: Ultrasound examination was consistent with inflammatory arthritis.  Xr Foot 2 Views Left  Result Date: 07/27/2018 PIP, DIP and first MTP narrowing was noted.  No intertarsal, tibiotalar or subtalar joint space narrowing was noted.  Small posterior inferior calcaneal spurs were noted.  No erosive changes were  noted. Impression: These findings are consistent with osteoarthritis of the foot.  Xr Foot 2 Views Right  Result Date: 07/27/2018 PIP, DIP and first MTP narrowing was noted.  No intertarsal, tibiotalar or subtalar joint space narrowing was noted.  Small posterior inferior calcaneal spurs were noted.  No erosive changes were noted. Impression: These findings are consistent with osteoarthritis of the foot.  Xr Hand 2 View Left  Result Date: 07/27/2018 Juxta-articular osteopenia was noted.  PIP and DIP narrowing was noted.  Third MCP narrowing was noted.  CMC narrowing was noted.  No metacarpocarpal intercarpal joint space narrowing was noted.  Cystic versus erosive changes were noted in the carpal bones. 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  Xr Hand 2 View Right  Result Date: 07/27/2018 PIP and DIP narrowing was noted.  Third MCP joint narrowing was noted.  Juxta-articular osteopenia was noted.  No intercarpal, radiocarpal or metacarpal carpal joint space narrowing was noted.  No erosive changes were noted. Impression:  These findings are consistent with inflammatory arthritis and osteoarthritis overlap.  Xr Knee 3 View Left  Result Date: 07/27/2018 Severe medial compartment narrowing was noted.  Medial osteophytes were noted.  No chondrocalcinosis was noted.  Severe patellofemoral narrowing was noted. 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  Xr Knee 3 View Right  Result Date: 07/27/2018 Severe medial compartment narrowing was noted.  No chondrocalcinosis was noted.  Severe patellofemoral narrowing was noted. 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  Xr Shoulder Left  Result Date: 07/27/2018 No glenohumeral joint space narrowing was noted.  Postsurgical changes were noted in the left distal clavicle.  No chondrocalcinosis was noted.  Xr Shoulder Right  Result Date: 07/27/2018 No glenohumeral joint space narrowing was noted.  No acromioclavicular joint space narrowing was noted.  No chondrocalcinosis was noted.   Recent Labs: Lab Results  Component Value Date   WBC 7.1 07/27/2018   HGB 14.6 07/27/2018   PLT 224 07/27/2018   NA 137 07/27/2018   K 4.2 07/27/2018   CL 102 07/27/2018   CO2 24 07/27/2018   GLUCOSE 96 07/27/2018   BUN 11 07/27/2018   CREATININE 0.78 07/27/2018   BILITOT 0.6 07/27/2018   AST 22 07/27/2018   ALT 20 07/27/2018   PROT 7.5 07/27/2018   PROT 7.3 07/27/2018   CALCIUM 9.3 07/27/2018   GFRAA 102 07/27/2018   QFTBGOLDPLUS NEGATIVE 07/27/2018   July 27, 2018 SPEP normal, TB Gold negative, immunoglobulins normal, hepatitis B-, hepatitis C negative, G6PD normal, CK 173, TSH normal, ESR 14, anti-CCP negative, _0 eta negative, uric acid 4.6 Speciality Comments: No specialty comments available.  Procedures:  No procedures performed Allergies: Bee venom and Penicillins   Assessment / Plan:     Visit Diagnoses: Rheumatoid arthritis of multiple  sites with negative rheumatoid factor ([place]) - Patient had synovitis over bilateral second and third MCP joints and PIP joints.  X-rays were consistent with inflammatory arthritis.  Needle counts regarding rheumatoid arthritis was provided.  Indications side effects contraindications of different medications were discussed.  He wants to proceed with methotrexate.  He has history of nausea and would prefer to do injectable methotrexate.  We will start him on 0.4 mL subcu weekly.  If tolerated we will increase the dose.  He would also take folic acid along with that.  He will need labs every 2 weeks x 2 and then every  2 months.- Plan: Insulin Syringe-Needle U-100 30G X 5/16" 1 ML MISC  High risk medication use - Patient was started on prednisone taper starting at 20 mg p.o. daily at the last visit and taper by 5 mg every 4 days.  Primary osteoarthritis of both hands-he has underlying osteoarthritis in his hands with DIP and PIP thickening causes discomfort.  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  He has chronic discomfort in his knee joints due to underlying osteoarthritis.  Primary osteoarthritis of both feet-he had no synovitis in his MTPs.  DDD (degenerative disc disease), cervical-chronic discomfort.  DDD (degenerative disc disease), lumbar-chronic discomfort.  History of anxiety  History of colonic polyps  History of hyperlipidemia  Neuropathy  Vitamin D deficiency   Orders: No orders of the defined types were placed in this encounter.  Meds ordered this encounter  Medications  . methotrexate 50 MG/2ML injection    Sig: Inject 0.4 mLs (10 mg total) into the skin once a week for 14 days, THEN 0.6 mLs (15 mg total) once a week for 14 days.    Dispense:  2 mL    Refill:  0    Please use vial with preservatives for multidose use  . Insulin Syringe-Needle U-100 30G X 5/16" 1 ML MISC    Sig: Use 1 syringe to inject methotrexate  weekly    Dispense:  10 each    Refill:  1  . folic acid (FOLVITE) 1 MG tablet    Sig: Take 2 tablets (2 mg total) by mouth daily.    Dispense:  180 tablet    Refill:  3    Face-to-face time spent with patient was 30 minutes. Greater than 50% of time was spent in counseling and coordination of care.  Follow-Up Instructions: Return in about 3 months (around 11/21/2018) for Rheumatoid arthritis.   Bo Merino, MD  Note - This record has been created using Editor, commissioning.  Chart creation errors have been sought, but may not always  have been located. Such creation errors do not reflect on  the standard of medical care.

## 2018-08-21 ENCOUNTER — Encounter: Payer: Self-pay | Admitting: Rheumatology

## 2018-08-21 ENCOUNTER — Telehealth: Payer: Self-pay | Admitting: Rheumatology

## 2018-08-21 ENCOUNTER — Ambulatory Visit (INDEPENDENT_AMBULATORY_CARE_PROVIDER_SITE_OTHER): Payer: Medicare HMO | Admitting: Rheumatology

## 2018-08-21 VITALS — BP 150/84 | HR 78 | Resp 15 | Ht 70.75 in | Wt 159.6 lb

## 2018-08-21 DIAGNOSIS — M19042 Primary osteoarthritis, left hand: Secondary | ICD-10-CM

## 2018-08-21 DIAGNOSIS — Z79899 Other long term (current) drug therapy: Secondary | ICD-10-CM

## 2018-08-21 DIAGNOSIS — M51369 Other intervertebral disc degeneration, lumbar region without mention of lumbar back pain or lower extremity pain: Secondary | ICD-10-CM

## 2018-08-21 DIAGNOSIS — Z8639 Personal history of other endocrine, nutritional and metabolic disease: Secondary | ICD-10-CM

## 2018-08-21 DIAGNOSIS — Z8659 Personal history of other mental and behavioral disorders: Secondary | ICD-10-CM

## 2018-08-21 DIAGNOSIS — Z860100 Personal history of colon polyps, unspecified: Secondary | ICD-10-CM

## 2018-08-21 DIAGNOSIS — M19041 Primary osteoarthritis, right hand: Secondary | ICD-10-CM | POA: Diagnosis not present

## 2018-08-21 DIAGNOSIS — M0609 Rheumatoid arthritis without rheumatoid factor, multiple sites: Secondary | ICD-10-CM

## 2018-08-21 DIAGNOSIS — G629 Polyneuropathy, unspecified: Secondary | ICD-10-CM

## 2018-08-21 DIAGNOSIS — M19072 Primary osteoarthritis, left ankle and foot: Secondary | ICD-10-CM

## 2018-08-21 DIAGNOSIS — M5030 Other cervical disc degeneration, unspecified cervical region: Secondary | ICD-10-CM

## 2018-08-21 DIAGNOSIS — M19071 Primary osteoarthritis, right ankle and foot: Secondary | ICD-10-CM | POA: Diagnosis not present

## 2018-08-21 DIAGNOSIS — M1711 Unilateral primary osteoarthritis, right knee: Secondary | ICD-10-CM

## 2018-08-21 DIAGNOSIS — M5136 Other intervertebral disc degeneration, lumbar region: Secondary | ICD-10-CM | POA: Diagnosis not present

## 2018-08-21 DIAGNOSIS — E559 Vitamin D deficiency, unspecified: Secondary | ICD-10-CM

## 2018-08-21 MED ORDER — METHOTREXATE SODIUM CHEMO INJECTION 50 MG/2ML: INTRAMUSCULAR | 0 refills | Status: AC

## 2018-08-21 MED ORDER — FOLIC ACID 1 MG PO TABS: 2.0000 mg | ORAL_TABLET | Freq: Every day | ORAL | 3 refills | Status: DC

## 2018-08-21 MED ORDER — "INSULIN SYRINGE-NEEDLE U-100 30G X 5/16"" 1 ML MISC": 1 refills | Status: DC

## 2018-08-21 NOTE — Telephone Encounter (Signed)
April states they patient's wife came to pick up prescription and thought that the syringes were to be 10 mL syringes. She states she gave them a different syringe other than the Insulin syringe because it was marked for 0.4 mL and 0.6 mL. Advised April patient was not to have 10 mL syringes. Patient to have 1 mL syringes and the syringes given were okay.

## 2018-08-21 NOTE — Patient Instructions (Addendum)
Standing Labs We placed an order today for your standing lab work.    Please come back and get your standing labs in 2 weeks x3 and then every 3 months.   We have open lab Monday through Friday from 8:30-11:30 AM and 1:30-4:00 PM  at the office of Dr. Bo Merino.   You may experience shorter wait times on Monday and Friday afternoons. The office is located at [address] [place], [place], [place] [address] No appointment is necessary.   Labs are drawn by [facility].  You may receive a bill from [facility] for your lab work.  If you wish to have your labs drawn at another location, please call the office 24 hours in advance to send orders.  If you have any questions regarding directions or hours of operation,  please call [phone].   Just as a reminder please drink plenty of water prior to coming for your lab work. Thanks!  Vaccines You are taking a medication(s) that can suppress your immune system.  The following immunizations are recommended: . Flu annually . Pneumonia (Pneumovax 23 and Prevnar 13 spaced at least 1 year apart) . Shingrix  Please check with your PCP to make sure you are up to date.    Methotrexate injection What is this medicine? METHOTREXATE (METH oh TREX ate) is a chemotherapy drug used to treat cancer including breast cancer, leukemia, and lymphoma. This medicine can also be used to treat psoriasis and certain kinds of arthritis. This medicine may be used for other purposes; ask your health care provider or pharmacist if you have questions. What should I tell my health care provider before I take this medicine? They need to know if you have any of these conditions: -fluid in the stomach area or lungs -if you often drink alcohol -infection or immune system problems -kidney disease -liver disease -low blood counts, like low white cell, platelet, or red cell counts -lung disease -radiation therapy -stomach ulcers -ulcerative colitis -an unusual  or allergic reaction to methotrexate, other medicines, foods, dyes, or preservatives -pregnant or trying to get pregnant -breast-feeding How should I use this medicine? This medicine is for infusion into a vein or for injection into muscle or into the spinal fluid (whichever applies). It is usually given by a health care professional in a hospital or clinic setting. In rare cases, you might get this medicine at home. You will be taught how to give this medicine. Use exactly as directed. Take your medicine at regular intervals. Do not take your medicine more often than directed. If this medicine is used for arthritis or psoriasis, it should be taken weekly, NOT daily. It is important that you put your used needles and syringes in a special sharps container. Do not put them in a trash can. If you do not have a sharps container, call your pharmacist or healthcare provider to get one. Talk to your pediatrician regarding the use of this medicine in children. While this drug may be prescribed for children as young as 2 years for selected conditions, precautions do apply. Overdosage: If you think you have taken too much of this medicine contact a poison control center or emergency room at once. NOTE: This medicine is only for you. Do not share this medicine with others. What if I miss a dose? It is important not to miss your dose. Call your doctor or health care professional if you are unable to keep an appointment. If you give yourself the medicine and you  miss a dose, talk with your doctor or health care professional. Do not take double or extra doses. What may interact with this medicine? This medicine may interact with the following medications: -acitretin -aspirin or aspirin-like medicines including salicylates -azathioprine -certain antibiotics like chloramphenicol, penicillin, tetracycline -certain medicines for stomach problems like esomeprazole, omeprazole,  pantoprazole -cyclosporine -gold -hydroxychloroquine -live virus vaccines -mercaptopurine -NSAIDs, medicines for pain and inflammation, like ibuprofen or naproxen -other cytotoxic agents -penicillamine -phenylbutazone -phenytoin -probenacid -retinoids such as isotretinoin and tretinoin -steroid medicines like prednisone or cortisone -sulfonamides like sulfasalazine and trimethoprim/sulfamethoxazole -theophylline This list may not describe all possible interactions. Give your health care provider a list of all the medicines, herbs, non-prescription drugs, or dietary supplements you use. Also tell them if you smoke, drink alcohol, or use illegal drugs. Some items may interact with your medicine. What should I watch for while using this medicine? Avoid alcoholic drinks. In some cases, you may be given additional medicines to help with side effects. Follow all directions for their use. This medicine can make you more sensitive to the sun. Keep out of the sun. If you cannot avoid being in the sun, wear protective clothing and use sunscreen. Do not use sun lamps or tanning beds/booths. You may get drowsy or dizzy. Do not drive, use machinery, or do anything that needs mental alertness until you know how this medicine affects you. Do not stand or sit up quickly, especially if you are an older patient. This reduces the risk of dizzy or fainting spells. You may need blood work done while you are taking this medicine. Call your doctor or health care professional for advice if you get a fever, chills or sore throat, or other symptoms of a cold or flu. Do not treat yourself. This drug decreases your body's ability to fight infections. Try to avoid being around people who are sick. This medicine may increase your risk to bruise or bleed. Call your doctor or health care professional if you notice any unusual bleeding. Check with your doctor or health care professional if you get an attack of severe  diarrhea, nausea and vomiting, or if you sweat a lot. The loss of too much body fluid can make it dangerous for you to take this medicine. Talk to your doctor about your risk of cancer. You may be more at risk for certain types of cancers if you take this medicine. Both men and women must use effective birth control with this medicine. Do not become pregnant while taking this medicine or until at least 1 normal menstrual cycle has occurred after stopping it. Women should inform their doctor if they wish to become pregnant or think they might be pregnant. Men should not father a child while taking this medicine and for 3 months after stopping it. There is a potential for serious side effects to an unborn child. Talk to your health care professional or pharmacist for more information. Do not breast-feed an infant while taking this medicine. What side effects may I notice from receiving this medicine? Side effects that you should report to your doctor or health care professional as soon as possible: -allergic reactions like skin rash, itching or hives, swelling of the face, lips, or tongue -back pain -breathing problems or shortness of breath -confusion -diarrhea -dry, nonproductive cough -low blood counts - this medicine may decrease the number of white blood cells, red blood cells and platelets. You may be at increased risk of infections and bleeding -mouth sores -redness, blistering,  peeling or loosening of the skin, including inside the mouth -seizures -severe headaches -signs of infection - fever or chills, cough, sore throat, pain or difficulty passing urine -signs and symptoms of bleeding such as bloody or black, tarry stools; red or dark-brown urine; spitting up blood or brown material that looks like coffee grounds; red spots on the skin; unusual bruising or bleeding from the eye, gums, or nose -signs and symptoms of kidney injury like trouble passing urine or change in the amount of  urine -signs and symptoms of liver injury like dark yellow or brown urine; general ill feeling or flu-like symptoms; light-colored stools; loss of appetite; nausea; right upper belly pain; unusually weak or tired; yellowing of the eyes or skin -stiff neck -vomiting Side effects that usually do not require medical attention (report to your doctor or health care professional if they continue or are bothersome): -dizziness -hair loss -headache -stomach pain -upset stomach This list may not describe all possible side effects. Call your doctor for medical advice about side effects. You may report side effects to FDA at 1-800-FDA-1088. Where should I keep my medicine? If you are using this medicine at home, you will be instructed on how to store this medicine. Throw away any unused medicine after the expiration date on the label. NOTE: This sheet is a summary. It may not cover all possible information. If you have questions about this medicine, talk to your doctor, pharmacist, or health care provider.  2019 Elsevier/Gold Standard (2017-01-26 13:31:42)

## 2018-08-21 NOTE — Telephone Encounter (Signed)
April from [facility] left a voicemail requesting a return call regarding patient's prescriptions.  [phone]

## 2018-08-22 ENCOUNTER — Telehealth: Payer: Self-pay | Admitting: Pharmacy Technician

## 2018-08-22 NOTE — Telephone Encounter (Signed)
Received a Prior Authorization request from [employer] for [place]. Authorization has been submitted to patient's insurance via Cover My Meds. Will update once we receive a response.

## 2018-08-23 DIAGNOSIS — M0600 Rheumatoid arthritis without rheumatoid factor, unspecified site: Secondary | ICD-10-CM | POA: Diagnosis not present

## 2018-08-23 DIAGNOSIS — E559 Vitamin D deficiency, unspecified: Secondary | ICD-10-CM | POA: Diagnosis not present

## 2018-08-23 NOTE — Telephone Encounter (Signed)
Received a fax regarding Prior Authorization from [facility] for [place]. Authorization has been DENIED because plan requires patient to have tried all preferred drugs: azathioprine, hydroxychloroquine, AND leflunomide.  Will send document to scan center.  Phone# [phone]

## 2018-08-27 ENCOUNTER — Telehealth: Payer: Self-pay | Admitting: Rheumatology

## 2018-08-27 ENCOUNTER — Encounter: Payer: Self-pay | Admitting: Pharmacist

## 2018-08-27 NOTE — Progress Notes (Signed)
Appeal for [place] faxed.  Will update patient when we receive a response.

## 2018-08-27 NOTE — Telephone Encounter (Signed)
[name] called stating her dad will be getting his Pneumonia and Shingrix vaccines this week.  [name] is requesting a return call to let her know if it is okay for him to start his Methotrexate this Saturday 3/14.

## 2018-08-27 NOTE — Telephone Encounter (Signed)
Returned call and spoke with daughter [name].  He forgot about the vaccines until Friday and evening and was nervous about starting MTX this weekend.  Informed daughter that the vaccines do not have to be given prior to starting MTX.  Informed that there may be a slight decrease in immunity built but not enough to warrant delaying initiation of therapy.  She verbalized understanding.  Patient prefers to start MTX on the weekend in order to recover if he experiences any nausea or fatigue.  He plans to start this weekend.  All questions encouraged and answered.  Instructed patient to call with any further questions or concerns.  Mariella Saa, PharmD, [facility] Rheumatology Clinical Pharmacist  08/27/2018 9:22 AM

## 2018-08-31 NOTE — Telephone Encounter (Signed)
Received fax and phone call from [facility] requesting more clinical information for receive appeal.  The additional information that was added is listed below:  Formulary alternatives that have been tried and failed or would not be appropriate for this patient-Imuran and Plaquenil are not appropriate due to disease severity and Arava is not appropriate due to to patient having neuropathy.  Patient was started on methotrexate in office and had difficulty administering the injection due to to dexterity issues, osteoarthritis, and swelling.  This information along with previous medical notes will undergo further review.  We will update patient when we receive a response.  Will send document to scan center.  Phone #[phone] Fax [phone]  Mariella Saa, PharmD, [facility] Rheumatology Clinical Pharmacist  08/31/2018 11:18 AM

## 2018-09-03 ENCOUNTER — Telehealth: Payer: Self-pay | Admitting: Pharmacist

## 2018-09-03 NOTE — Telephone Encounter (Signed)
Ran test claim, copay for 1 month is $255. Patient can apply for [employer] Patient Assistance.

## 2018-09-03 NOTE — Telephone Encounter (Signed)
Received a fax from [facility] regarding a prior authorization for [place]. Authorization has been APPROVED from 08/31/2018 to 06/20/2019.   Will send document to scan center.  Authorization # [id number] Phone # [phone]

## 2018-09-03 NOTE — Telephone Encounter (Signed)
Received call from daughter [name] regarding starting methotrexate.  Her father was able to get the recommended vaccines prior to starting methotrexate.  He is concerned about Covid-19 virus as he states he already has a poor immune system and wants to delay starting MTX.  Informed [name] that we do not recommend holding medications at this time but follow CDC recommendations of hand hygiene, avoiding someone who is sick, and avoiding large crowds.  She verbalized understanding.  She states that her father is a very worrisome person and he has decided to delay starting methotrexate.    We will follow-up with Dr. Estanislado Pandy and will follow up with daughter if there are any other recommendations.  Also informed [name] that MTX pens were approved.  All questions encouraged and answered.  Instructed patient to call with any further questions or concerns.  Mariella Saa, PharmD, [facility] Rheumatology Clinical Pharmacist  09/03/2018 12:22 PM

## 2018-09-17 ENCOUNTER — Telehealth: Payer: Self-pay | Admitting: *Deleted

## 2018-09-17 NOTE — Telephone Encounter (Signed)
Patient's daughter [name] states the patient does not want to start MTX with the COVID-19 currently going on. Patient would like to delay starting that. Patient and his daughter would like to know if there is anything else he may take as he is having pain and swelling. He is having swelling and pain in his hands. Also having pain in his feet, knees, shoulders and elbows. Patient was previously on a Prednisone taper which helped. Please advise.

## 2018-09-18 MED ORDER — PREDNISONE 5 MG PO TABS: 10.0000 mg | ORAL_TABLET | Freq: Every day | ORAL | 0 refills | Status: DC

## 2018-09-18 NOTE — Telephone Encounter (Signed)
He may start on Prednisone 10 mg po qd. When his symptoms improve he may reduce to 5 mg po  and stay on that dose toll fu visit.

## 2018-09-18 NOTE — Telephone Encounter (Signed)
Patient advised He may start on Prednisone 10 mg po qd. When his symptoms improve he may reduce to 5 mg po  and stay on that dose toll fu visit. Patient verbalized understanding. Prescription sent to the pharmacy.

## 2018-10-18 ENCOUNTER — Telehealth: Payer: Self-pay | Admitting: *Deleted

## 2018-10-18 ENCOUNTER — Telehealth (INDEPENDENT_AMBULATORY_CARE_PROVIDER_SITE_OTHER): Payer: Medicare HMO | Admitting: Rheumatology

## 2018-10-18 ENCOUNTER — Encounter: Payer: Self-pay | Admitting: Rheumatology

## 2018-10-18 ENCOUNTER — Other Ambulatory Visit: Payer: Self-pay

## 2018-10-18 DIAGNOSIS — Z8659 Personal history of other mental and behavioral disorders: Secondary | ICD-10-CM

## 2018-10-18 DIAGNOSIS — E559 Vitamin D deficiency, unspecified: Secondary | ICD-10-CM

## 2018-10-18 DIAGNOSIS — M19071 Primary osteoarthritis, right ankle and foot: Secondary | ICD-10-CM

## 2018-10-18 DIAGNOSIS — Z8639 Personal history of other endocrine, nutritional and metabolic disease: Secondary | ICD-10-CM

## 2018-10-18 DIAGNOSIS — M0609 Rheumatoid arthritis without rheumatoid factor, multiple sites: Secondary | ICD-10-CM

## 2018-10-18 DIAGNOSIS — Z79899 Other long term (current) drug therapy: Secondary | ICD-10-CM | POA: Diagnosis not present

## 2018-10-18 DIAGNOSIS — G629 Polyneuropathy, unspecified: Secondary | ICD-10-CM

## 2018-10-18 DIAGNOSIS — M5030 Other cervical disc degeneration, unspecified cervical region: Secondary | ICD-10-CM

## 2018-10-18 DIAGNOSIS — M19041 Primary osteoarthritis, right hand: Secondary | ICD-10-CM | POA: Diagnosis not present

## 2018-10-18 DIAGNOSIS — Z86010 Personal history of colonic polyps: Secondary | ICD-10-CM

## 2018-10-18 DIAGNOSIS — M19072 Primary osteoarthritis, left ankle and foot: Secondary | ICD-10-CM

## 2018-10-18 DIAGNOSIS — M19042 Primary osteoarthritis, left hand: Secondary | ICD-10-CM

## 2018-10-18 DIAGNOSIS — M1711 Unilateral primary osteoarthritis, right knee: Secondary | ICD-10-CM | POA: Diagnosis not present

## 2018-10-18 DIAGNOSIS — M5136 Other intervertebral disc degeneration, lumbar region: Secondary | ICD-10-CM | POA: Diagnosis not present

## 2018-10-18 MED ORDER — HYDROXYCHLOROQUINE SULFATE 200 MG PO TABS: 200.0000 mg | ORAL_TABLET | Freq: Two times a day (BID) | ORAL | 0 refills | Status: DC

## 2018-10-18 MED ORDER — PREDNISONE 5 MG PO TABS: 10.0000 mg | ORAL_TABLET | Freq: Every day | ORAL | 0 refills | Status: DC

## 2018-10-18 NOTE — Telephone Encounter (Signed)
Schedule patient for a telemedicine appointment today.

## 2018-10-18 NOTE — Progress Notes (Signed)
Virtual Visit via Telephone Note  I connected with Scott Chang on 10/18/18 at 12:15 PM EDT by telephone and verified that I am speaking with the correct person using two identifiers. Location: Patient: Home Provider: In the office  I discussed the limitations, risks, security and privacy concerns of performing an evaluation and management service by telephone and the availability of in person appointments. I also discussed with the patient that there may be a patient responsible charge related to this service. The patient expressed understanding and agreed to proceed. This service was conducted via virtual visit. He was unable to use webex, so we reached him by telephone. The patient was located at home. I was located in my office.  Consent was obtained prior to the virtual visit and is aware of possible charges through their insurance for this visit.  The patient is an established patient.  Dr. Estanislado Pandy, MD conducted the virtual visit and [name], PA-C acted as scribe during the service.  Office staff helped with scheduling follow up visits after the service was conducted.   CC: Pain in multiple joints   History of Present Illness: Patient is a 78 year old male with a past medical history of seronegative rheumatoid arthritis, osteoarthritis, and DDD.  He is currently taking prednisone 10 mg po daily.  He is experiencing pain and swelling in multiple joints. He is unable to button his shirts due to the pain and swelling in both hands.  He is having difficulty with ADLs. He is apprehensive to start MTX due to the immunosuppression.     Review of Systems  Constitutional: Positive for malaise/fatigue. Negative for fever.  Eyes: Negative for photophobia, pain, discharge and redness.  Respiratory: Negative for cough, shortness of breath and wheezing.   Cardiovascular: Negative for chest pain and palpitations.  Gastrointestinal: Negative for blood in stool, constipation and diarrhea.   Genitourinary: Negative for dysuria.  Musculoskeletal: Positive for joint pain. Negative for back pain, myalgias and neck pain.       +Joint swelling +Joint stiffness   Skin: Negative for rash.  Neurological: Negative for dizziness and headaches.  Psychiatric/Behavioral: Positive for depression. The patient has insomnia. The patient is not nervous/anxious.       Observations/Objective: Physical Exam  Constitutional: He is oriented to person, place, and time.  Neurological: He is alert and oriented to person, place, and time.  Psychiatric: Mood, memory, affect and judgment normal.   Patient reports joint stiffness  all day.   Patient reports nocturnal pain.  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  He is having pain in multiple joints including both hands, both elbow joints, both shoulder joints,  both knee joints, and both feet.  He is experiencing pain and swelling in both hands.  He is having difficulty with ADLs and is unable to button his shirt.  He is apprehensive to start on Methotrexate due to the risk of infection during the COVID-19 pandemic.  He is currently taking prednisone 10 mg po daily.  We discussed the risks of staying on prednisone long-term.  He will continue on Prednisone 10 mg po daily.  We discussed starting him on Plaquenil.  Indications, contraindications, and potential side effects of PLQ were discussed.  All questions were addressed.   He will come by the office to sign the consent form and obtain the informational handout and PLQ eye exam form.  He was advised to notify us if he develops any side  effects.  He will notify us if he develops increased joint pain or joint swelling.  He will return for follow up in 2 months.    Patient was counseled on the purpose, proper use, and adverse effects of hydroxychloroquine including nausea/diarrhea, skin rash, headaches, and sun sensitivity.  Discussed importance of annual eye exams while on hydroxychloroquine to monitor to ocular toxicity and discussed importance of frequent laboratory monitoring.  Provided patient with eye exam form for baseline ophthalmologic exam.  Provided patient with educational materials on hydroxychloroquine and answered all questions.  Patient consented to hydroxychloroquine.  Will upload consent in the media tab.    Dose will be Plaquenil 200 mg twice daily.    High risk medication use - He is apprehensive to start on MTX due to the risk infection during the COVID-19 pandemic.  He is on prednisone 10 mg po daily.  He will be starting on PLQ as prescribed. He will return for lab work in 1 month then every 5 months.   Primary osteoarthritis of both hands-He has chronic pain and stiffness in both hands.  He is having bilateral hand swelling.  He is on prednisone 10 mg po daily.   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   Primary osteoarthritis of both feet-He experiencing pain in both feet.  No joint swelling.    DDD (degenerative disc disease), cervical-He has occasional discomfort.   DDD (degenerative disc disease), lumbar-Chronic pain and stiffness    Follow Up Instructions: He will return in 2 months.  He will start on PLQ 200 mg 1 tablet BID.   He will continue on prednisone 10 mg po daily. He is coming by the office to sign the consent form, receive the informational handout, and PLQ eye exam form.    I discussed the assessment and treatment plan with the patient. The patient was provided  an opportunity to ask questions and all were answered. The patient agreed with the plan and demonstrated an understanding of the instructions.   The patient was advised to call back or seek an in-person evaluation if the symptoms worsen or if the condition fails to improve as anticipated.  I provided 30 minutes of non-face-to-face time during this encounter. Bo Merino, MD   Scribed by-  Ofilia Neas, PA-C

## 2018-10-18 NOTE — Telephone Encounter (Signed)
Schedule a visit to discus options.

## 2018-10-18 NOTE — Telephone Encounter (Signed)
Spoke with patient, he was placed on Prednisone 10 mg on 09/17/18 as he has been having swelling and pain in his hands. Also having pain in his feet, knees, shoulders and elbows. Patient is postponing starting MTX due to COVID-19. Patient states the that the prednisone has helped some with the swelling and pain. Patient states he is still unable to button a shirt, Patient states he now does not feel the pain until he actually uses his hands to do something. Patient would like to know if will continue current dose of Prednisone or the plan for tapering. Please advise.

## 2018-10-18 NOTE — Telephone Encounter (Signed)
Patient in the office to discuss PLQ and sign consent. Patient states he needs refill on Prednisone. Per telemedicine office note on 10/18/18 : He is currently taking prednisone 10 mg po daily.  We discussed the risks of staying on prednisone long-term.  He will continue on Prednisone 10 mg po daily.  Okay to refill per Dr. Estanislado Pandy

## 2018-10-19 ENCOUNTER — Telehealth: Payer: Self-pay | Admitting: Rheumatology

## 2018-10-19 NOTE — Telephone Encounter (Signed)
-----   Message from Carole Binning, LPN sent at 9/37/1696  4:07 PM EDT ----- Please schedule patient for a follow up visit in 2 months. Patient had a telemedicine visit. Thanks!

## 2018-10-19 NOTE — Telephone Encounter (Signed)
I LMOM for patient to call, and schedule next rov for around the end of June/ 1st of July (2 month follow up) with Dr. Estanislado Pandy. Return office visit  11/26/2018 was canceled because patient have a virtual appt sooner than  originally scheduled.

## 2018-11-15 IMAGING — NM NM MYOCAR MULTI W/SPECT W/WALL MOTION & EF
2 series · 12 of 12 positions shown · non-contrast
Comparison: none

[Series 1: rest · 6.51mm/px · 6 of 64 frames shown]
[frame 6/64]
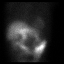
[frame 16/64]
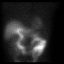
[frame 27/64]
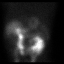
[frame 38/64]
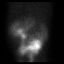
[frame 48/64]
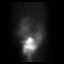
[frame 59/64]
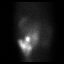

[Series 2: stress gated · 6.51mm/px · 6 of 64 frames shown]
[frame 6/64]
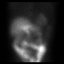
[frame 16/64]
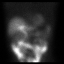
[frame 27/64]
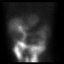
[frame 38/64]
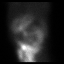
[frame 48/64]
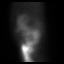
[frame 59/64]
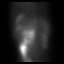

[12 of 12 positions shown; findings below may reference images not displayed]

Canned report from images found in remote index.

Refer to host system for actual result text.

## 2018-11-16 ENCOUNTER — Other Ambulatory Visit: Payer: Self-pay | Admitting: Rheumatology

## 2018-11-16 DIAGNOSIS — Z79899 Other long term (current) drug therapy: Secondary | ICD-10-CM | POA: Diagnosis not present

## 2018-11-16 DIAGNOSIS — Z23 Encounter for immunization: Secondary | ICD-10-CM | POA: Diagnosis not present

## 2018-11-16 DIAGNOSIS — S61431A Puncture wound without foreign body of right hand, initial encounter: Secondary | ICD-10-CM | POA: Diagnosis not present

## 2018-11-16 NOTE — Telephone Encounter (Signed)
Last Visit: 10/18/18 Next Visit: 12/24/18  Okay to refill per Dr. Estanislado Pandy

## 2018-11-26 ENCOUNTER — Ambulatory Visit: Payer: Self-pay | Admitting: Rheumatology

## 2018-12-03 ENCOUNTER — Ambulatory Visit (INDEPENDENT_AMBULATORY_CARE_PROVIDER_SITE_OTHER): Payer: Medicare HMO | Admitting: Internal Medicine

## 2018-12-10 NOTE — Progress Notes (Signed)
Office Visit Note  Patient: Scott Chang             Date of Birth: [birth year]           MRN: [id number]             PCP: Doree Albee, MD Referring: Doree Albee, MD Visit Date: 12/24/2018 Occupation: @GUAROCC @  Subjective:  Pain and swelling in multiple joints..   History of Present Illness: Scott Chang is a 78 y.o. male with history of seronegative rheumatoid arthritis.  He states he has not noticed much improvement on Plaquenil.  He has been on Plaquenil since May 2020.  He continues to have pain and swelling in his hands.  He also has lot of discomfort in his knee joints and his feet.  He has discomfort in his bilateral shoulders.  Activities of Daily Living:  Patient reports morning stiffness for 24 hours.   Patient Reports nocturnal pain.  Difficulty dressing/grooming: Reports Difficulty climbing stairs: Reports Difficulty getting out of chair: Reports Difficulty using hands for taps, buttons, cutlery, and/or writing: Reports  Review of Systems  Constitutional: Positive for fatigue. Negative for night sweats.  HENT: Negative for mouth sores, mouth dryness and nose dryness.   Eyes: Negative for redness, itching and dryness.  Respiratory: Negative for shortness of breath, wheezing and difficulty breathing.   Cardiovascular: Negative for chest pain, palpitations, hypertension, irregular heartbeat and swelling in legs/feet.  Gastrointestinal: Negative for abdominal pain, constipation and diarrhea.  Endocrine: Negative for increased urination.  Genitourinary: Negative for painful urination and pelvic pain.  Musculoskeletal: Positive for arthralgias, joint pain, joint swelling and morning stiffness. Negative for myalgias, muscle weakness, muscle tenderness and myalgias.  Skin: Negative for color change, rash, hair loss, nodules/bumps, redness, skin tightness, ulcers and sensitivity to sunlight.  Allergic/Immunologic: Negative for susceptible to infections.    Neurological: Positive for memory loss. Negative for dizziness, fainting, light-headedness, headaches, night sweats and weakness.  Hematological: Negative for bruising/bleeding tendency and swollen glands.  Psychiatric/Behavioral: Positive for sleep disturbance. Negative for depressed mood and confusion. The patient is not nervous/anxious.     PMFS History:  Patient Active Problem List   Diagnosis Date Noted   Primary osteoarthritis of both feet 08/08/2018   Primary osteoarthritis of right knee 08/08/2018   Other fatigue 07/27/2018   History of anxiety 07/27/2018   History of colonic polyps 07/27/2018   History of hyperlipidemia 07/27/2018   Vitamin D deficiency 07/27/2018   Neuropathy 07/27/2018   DDD (degenerative disc disease), cervical 07/27/2018   DDD (degenerative disc disease), lumbar 07/27/2018   Primary osteoarthritis of both hands 07/27/2018    Past Medical History:  Diagnosis Date   Anxiety    Arthritis    Colon polyps    Hypercholesteremia    Neuropathy     Family History  Problem Relation Age of Onset   Chronic Renal Failure Mother    Heart attack Father    Cancer Brother    Osteoarthritis Daughter    Colon cancer Neg Hx    Past Surgical History:  Procedure Laterality Date   APPENDECTOMY     CHOLECYSTECTOMY     COLONOSCOPY N/A 05/09/2013   Procedure: COLONOSCOPY;  Surgeon: Rogene Houston, MD;  Location: [facility];  Service: Endoscopy;  Laterality: N/A;  1030   COLONOSCOPY W/ BIOPSIES AND POLYPECTOMY     Left knee arthroscopy     Left shoulder arthroscopy     X 2   Social  History   Social History Narrative   Not on file   Immunization History  Administered Date(s) Administered   Pneumococcal Polysaccharide-23 08/27/2018   Td 11/16/2018   Zoster Recombinat (Shingrix) 08/27/2018     Objective: Vital Signs: BP 125/77 (BP Location: Left Arm, Patient Position: Sitting, Cuff Size: Normal)    Pulse 76    Resp 14     Ht 5' 10.75" (1.797 m)    Wt 159 lb 9.6 oz (72.4 kg)    BMI 22.42 kg/m    Physical Exam Vitals signs and nursing note reviewed.  Constitutional:      Appearance: He is well-developed.  HENT:     Head: Normocephalic and atraumatic.  Eyes:     Conjunctiva/sclera: Conjunctivae normal.     Pupils: Pupils are equal, round, and reactive to light.  Neck:     Musculoskeletal: Normal range of motion and neck supple.  Cardiovascular:     Rate and Rhythm: Normal rate and regular rhythm.     Heart sounds: Normal heart sounds.  Pulmonary:     Effort: Pulmonary effort is normal.     Breath sounds: Normal breath sounds.  Abdominal:     General: Bowel sounds are normal.     Palpations: Abdomen is soft.  Skin:    General: Skin is warm and dry.     Capillary Refill: Capillary refill takes less than 2 seconds.  Neurological:     Mental Status: He is alert and oriented to person, place, and time.  Psychiatric:        Behavior: Behavior normal.      Musculoskeletal Exam: C-spine thoracic and lumbar spine were in good range of motion.  He had painful range of motion of bilateral shoulder joints.  Elbow joints with and wrist joints with good range of motion.  He had mild inflammation over MCP joints as noted below.  He has tenderness over PIP joints bilaterally without much synovitis.  He had discomfort in his knee joints but no warmth swelling or effusion was noted.  He has osteoarthritic changes in his feet with bilateral first MTP PIP and DIP changes.  No synovitis was noted.  He had tenderness across MTPs and PIPs.  CDAI Exam: CDAI Score: 19.1  Patient Global: 7 mm; Provider Global: 4 mm Swollen: 3 ; Tender: 16  Joint Exam      Right  Left  Glenohumeral   Tender   Tender  MCP 3  Swollen Tender  Swollen Tender  MCP 5  Swollen Tender     PIP 2   Tender   Tender  PIP 3   Tender   Tender  PIP 4   Tender   Tender  PIP 5   Tender   Tender  Knee   Tender   Tender  Ankle      Tender      Investigation: No additional findings.  Imaging: No results found.  Recent Labs: Lab Results  Component Value Date   WBC 7.1 07/27/2018   HGB 14.6 07/27/2018   PLT 224 07/27/2018   NA 137 07/27/2018   K 4.2 07/27/2018   CL 102 07/27/2018   CO2 24 07/27/2018   GLUCOSE 96 07/27/2018   BUN 11 07/27/2018   CREATININE 0.78 07/27/2018   BILITOT 0.6 07/27/2018   AST 22 07/27/2018   ALT 20 07/27/2018   PROT 7.5 07/27/2018   PROT 7.3 07/27/2018   CALCIUM 9.3 07/27/2018   GFRAA 102 07/27/2018   QFTBGOLDPLUS  NEGATIVE 07/27/2018    Speciality Comments: PLQ Eye Exam: 11/16/2018 WNL Follow up in 1 year   Procedures:  No procedures performed Allergies: Bee venom and Penicillins   Assessment / Plan:     Visit Diagnoses: Rheumatoid arthritis of multiple sites with 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  He had mild inflammation on examination today.  I am uncertain how much of his symptoms are coming from the underlying osteoarthritis.  We had detailed discussion regarding possible use of methotrexate.  He was approved for Rasuvo.  Side effects were again reviewed.  He is willing to proceed with the medication.  We will try Rasuvo 15 mg subcu weekly.  We will check labs in 2 weeks, 1 month and then every 2 months.  I will recheck him in 2 months.  If he does not get great response to methotrexate that may indicate that most of the discomfort is coming from osteoarthritis.  Patient recalls being on prednisone in the past.  He states he did much better on prednisone.  High risk medication use -  Plaquenil 200 mg 1 tablet twice daily started in May 2020.Marland Kitchen  Last Plaquenil eye exam normal on 11/16/2018.  Most recent CBC/CMP within normal limits on 07/27/2018.  Due for CBC/CMP today and repeat in September.  Will monitor every 5 months.  Standing orders are in place.  He is received  Pneumovax 23 vaccine and one Shingrix vaccine.  Recommend annual flu, Prevnar 13, and second Shingrix vaccine as indicated for immunosuppressant therapy.  - Plan: CBC with Differential/Platelet, COMPLETE METABOLIC PANEL WITH GFR,  Primary osteoarthritis of both hands -he has severe osteoarthritis involving his both hands.  Joint protection and muscle strengthening was discussed.  He works in his yard and is very active.  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  He complains of bilateral knee joint discomfort.  No warmth swelling or effusion was noted.  Primary osteoarthritis of both feet -he has severe osteoarthritis in his feet and complains of feet discomfort.  I do not see any synovitis today.  DDD (degenerative disc disease), cervical - Plan: Chronic pain  DDD (degenerative disc disease), lumbar - Plan: Chronic pain  Other medical problems are listed as follows:  History of anxiety  History of colonic polyps  History of hyperlipidemia  Neuropathy  Vitamin D deficiency  Orders: Orders Placed This Encounter  Procedures   CBC with Differential/Platelet   COMPLETE METABOLIC PANEL WITH GFR   No orders of the defined types were placed in this encounter.   Face-to-face time spent with patient was 30 minutes. Greater than 50% of time was spent in counseling and coordination of care.  Follow-Up Instructions: Return in about 2 months (around 02/24/2019) for Rheumatoid arthritis, Osteoarthritis.   Bo Merino, MD  Note - This record has been created using Editor, commissioning.  Chart creation errors have been sought, but may not always  have been located. Such creation errors do not reflect on  the standard of medical care.

## 2018-12-18 ENCOUNTER — Other Ambulatory Visit: Payer: Self-pay | Admitting: Physician Assistant

## 2018-12-18 DIAGNOSIS — M0609 Rheumatoid arthritis without rheumatoid factor, multiple sites: Secondary | ICD-10-CM

## 2018-12-24 ENCOUNTER — Other Ambulatory Visit: Payer: Self-pay

## 2018-12-24 ENCOUNTER — Encounter: Payer: Self-pay | Admitting: Rheumatology

## 2018-12-24 ENCOUNTER — Ambulatory Visit (INDEPENDENT_AMBULATORY_CARE_PROVIDER_SITE_OTHER): Payer: Medicare HMO | Admitting: Rheumatology

## 2018-12-24 VITALS — BP 125/77 | HR 76 | Resp 14 | Ht 70.75 in | Wt 159.6 lb

## 2018-12-24 DIAGNOSIS — M1711 Unilateral primary osteoarthritis, right knee: Secondary | ICD-10-CM | POA: Diagnosis not present

## 2018-12-24 DIAGNOSIS — Z79899 Other long term (current) drug therapy: Secondary | ICD-10-CM

## 2018-12-24 DIAGNOSIS — M19071 Primary osteoarthritis, right ankle and foot: Secondary | ICD-10-CM

## 2018-12-24 DIAGNOSIS — M0609 Rheumatoid arthritis without rheumatoid factor, multiple sites: Secondary | ICD-10-CM

## 2018-12-24 DIAGNOSIS — M19072 Primary osteoarthritis, left ankle and foot: Secondary | ICD-10-CM | POA: Diagnosis not present

## 2018-12-24 DIAGNOSIS — M5030 Other cervical disc degeneration, unspecified cervical region: Secondary | ICD-10-CM

## 2018-12-24 DIAGNOSIS — M5136 Other intervertebral disc degeneration, lumbar region: Secondary | ICD-10-CM | POA: Diagnosis not present

## 2018-12-24 DIAGNOSIS — M19042 Primary osteoarthritis, left hand: Secondary | ICD-10-CM | POA: Diagnosis not present

## 2018-12-24 DIAGNOSIS — M19041 Primary osteoarthritis, right hand: Secondary | ICD-10-CM | POA: Diagnosis not present

## 2018-12-24 MED ORDER — METHOTREXATE SODIUM CHEMO INJECTION 50 MG/2ML: 15.0000 mg | INTRAMUSCULAR | 0 refills | Status: DC

## 2018-12-24 MED ORDER — "TUBERCULIN SYRINGE 27G X 1/2"" 1 ML MISC": 12.0000 | 3 refills | Status: DC

## 2018-12-24 MED ORDER — RASUVO 15 MG/0.3ML ~~LOC~~ SOAJ: 15.0000 mg | SUBCUTANEOUS | 0 refills | Status: DC

## 2018-12-24 MED ORDER — FOLIC ACID 1 MG PO TABS: 2.0000 mg | ORAL_TABLET | Freq: Every day | ORAL | 3 refills | Status: DC

## 2018-12-24 NOTE — Patient Instructions (Signed)
Standing Labs We placed an order today for your standing lab work.    Please come back and get your standing labs in 2 weeks, 4 weeks, then 8 weeks, then every 3 months.  We have open lab daily Monday through Thursday from 8:30-12:30 PM and 1:30-4:30 PM and Friday from 8:30-12:30 PM and 1:30 -4:00 PM at the office of Dr. Bo Merino.   You may experience shorter wait times on Monday and Friday afternoons. The office is located at [address], [place], [place], [place] [address] No appointment is necessary.   Labs are drawn by [facility].  You may receive a bill from [facility] for your lab work.  If you wish to have your labs drawn at another location, please call the office 24 hours in advance to send orders.  If you have any questions regarding directions or hours of operation,  please call [phone].   Just as a reminder please drink plenty of water prior to coming for your lab work. Thanks!  Methotrexate tablets What is this medicine? METHOTREXATE (METH oh TREX ate) is a chemotherapy drug used to treat cancer including breast cancer, leukemia, and lymphoma. This medicine can also be used to treat psoriasis and certain kinds of arthritis. This medicine may be used for other purposes; ask your health care provider or pharmacist if you have questions. COMMON BRAND NAME(S): Rheumatrex, Trexall What should I tell my health care provider before I take this medicine? They need to know if you have any of these conditions:  fluid in the stomach area or lungs  if you often drink alcohol  infection or immune system problems  kidney disease or on hemodialysis  liver disease  low blood counts, like low white cell, platelet, or red cell counts  lung disease  radiation therapy  stomach ulcers  ulcerative colitis  an unusual or allergic reaction to methotrexate, other medicines, foods, dyes, or preservatives  pregnant or trying to get pregnant  breast-feeding How should  I use this medicine? Take this medicine by mouth with a glass of water. Follow the directions on the prescription label. Take your medicine at regular intervals. Do not take it more often than directed. Do not stop taking except on your doctor's advice. Make sure you know why you are taking this medicine and how often you should take it. If this medicine is used for a condition that is not cancer, like arthritis or psoriasis, it should be taken weekly, NOT daily. Taking this medicine more often than directed can cause serious side effects, even death. Talk to your healthcare provider about safe handling and disposal of this medicine. You may need to take special precautions. Talk to your pediatrician regarding the use of this medicine in children. While this drug may be prescribed for selected conditions, precautions do apply. Overdosage: If you think you have taken too much of this medicine contact a poison control center or emergency room at once. NOTE: This medicine is only for you. Do not share this medicine with others. What if I miss a dose? If you miss a dose, talk with your doctor or health care professional. Do not take double or extra doses. What may interact with this medicine? This medicine may interact with the following medication:  acitretin  aspirin and aspirin-like medicines including salicylates  azathioprine  certain antibiotics like penicillins, tetracycline, and chloramphenicol  cyclosporine  gold  hydroxychloroquine  live virus vaccines  NSAIDs, medicines for pain and inflammation, like ibuprofen or naproxen  other cytotoxic agents  penicillamine  phenylbutazone  phenytoin  probenecid  retinoids such as isotretinoin and tretinoin  steroid medicines like prednisone or cortisone  sulfonamides like sulfasalazine and trimethoprim/sulfamethoxazole  theophylline This list may not describe all possible interactions. Give your health care provider a list  of all the medicines, herbs, non-prescription drugs, or dietary supplements you use. Also tell them if you smoke, drink alcohol, or use illegal drugs. Some items may interact with your medicine. What should I watch for while using this medicine? Avoid alcoholic drinks. This medicine can make you more sensitive to the sun. Keep out of the sun. If you cannot avoid being in the sun, wear protective clothing and use sunscreen. Do not use sun lamps or tanning beds/booths. You may need blood work done while you are taking this medicine. Call your doctor or health care professional for advice if you get a fever, chills or sore throat, or other symptoms of a cold or flu. Do not treat yourself. This drug decreases your body's ability to fight infections. Try to avoid being around people who are sick. This medicine may increase your risk to bruise or bleed. Call your doctor or health care professional if you notice any unusual bleeding. Check with your doctor or health care professional if you get an attack of severe diarrhea, nausea and vomiting, or if you sweat a lot. The loss of too much body fluid can make it dangerous for you to take this medicine. Talk to your doctor about your risk of cancer. You may be more at risk for certain types of cancers if you take this medicine. Both men and women must use effective birth control with this medicine. Do not become pregnant while taking this medicine or until at least 1 normal menstrual cycle has occurred after stopping it. Women should inform their doctor if they wish to become pregnant or think they might be pregnant. Men should not father a child while taking this medicine and for 3 months after stopping it. There is a potential for serious side effects to an unborn child. Talk to your health care professional or pharmacist for more information. Do not breast-feed an infant while taking this medicine. What side effects may I notice from receiving this  medicine? Side effects that you should report to your doctor or health care professional as soon as possible:  allergic reactions like skin rash, itching or hives, swelling of the face, lips, or tongue  breathing problems or shortness of breath  diarrhea  dry, nonproductive cough  low blood counts - this medicine may decrease the number of white blood cells, red blood cells and platelets. You may be at increased risk for infections and bleeding.  mouth sores  redness, blistering, peeling or loosening of the skin, including inside the mouth  signs of infection - fever or chills, cough, sore throat, pain or trouble passing urine  signs and symptoms of bleeding such as bloody or black, tarry stools; red or dark-brown urine; spitting up blood or brown material that looks like coffee grounds; red spots on the skin; unusual bruising or bleeding from the eye, gums, or nose  signs and symptoms of kidney injury like trouble passing urine or change in the amount of urine  signs and symptoms of liver injury like dark yellow or brown urine; general ill feeling or flu-like symptoms; light-colored stools; loss of appetite; nausea; right upper belly pain; unusually weak or tired; yellowing of the eyes or skin Side effects that  usually do not require medical attention (report to your doctor or health care professional if they continue or are bothersome):  dizziness  hair loss  tiredness  upset stomach  vomiting This list may not describe all possible side effects. Call your doctor for medical advice about side effects. You may report side effects to FDA at 1-800-FDA-1088. Where should I keep my medicine? Keep out of the reach of children. Store at room temperature between 20 and 25 degrees C (68 and 77 degrees F). Protect from light. Throw away any unused medicine after the expiration date. NOTE: This sheet is a summary. It may not cover all possible information. If you have questions about  this medicine, talk to your doctor, pharmacist, or health care provider.  2020 Elsevier/Gold Standard (2017-01-26 13:38:43)

## 2018-12-24 NOTE — Addendum Note (Signed)
Addended by: Mariella Saa C on: 12/24/2018 02:12 PM   Modules accepted: Orders

## 2018-12-24 NOTE — Progress Notes (Signed)
Pharmacy Note  Subjective: Patient presents today to the [facility] to see Dr. Estanislado Pandy.  Patient seen by the pharmacist for counseling on methotrexate for rheumatoid arthritis. Previously on methotrexate but patient discontinued due to concerns of immunosuppression and COVID-19.  He was started on Plaquenil in May with inadequate response.  Objective: CBC    Component Value Date/Time   WBC 7.1 07/27/2018 0944   RBC 4.53 07/27/2018 0944   HGB 14.6 07/27/2018 0944   HCT 41.9 07/27/2018 0944   PLT 224 07/27/2018 0944   MCV 92.5 07/27/2018 0944   MCH 32.2 07/27/2018 0944   MCHC 34.8 07/27/2018 0944   RDW 13.5 07/27/2018 0944   LYMPHSABS 1,860 07/27/2018 0944   EOSABS 92 07/27/2018 0944   BASOSABS 92 07/27/2018 0944    CMP     Component Value Date/Time   NA 137 07/27/2018 0944   K 4.2 07/27/2018 0944   CL 102 07/27/2018 0944   CO2 24 07/27/2018 0944   GLUCOSE 96 07/27/2018 0944   BUN 11 07/27/2018 0944   CREATININE 0.78 07/27/2018 0944   CALCIUM 9.3 07/27/2018 0944   PROT 7.5 07/27/2018 0944   PROT 7.3 07/27/2018 0944   AST 22 07/27/2018 0944   ALT 20 07/27/2018 0944   BILITOT 0.6 07/27/2018 0944   GFRNONAA 88 07/27/2018 0944   GFRAA 102 07/27/2018 0944    Baseline Immunosuppressant Therapy Labs TB GOLD Quantiferon TB Gold Latest Ref Rng & Units 07/27/2018  Quantiferon TB Gold Plus NEGATIVE NEGATIVE   Hepatitis Panel Hepatitis Latest Ref Rng & Units 07/27/2018  Hep B Surface Ag NON-REACTI NON-REACTIVE  Hep B IgM NON-REACTI NON-REACTIVE  Hep C Ab NON-REACTI NON-REACTIVE  Hep C Ab NON-REACTI NON-REACTIVE   HIV: negative per patient No results found for: HIV  Immunoglobulins Immunoglobulin Electrophoresis Latest Ref Rng & Units 07/27/2018  IgA  70 - 320 mg/dL 412(H)  IgG 600 - 1,540 mg/dL 1,173  IgM 50 - 300 mg/dL 112   SPEP Serum Protein Electrophoresis Latest Ref Rng & Units 07/27/2018  Total Protein 6.1 - 8.1 g/dL 7.3  Albumin 3.8 - 4.8 g/dL 4.2   Alpha-1 0.2 - 0.3 g/dL 0.3  Alpha-2 0.5 - 0.9 g/dL 0.8  Beta Globulin 0.4 - 0.6 g/dL 0.5  Beta 2 0.2 - 0.5 g/dL 0.5  Gamma Globulin 0.8 - 1.7 g/dL 1.1   G6PD Lab Results  Component Value Date   G6PDH 12.7 07/27/2018   TPMT No results found for: TPMT   Chest-xray:  No active cardiopulmonary disease 10/04/2013  Alcohol use: on ocassion  Assessment/Plan:   Patient was counseled on the purpose, proper use, and adverse effects of methotrexate including nausea, infection, and signs and symptoms of pneumonitis. Discussed that there is the possibility of an increased risk of malignancy, specifically lymphomas, but it is not well understood if this increased risk is due to the medication or the disease state.  Instructed patient that medication should be held for infection and prior to surgery.  Advised patient to avoid live vaccines. Recommend annual influenza, Pneumovax 23, Prevnar 13, and Shingrix as indicated.   Reviewed instructions with patient to take methotrexate weekly along with folic acid daily.  Discussed the importance of frequent monitoring of kidney and liver function and blood counts, and provided patient with standing lab instructions.  Counseled patient to avoid NSAIDs and alcohol while on methotrexate.  Provided patient with educational materials on methotrexate and answered all questions.   Patient voiced understanding. Patient previously consented  to methotrexate use and approved for Rasuvo pen.  He has used injectable medications in the past.  Demonstrated proper injection technique with Rasuvo demo pen. Patient able to demonstrate proper injection technique using the teach back method. He prefers to start Rasuvo on the weekend to allow time to recover from side effects.   Dose of Rasuvo will be 15 mg along with folic acid 1 mg 2 tablets daily.   All questions encouraged and answered.  Instructed patient to call with any further questions or concerns.  Mariella Saa, PharmD,  [place], CPP Rheumatology Clinical Pharmacist  12/24/2018 12:07 PM

## 2018-12-24 NOTE — Progress Notes (Signed)
Patient called and cost of Rasuvo was $200 per month.  Requested prescription for vial and syringe to be sent to local pharmacy.

## 2018-12-25 LAB — CBC WITH DIFFERENTIAL/PLATELET
Absolute Monocytes: 626 cells/uL (ref 200–950)
Basophils Absolute: 82 cells/uL (ref 0–200)
Basophils Relative: 1.2 %
Eosinophils Absolute: 150 cells/uL (ref 15–500)
Eosinophils Relative: 2.2 %
HCT: 41.8 % (ref 38.5–50.0)
Hemoglobin: 14.1 g/dL (ref 13.2–17.1)
Lymphs Abs: 1625 cells/uL (ref 850–3900)
MCH: 32.1 pg (ref 27.0–33.0)
MCHC: 33.7 g/dL (ref 32.0–36.0)
MCV: 95.2 fL (ref 80.0–100.0)
MPV: 10.4 fL (ref 7.5–12.5)
Monocytes Relative: 9.2 %
Neutro Abs: 4318 cells/uL (ref 1500–7800)
Neutrophils Relative %: 63.5 %
Platelets: 184 10*3/uL (ref 140–400)
RBC: 4.39 10*6/uL (ref 4.20–5.80)
RDW: 12.6 % (ref 11.0–15.0)
Total Lymphocyte: 23.9 %
WBC: 6.8 10*3/uL (ref 3.8–10.8)

## 2018-12-25 LAB — COMPLETE METABOLIC PANEL WITH GFR
AG Ratio: 1.5 (calc) (ref 1.0–2.5)
ALT: 17 U/L (ref 9–46)
AST: 20 U/L (ref 10–35)
Albumin: 4.3 g/dL (ref 3.6–5.1)
Alkaline phosphatase (APISO): 57 U/L (ref 35–144)
BUN: 10 mg/dL (ref 7–25)
CO2: 29 mmol/L (ref 20–32)
Calcium: 9.4 mg/dL (ref 8.6–10.3)
Chloride: 104 mmol/L (ref 98–110)
Creat: 0.92 mg/dL (ref 0.70–1.18)
GFR, Est African American: 93 mL/min/{1.73_m2} (ref >=60)
GFR, Est Non African American: 81 mL/min/{1.73_m2} (ref >=60)
Globulin: 2.9 g/dL (calc) (ref 1.9–3.7)
Glucose, Bld: 94 mg/dL (ref 65–99)
Potassium: 4.3 mmol/L (ref 3.5–5.3)
Sodium: 139 mmol/L (ref 135–146)
Total Bilirubin: 0.6 mg/dL (ref 0.2–1.2)
Total Protein: 7.2 g/dL (ref 6.1–8.1)

## 2018-12-25 NOTE — Progress Notes (Signed)
WNLs

## 2018-12-28 ENCOUNTER — Telehealth: Payer: Self-pay | Admitting: Rheumatology

## 2018-12-28 NOTE — Telephone Encounter (Signed)
Advised patient is to continue PLQ with the Rasuvo. Scott Chang verbalized understanding.

## 2018-12-28 NOTE — Telephone Encounter (Signed)
Patient's daughter calling in reference to new medication patient is to start. Patient confused as to if he is to stop Plaquenil, and only MTX. Or is patient to take both medications? Please call to advise.

## 2018-12-31 ENCOUNTER — Telehealth: Payer: Self-pay | Admitting: Pharmacist

## 2018-12-31 NOTE — Telephone Encounter (Signed)
Received voicemail from daughter [name] with questions regarding Mr. Munyan medication regimen.  He was unsure if he was to continue taking Plaquenil and prednisone along with new prescription of methotrexate.  Informed that yes he is to continue Plaquenil and prednisone.  All questions encouraged and answered.  Instructed patient to call with any further questions or concerns.  Mariella Saa, PharmD, [facility] Rheumatology Clinical Pharmacist  12/31/2018 9:36 AM

## 2019-01-09 ENCOUNTER — Telehealth: Payer: Self-pay | Admitting: *Deleted

## 2019-01-09 DIAGNOSIS — M0600 Rheumatoid arthritis without rheumatoid factor, unspecified site: Secondary | ICD-10-CM | POA: Diagnosis not present

## 2019-01-09 DIAGNOSIS — R6882 Decreased libido: Secondary | ICD-10-CM | POA: Diagnosis not present

## 2019-01-09 DIAGNOSIS — E559 Vitamin D deficiency, unspecified: Secondary | ICD-10-CM

## 2019-01-09 NOTE — Telephone Encounter (Signed)
-----   Message from Ofilia Neas, PA-C sent at 01/09/2019 11:30 AM EDT ----- Please place future lab order for vitamin D.  ----- Message ----- From: Doree Albee, MD Sent: 01/09/2019  11:00 AM EDT To: Bo Merino, MD  Hi there!. This mutual patient is due to see you soon and have blood work done.  He is taking vitamin D3 10,000 units daily (on epic it is recorded as 1000 units daily which is incorrect).  I would be grateful if you could make sure he gets vitamin D levels checked.  Thanks.

## 2019-01-09 NOTE — Telephone Encounter (Signed)
Order placed for Vitamin D to be drawn with next labs.

## 2019-01-11 ENCOUNTER — Other Ambulatory Visit: Payer: Self-pay

## 2019-01-11 DIAGNOSIS — Z79899 Other long term (current) drug therapy: Secondary | ICD-10-CM | POA: Diagnosis not present

## 2019-01-11 DIAGNOSIS — E559 Vitamin D deficiency, unspecified: Secondary | ICD-10-CM | POA: Diagnosis not present

## 2019-01-12 LAB — COMPLETE METABOLIC PANEL WITH GFR
AG Ratio: 1.7 (calc) (ref 1.0–2.5)
ALT: 17 U/L (ref 9–46)
AST: 23 U/L (ref 10–35)
Albumin: 4.2 g/dL (ref 3.6–5.1)
Alkaline phosphatase (APISO): 51 U/L (ref 35–144)
BUN: 16 mg/dL (ref 7–25)
CO2: 25 mmol/L (ref 20–32)
Calcium: 9.4 mg/dL (ref 8.6–10.3)
Chloride: 104 mmol/L (ref 98–110)
Creat: 0.77 mg/dL (ref 0.70–1.18)
GFR, Est African American: 102 mL/min/{1.73_m2} (ref >=60)
GFR, Est Non African American: 88 mL/min/{1.73_m2} (ref >=60)
Globulin: 2.5 g/dL (calc) (ref 1.9–3.7)
Glucose, Bld: 95 mg/dL (ref 65–99)
Potassium: 4.4 mmol/L (ref 3.5–5.3)
Sodium: 138 mmol/L (ref 135–146)
Total Bilirubin: 0.6 mg/dL (ref 0.2–1.2)
Total Protein: 6.7 g/dL (ref 6.1–8.1)

## 2019-01-12 LAB — CBC WITH DIFFERENTIAL/PLATELET
Absolute Monocytes: 618 cells/uL (ref 200–950)
Basophils Absolute: 72 cells/uL (ref 0–200)
Basophils Relative: 1.1 %
Eosinophils Absolute: 156 cells/uL (ref 15–500)
Eosinophils Relative: 2.4 %
HCT: 39.7 % (ref 38.5–50.0)
Hemoglobin: 13.5 g/dL (ref 13.2–17.1)
Lymphs Abs: 2119 cells/uL (ref 850–3900)
MCH: 32.3 pg (ref 27.0–33.0)
MCHC: 34 g/dL (ref 32.0–36.0)
MCV: 95 fL (ref 80.0–100.0)
MPV: 11 fL (ref 7.5–12.5)
Monocytes Relative: 9.5 %
Neutro Abs: 3536 cells/uL (ref 1500–7800)
Neutrophils Relative %: 54.4 %
Platelets: 192 10*3/uL (ref 140–400)
RBC: 4.18 10*6/uL — ABNORMAL LOW (ref 4.20–5.80)
RDW: 13.1 % (ref 11.0–15.0)
Total Lymphocyte: 32.6 %
WBC: 6.5 10*3/uL (ref 3.8–10.8)

## 2019-01-12 LAB — VITAMIN D 25 HYDROXY (VIT D DEFICIENCY, FRACTURES): Vit D, 25-Hydroxy: 65 ng/mL (ref 30–100)

## 2019-01-14 ENCOUNTER — Other Ambulatory Visit: Payer: Self-pay | Admitting: Physician Assistant

## 2019-01-14 DIAGNOSIS — M0609 Rheumatoid arthritis without rheumatoid factor, multiple sites: Secondary | ICD-10-CM

## 2019-01-14 NOTE — Telephone Encounter (Signed)
Last Visit: 12/24/18 Next Visit: 02/22/19 Labs: 01/11/19 CMP WNL. RBC count is borderline low. Rest of CBC WNL. Eye exam: 11/16/2018 WNL   Okay to refill per Dr.Deveswhar

## 2019-01-14 NOTE — Progress Notes (Signed)
CMP WNL.  RBC count is borderline low.  Rest of CBC WNL.  We will continue to monitor.

## 2019-01-30 DIAGNOSIS — Z23 Encounter for immunization: Secondary | ICD-10-CM | POA: Diagnosis not present

## 2019-02-05 DIAGNOSIS — H5200 Hypermetropia, unspecified eye: Secondary | ICD-10-CM | POA: Diagnosis not present

## 2019-02-08 NOTE — Progress Notes (Signed)
Office Visit Note  Patient: Scott Chang             Date of Birth: 08/14/1940           MRN: [id number]             PCP: Doree Albee, MD Referring: Doree Albee, MD Visit Date: 02/22/2019 Occupation: @GUAROCC @  Subjective:  Shortness of breath   History of Present Illness: Scott Chang is a 78 y.o. male with history of seronegative rheumatoid arthritis, osteoarthritis, and DDD.  Patient was started on Plaquenil in May 2020.  He is taking Plaquenil 200 mg 1 tablet by mouth twice daily.  At his last visit on December 24, 2018 he was switched from oral methotrexate to injectable methotrexate.  He has been injecting methotrexate 0.6 mL once weekly.  He has not noticed any improvement since starting on methotrexate injections or Plaquenil.  He continues to have pain in multiple joints including bilateral shoulders, bilateral hands, bilateral knees, bilateral ankles, bilateral feet.  He denies any joint swelling.  He states the shoulder pain and hand pain is worse with activity.  He experiences severe pain at night in both shoulder joints.  He has been experiencing increased fatigue and shortness of breath with exertion and at rest recently.    Activities of Daily Living:  Patient reports morning stiffness for several hours.   Patient Reports nocturnal pain.  Difficulty dressing/grooming: Denies Difficulty climbing stairs: Reports Difficulty getting out of chair: Reports Difficulty using hands for taps, buttons, cutlery, and/or writing: Reports  Review of Systems  Constitutional: Positive for appetite change and fatigue.  HENT: Negative for mouth sores, mouth dryness and nose dryness.   Eyes: Negative for itching and dryness.  Respiratory: Positive for shortness of breath. Negative for wheezing.   Cardiovascular: Negative for chest pain and palpitations.  Gastrointestinal: Positive for nausea. Negative for blood in stool, constipation and diarrhea.  Endocrine: Negative for  increased urination.  Genitourinary: Negative for difficulty urinating and painful urination.  Musculoskeletal: Positive for arthralgias, joint pain, joint swelling and morning stiffness.  Skin: Negative for rash and redness.  Allergic/Immunologic: Negative for susceptible to infections.  Neurological: Positive for dizziness, memory loss and weakness. Negative for headaches.  Hematological: Positive for bruising/bleeding tendency.  Psychiatric/Behavioral: Positive for sleep disturbance. Negative for confusion.    PMFS History:  Patient Active Problem List   Diagnosis Date Noted  . Primary osteoarthritis of both feet 08/08/2018  . Primary osteoarthritis of right knee 08/08/2018  . Other fatigue 07/27/2018  . History of anxiety 07/27/2018  . History of colonic polyps 07/27/2018  . History of hyperlipidemia 07/27/2018  . Vitamin D deficiency 07/27/2018  . Neuropathy 07/27/2018  . DDD (degenerative disc disease), cervical 07/27/2018  . DDD (degenerative disc disease), lumbar 07/27/2018  . Primary osteoarthritis of both hands 07/27/2018    Past Medical History:  Diagnosis Date  . Anxiety   . Arthritis   . Colon polyps   . Hypercholesteremia   . Neuropathy     Family History  Problem Relation Age of Onset  . Chronic Renal Failure Mother   . Heart attack Father   . Cancer Brother   . Osteoarthritis Daughter   . Colon cancer Neg Hx    Past Surgical History:  Procedure Laterality Date  . APPENDECTOMY    . CHOLECYSTECTOMY    . COLONOSCOPY N/A 05/09/2013   Procedure: COLONOSCOPY;  Surgeon: Rogene Houston, MD;  Location: [facility];  Service: Endoscopy;  Laterality: N/A;  1030  . COLONOSCOPY W/ BIOPSIES AND POLYPECTOMY    . Left knee arthroscopy    . Left shoulder arthroscopy     X 2   Social History   Social History Narrative  . Not on file   Immunization History  Administered Date(s) Administered  . Pneumococcal Polysaccharide-23 08/27/2018  . Td 11/16/2018  .  Zoster Recombinat (Shingrix) 08/27/2018     Objective: Vital Signs: BP 128/77 (BP Location: Left Arm, Patient Position: Sitting, Cuff Size: Normal)   Pulse 89   Resp 15   Ht 5' 10.5" (1.791 m)   Wt 156 lb (70.8 kg)   BMI 22.07 kg/m    Physical Exam Vitals signs and nursing note reviewed.  Constitutional:      Appearance: He is well-developed.  HENT:     Head: Normocephalic and atraumatic.  Eyes:     Conjunctiva/sclera: Conjunctivae normal.     Pupils: Pupils are equal, round, and reactive to light.  Neck:     Musculoskeletal: Normal range of motion and neck supple.  Cardiovascular:     Rate and Rhythm: Normal rate and regular rhythm.     Heart sounds: Normal heart sounds.  Pulmonary:     Effort: Pulmonary effort is normal.     Breath sounds: Normal breath sounds.  Abdominal:     General: Bowel sounds are normal.     Palpations: Abdomen is soft.  Skin:    General: Skin is warm and dry.     Capillary Refill: Capillary refill takes less than 2 seconds.  Neurological:     Mental Status: He is alert and oriented to person, place, and time.  Psychiatric:        Behavior: Behavior normal.      Musculoskeletal Exam: C-spine limited range of motion with discomfort.  Thoracic and lumbar spine good range of motion.  Midline spinal tenderness in the lumbar region.  No SI joint tenderness.  He has painful range of motion of bilateral shoulder joints.  Elbow joints and wrist joints have good range of motion with no tenderness or inflammation.  MCPs, PIPs, DIPs have good range of motion with discomfort.  He has tenderness of all MCP and PIP joints.  Hip joints have good range of motion with no discomfort.  Knee joints have painful range of motion with crepitus bilaterally.  No warmth or effusion of knee joints were noted.  Ankle joints have good range of motion with no synovitis.  CDAI Exam: CDAI Score: 24.2  Patient Global: 6 mm; Provider Global: 6 mm Swollen: 0 ; Tender: 25  Joint  Exam      Right  Left  Glenohumeral   Tender   Tender  MCP 1   Tender   Tender  MCP 2   Tender   Tender  MCP 3   Tender   Tender  MCP 4      Tender  MCP 5   Tender   Tender  IP   Tender   Tender  PIP 2   Tender   Tender  PIP 3   Tender   Tender  PIP 4   Tender   Tender  PIP 5   Tender   Tender  Knee   Tender   Tender  Ankle   Tender   Tender     Investigation: No additional findings.  Imaging: No results found.  Recent Labs: Lab Results  Component Value Date   WBC 5.3  02/15/2019   HGB 13.4 02/15/2019   PLT 194 02/15/2019   NA 139 02/15/2019   K 5.0 02/15/2019   CL 107 02/15/2019   CO2 25 02/15/2019   GLUCOSE 102 (H) 02/15/2019   BUN 11 02/15/2019   CREATININE 0.82 02/15/2019   BILITOT 0.6 02/15/2019   AST 21 02/15/2019   ALT 18 02/15/2019   PROT 6.7 02/15/2019   CALCIUM 9.0 02/15/2019   GFRAA 100 02/15/2019   QFTBGOLDPLUS NEGATIVE 07/27/2018    Speciality Comments: PLQ Eye Exam: 11/16/2018 WNL Follow up in 1 year   Procedures:  Large Joint Inj: bilateral glenohumeral on 02/22/2019 10:29 AM Indications: pain Details: 27 G 1.5 in needle, posterior approach  Arthrogram: No  Medications (Right): 1 mL lidocaine 1 %; 40 mg triamcinolone acetonide 40 MG/ML Aspirate (Right): 0 mL Medications (Left): 1 mL lidocaine 1 %; 40 mg triamcinolone acetonide 40 MG/ML Aspirate (Left): 0 mL Outcome: tolerated well, no immediate complications Procedure, treatment alternatives, risks and benefits explained, specific risks discussed. Consent was given by the patient. Immediately prior to procedure a time out was called to verify the correct patient, procedure, equipment, support staff and site/side marked as required. Patient was prepped and draped in the usual sterile fashion.     Allergies: Bee venom and Penicillins   Assessment / Plan:     Visit Diagnoses: Rheumatoid arthritis of multiple sites with negative rheumatoid factor ([place]) - X-rays and ultrasound on 07/27/18 were  consistent with inflammatory arthritis: He has no obvious synovitis on exam.  He continues to have chronic pain in bilateral shoulder joints, bilateral hands, bilateral knee joints, bilateral ankle joints, bilateral feet.  He has not noticed any improvement since starting on Plaquenil or methotrexate.  He initially was started on Plaquenil in May 2020 and then he started on injectable methotrexate in July 2020.  He has been injecting methotrexate 0.6 mL subcutaneously every week.  He has not noticed any improvement in his symptoms.  He is having difficulties with ADLs.  The pain in his shoulder joints and hands is worse with activity.  He is also been experiencing increased fatigue and shortness of breath with exertion and at rest.  He was advised to discontinue methotrexate at this time.  A chest x-ray was ordered today.  We will call him with those results. He will continue taking Plaquenil as prescribed.  He does not need any refills at this time.  We discussed adding on Cymbalta to help with pain relief but he declined at this time.  Bilateral shoulder joint cortisone injections were performed today.  He will follow-up in the office in 1 month.- Plan: DG Chest 2 View  High risk medication use -Plaquenil 200 mg 1 tablet by mouth twice daily (started May 2020), methotrexate 0.6 mL subcutaneous weekly once weekly started in July 2020.  He was advised to hold methotrexate due to experiencing increased fatigue and shortness of breath with exertion and at rest.  Chest x-ray was ordered today.  Last Plaquenil eye exam normal on 11/16/2018. Most recent CBC/CMP within normal limits on 02/15/2019.  Due for CBC/CMP in December and will monitor every 3 months.- Plan: DG Chest 2 View  Primary osteoarthritis of both hands: He has PIP and DIP synovial thickening consistent with osteoarthritis of bilateral hands.  He has tenderness but no obvious synovitis.  He has been having difficulties with ADLs.  He has been having  increased stiffness in both hands.  Joint protection and muscle strengthening were discussed  Primary osteoarthritis of right knee: Chronic pain.  He has discomfort and stiffness with range of motion.  He has right knee crepitus on exam.  No warmth or effusion was noted.  Primary osteoarthritis of both feet: He has chronic pain in bilateral feet.  He has no tenderness or synovitis.  He wears proper fitting shoes.  DDD (degenerative disc disease), cervical: He has limited range of motion with discomfort.  He is been experiencing increased neck pain and stiffness.  He has no symptoms of radiculopathy at this time.  DDD (degenerative disc disease), lumbar: Chronic pain.  Shortness of breath - He has been experiencing increased shortness of breath with the exertion and at rest since starting on methotrexate.  He is also been having worsening fatigue.  He was advised to hold methotrexate at this time.  Order for chest x-ray was placed today.  We will call him with those results.  Plan: DG Chest 2 View  Chronic pain of both shoulders: He has painful range of motion of bilateral shoulder joints on exam.  No warmth or effusion was noted on exam.  He has not noticed any improvement in his shoulder joint pain since starting on Plaquenil or methotrexate.  He has difficulties with ADLs as well as nocturnal pain.  He requested cortisone injections in bilateral shoulder joints.  He tolerated the procedure well.  Procedure notes were completed above.  Other medical conditions are listed as follows:  History of colonic polyps  History of hyperlipidemia  Vitamin D deficiency  History of anxiety  Neuropathy  Orders: Orders Placed This Encounter  Procedures  . Large Joint Inj  . DG Chest 2 View   No orders of the defined types were placed in this encounter.   Face-to-face time spent with patient was 30 minutes. Greater than 50% of time was spent in counseling and coordination of care.  Follow-Up  Instructions: Return in about 4 weeks (around 03/22/2019) for Rheumatoid arthritis, Osteoarthritis.   Ofilia Neas, PA-C   I examined and evaluated the patient with [name] PA.  Patient complains of a lot of joint pain and discomfort.  He had no synovitis on examination.  He complains of bilateral shoulder joint pain.  He states he has been having nocturnal pain at nighttime.  He had good range of motion of his shoulder joints.  Per his request after informed consent was obtained bilateral shoulder joints were injected with cortisone as described above.  He is also experiencing some shortness of breath.  Have advised him to discontinue methotrexate.  We will get a chest x-ray today.  We will see him back in 4 weeks.  The plan of care was discussed as noted above.  Bo Merino, MD  Note - This record has been created using Editor, commissioning.  Chart creation errors have been sought, but may not always  have been located. Such creation errors do not reflect on  the standard of medical care.

## 2019-02-15 ENCOUNTER — Other Ambulatory Visit: Payer: Self-pay | Admitting: Rheumatology

## 2019-02-15 ENCOUNTER — Other Ambulatory Visit: Payer: Self-pay

## 2019-02-15 DIAGNOSIS — M0609 Rheumatoid arthritis without rheumatoid factor, multiple sites: Secondary | ICD-10-CM

## 2019-02-15 DIAGNOSIS — Z79899 Other long term (current) drug therapy: Secondary | ICD-10-CM | POA: Diagnosis not present

## 2019-02-15 NOTE — Telephone Encounter (Signed)
Last Visit: 12/24/18 Next Visit: 02/22/19 Labs: 01/11/19 CMP WNL. RBC count is borderline low. Rest of CBC WNL. Eye exam: 11/16/2018 WNL   Okay to refill per Dr.Deveswhar  

## 2019-02-16 LAB — CBC WITH DIFFERENTIAL/PLATELET
Absolute Monocytes: 519 cells/uL (ref 200–950)
Basophils Absolute: 69 cells/uL (ref 0–200)
Basophils Relative: 1.3 %
Eosinophils Absolute: 133 cells/uL (ref 15–500)
Eosinophils Relative: 2.5 %
HCT: 39.3 % (ref 38.5–50.0)
Hemoglobin: 13.4 g/dL (ref 13.2–17.1)
Lymphs Abs: 1680 cells/uL (ref 850–3900)
MCH: 32.4 pg (ref 27.0–33.0)
MCHC: 34.1 g/dL (ref 32.0–36.0)
MCV: 95.2 fL (ref 80.0–100.0)
MPV: 11 fL (ref 7.5–12.5)
Monocytes Relative: 9.8 %
Neutro Abs: 2899 cells/uL (ref 1500–7800)
Neutrophils Relative %: 54.7 %
Platelets: 194 10*3/uL (ref 140–400)
RBC: 4.13 10*6/uL — ABNORMAL LOW (ref 4.20–5.80)
RDW: 13.8 % (ref 11.0–15.0)
Total Lymphocyte: 31.7 %
WBC: 5.3 10*3/uL (ref 3.8–10.8)

## 2019-02-16 LAB — COMPLETE METABOLIC PANEL WITH GFR
AG Ratio: 1.6 (calc) (ref 1.0–2.5)
ALT: 18 U/L (ref 9–46)
AST: 21 U/L (ref 10–35)
Albumin: 4.1 g/dL (ref 3.6–5.1)
Alkaline phosphatase (APISO): 62 U/L (ref 35–144)
BUN: 11 mg/dL (ref 7–25)
CO2: 25 mmol/L (ref 20–32)
Calcium: 9 mg/dL (ref 8.6–10.3)
Chloride: 107 mmol/L (ref 98–110)
Creat: 0.82 mg/dL (ref 0.70–1.18)
GFR, Est African American: 100 mL/min/{1.73_m2} (ref >=60)
GFR, Est Non African American: 86 mL/min/{1.73_m2} (ref >=60)
Globulin: 2.6 g/dL (calc) (ref 1.9–3.7)
Glucose, Bld: 102 mg/dL — ABNORMAL HIGH (ref 65–99)
Potassium: 5 mmol/L (ref 3.5–5.3)
Sodium: 139 mmol/L (ref 135–146)
Total Bilirubin: 0.6 mg/dL (ref 0.2–1.2)
Total Protein: 6.7 g/dL (ref 6.1–8.1)

## 2019-02-18 NOTE — Progress Notes (Signed)
RBC count is borderline low.  Rest of CBC WNL.  Glucose is 102. Rest of CMP WNL

## 2019-02-22 ENCOUNTER — Ambulatory Visit (INDEPENDENT_AMBULATORY_CARE_PROVIDER_SITE_OTHER): Payer: Medicare HMO | Admitting: Rheumatology

## 2019-02-22 ENCOUNTER — Other Ambulatory Visit: Payer: Self-pay

## 2019-02-22 ENCOUNTER — Ambulatory Visit (HOSPITAL_COMMUNITY)
Admission: RE | Admit: 2019-02-22 | Discharge: 2019-02-22 | Disposition: A | Discharge status: Home / Self Care | Payer: Medicare HMO | Source: Ambulatory Visit | Attending: Physician Assistant | Admitting: Physician Assistant

## 2019-02-22 ENCOUNTER — Encounter: Payer: Self-pay | Admitting: Rheumatology

## 2019-02-22 VITALS — BP 128/77 | HR 89 | Resp 15 | Ht 70.5 in | Wt 156.0 lb

## 2019-02-22 DIAGNOSIS — M25511 Pain in right shoulder: Secondary | ICD-10-CM

## 2019-02-22 DIAGNOSIS — E559 Vitamin D deficiency, unspecified: Secondary | ICD-10-CM

## 2019-02-22 DIAGNOSIS — M19072 Primary osteoarthritis, left ankle and foot: Secondary | ICD-10-CM

## 2019-02-22 DIAGNOSIS — M5136 Other intervertebral disc degeneration, lumbar region: Secondary | ICD-10-CM | POA: Diagnosis not present

## 2019-02-22 DIAGNOSIS — M0609 Rheumatoid arthritis without rheumatoid factor, multiple sites: Secondary | ICD-10-CM | POA: Insufficient documentation

## 2019-02-22 DIAGNOSIS — M5030 Other cervical disc degeneration, unspecified cervical region: Secondary | ICD-10-CM

## 2019-02-22 DIAGNOSIS — M25512 Pain in left shoulder: Secondary | ICD-10-CM

## 2019-02-22 DIAGNOSIS — M19071 Primary osteoarthritis, right ankle and foot: Secondary | ICD-10-CM | POA: Diagnosis not present

## 2019-02-22 DIAGNOSIS — G8929 Other chronic pain: Secondary | ICD-10-CM

## 2019-02-22 DIAGNOSIS — Z79899 Other long term (current) drug therapy: Secondary | ICD-10-CM

## 2019-02-22 DIAGNOSIS — M19041 Primary osteoarthritis, right hand: Secondary | ICD-10-CM

## 2019-02-22 DIAGNOSIS — Z8659 Personal history of other mental and behavioral disorders: Secondary | ICD-10-CM

## 2019-02-22 DIAGNOSIS — Z8639 Personal history of other endocrine, nutritional and metabolic disease: Secondary | ICD-10-CM

## 2019-02-22 DIAGNOSIS — M1711 Unilateral primary osteoarthritis, right knee: Secondary | ICD-10-CM | POA: Diagnosis not present

## 2019-02-22 DIAGNOSIS — Z86010 Personal history of colonic polyps: Secondary | ICD-10-CM

## 2019-02-22 DIAGNOSIS — R0602 Shortness of breath: Secondary | ICD-10-CM | POA: Diagnosis not present

## 2019-02-22 DIAGNOSIS — G629 Polyneuropathy, unspecified: Secondary | ICD-10-CM

## 2019-02-22 DIAGNOSIS — M19042 Primary osteoarthritis, left hand: Secondary | ICD-10-CM

## 2019-02-26 NOTE — Progress Notes (Signed)
CXR did not reveal any active cardiopulmonary disease.

## 2019-03-08 NOTE — Progress Notes (Signed)
Office Visit Note  Patient: Scott Chang             Date of Birth: 11-21-1940           MRN: [id number]             PCP: Doree Albee, MD Referring: Doree Albee, MD Visit Date: 03/22/2019 Occupation: @GUAROCC @  Subjective:  Pain in multiple joints    History of Present Illness: Scott Chang is a 78 y.o. male with history of seronegative rheumatoid arthritis.  He is taking Plaquenil 200 mg 1 tablet by mouth BID.  He discontinued MTX in September, and he has not noticed any difference since coming off of MTX.  He has not found PLQ or MTX to be effective.  He was experiencing SOB with exertion and had a CXR that was normal, but he did not want to restart on MTX.  He has chronic pain in multiple joints including both hands and both feet.  He states that the cortisone injections in both shoulder joints performed on 02/22/19 has alleviated most of his discomfort.  He has chronic neck pain and stiffness. He remains very active, but he states he is unable to do some of the activities he used to do.    Activities of Daily Living:  Patient joint stiffness all day  Patient Reports nocturnal pain.  Difficulty dressing/grooming: Reports Difficulty climbing stairs: Reports Difficulty getting out of chair: Reports Difficulty using hands for taps, buttons, cutlery, and/or writing: Reports  Review of Systems  Constitutional: Positive for fatigue. Negative for night sweats.  HENT: Negative for mouth sores, mouth dryness and nose dryness.   Eyes: Negative for redness, itching and dryness.  Respiratory: Negative for cough, hemoptysis, shortness of breath, wheezing and difficulty breathing.   Cardiovascular: Negative for chest pain, palpitations, hypertension, irregular heartbeat and swelling in legs/feet.  Gastrointestinal: Negative for blood in stool, constipation and diarrhea.  Endocrine: Negative for increased urination.  Genitourinary: Negative for difficulty urinating and painful  urination.  Musculoskeletal: Positive for arthralgias, joint pain and morning stiffness. Negative for joint swelling, myalgias, muscle weakness, muscle tenderness and myalgias.  Skin: Negative for color change, rash, hair loss, nodules/bumps, skin tightness, ulcers and sensitivity to sunlight.  Allergic/Immunologic: Negative for susceptible to infections.  Neurological: Negative for dizziness, fainting, headaches and night sweats.  Hematological: Negative for swollen glands.  Psychiatric/Behavioral: Positive for depressed mood. Negative for confusion and sleep disturbance. The patient is not nervous/anxious.     PMFS History:  Patient Active Problem List   Diagnosis Date Noted   Primary osteoarthritis of both feet 08/08/2018   Primary osteoarthritis of right knee 08/08/2018   Other fatigue 07/27/2018   History of anxiety 07/27/2018   History of colonic polyps 07/27/2018   History of hyperlipidemia 07/27/2018   Vitamin D deficiency 07/27/2018   Neuropathy 07/27/2018   DDD (degenerative disc disease), cervical 07/27/2018   DDD (degenerative disc disease), lumbar 07/27/2018   Primary osteoarthritis of both hands 07/27/2018    Past Medical History:  Diagnosis Date   Anxiety    Arthritis    Colon polyps    Hypercholesteremia    Neuropathy     Family History  Problem Relation Age of Onset   Chronic Renal Failure Mother    Heart attack Father    Cancer Brother    Osteoarthritis Daughter    Colon cancer Neg Hx    Past Surgical History:  Procedure Laterality Date   APPENDECTOMY  CHOLECYSTECTOMY     COLONOSCOPY N/A 05/09/2013   Procedure: COLONOSCOPY;  Surgeon: Rogene Houston, MD;  Location: [facility];  Service: Endoscopy;  Laterality: N/A;  88   COLONOSCOPY W/ BIOPSIES AND POLYPECTOMY     Left knee arthroscopy     Left shoulder arthroscopy     X 2   Social History   Social History Narrative   Not on file   Immunization History    Administered Date(s) Administered   Pneumococcal Polysaccharide-23 08/27/2018   Td 11/16/2018   Zoster Recombinat (Shingrix) 08/27/2018     Objective: Vital Signs: BP (!) 149/74 (BP Location: Left Arm, Patient Position: Sitting, Cuff Size: Normal)    Pulse 72    Resp 13    Ht 5' 10.75" (1.797 m)    Wt 155 lb (70.3 kg)    BMI 21.77 kg/m    Physical Exam Vitals signs and nursing note reviewed.  Constitutional:      Appearance: He is well-developed.  HENT:     Head: Normocephalic and atraumatic.  Eyes:     Conjunctiva/sclera: Conjunctivae normal.     Pupils: Pupils are equal, round, and reactive to light.  Neck:     Musculoskeletal: Normal range of motion and neck supple.  Cardiovascular:     Rate and Rhythm: Normal rate and regular rhythm.     Heart sounds: Normal heart sounds.  Pulmonary:     Effort: Pulmonary effort is normal.     Breath sounds: Normal breath sounds.  Abdominal:     General: Bowel sounds are normal.     Palpations: Abdomen is soft.  Skin:    General: Skin is warm and dry.     Capillary Refill: Capillary refill takes less than 2 seconds.  Neurological:     Mental Status: He is alert and oriented to person, place, and time.  Psychiatric:        Behavior: Behavior normal.      Musculoskeletal Exam: C-spine limited ROM.  Shoulder joints, elbow joints, wrist joints, MCPs, PIPs, and DIPs good ROM with no synovitis.  PIP and DIP synovial thickening consistent with osteoarthritis.  PIP tenderness but no synovitis.  Hip joints, knee joints, ankle joints, MTPs, PIPs, and DIPs good ROM with no synovitis.  No warmth or effusion of knee joints.  No tenderness or swelling of ankle joints.  CDAI Exam: CDAI Score: -- Patient Global: --; Provider Global: -- Swollen: --; Tender: -- Joint Exam   No joint exam has been documented for this visit   There is currently no information documented on the homunculus. Go to the Rheumatology activity and complete the  homunculus joint exam.  Investigation: No additional findings.  Imaging: Dg Chest 2 View  Result Date: 02/22/2019 CLINICAL DATA:  Shortness of breath for 2 weeks EXAM: CHEST - 2 VIEW COMPARISON:  09/12/2017 FINDINGS: The heart size and mediastinal contours are within normal limits. Both lungs are clear without focal airspace consolidation, pleural effusion, or pneumothorax. Degenerative changes of the bilateral shoulders. IMPRESSION: No active cardiopulmonary disease. Electronically Signed   By: Davina Poke M.D.   On: 02/22/2019 16:23    Recent Labs: Lab Results  Component Value Date   WBC 5.3 02/15/2019   HGB 13.4 02/15/2019   PLT 194 02/15/2019   NA 139 02/15/2019   K 5.0 02/15/2019   CL 107 02/15/2019   CO2 25 02/15/2019   GLUCOSE 102 (H) 02/15/2019   BUN 11 02/15/2019   CREATININE 0.82 02/15/2019  BILITOT 0.6 02/15/2019   AST 21 02/15/2019   ALT 18 02/15/2019   PROT 6.7 02/15/2019   CALCIUM 9.0 02/15/2019   GFRAA 100 02/15/2019   QFTBGOLDPLUS NEGATIVE 07/27/2018    Speciality Comments: PLQ Eye Exam: 11/16/2018 WNL Follow up in 1 year   Procedures:  No procedures performed Allergies: Bee venom and Penicillins   Assessment / Plan:     Visit Diagnoses: Rheumatoid arthritis of multiple sites with negative rheumatoid factor ([place]) - X-rays and ultrasound on 07/27/18 were consistent with inflammatory arthritis: He has no synovitis on exam.  He has persistent pain in both hands and both feet.  He has PIP synovial thickening and tenderness but no synovitis.  He was started on Plaquenil in May 2020 and has not noticed any improvement.  He started on methotrexate in July 2020 but discontinued in September due to inadequate response as well as developing increased shortness of breath with exertion.  He has not noticed any difference in his joint pain since coming off of methotrexate.  He will continue taking Plaquenil monotherapy as prescribed.  We will add on Cymbalta 30 mg 1  capsule by mouth daily to help with pain management.  He was advised to notify us if he develops any side effects of Cymbalta.  Indications, contraindications, potential side effects of Cymbalta were discussed today.  Joint protection and muscle strengthening were discussed.  I discussed the importance of staying active and exercising on a regular basis.  He will follow-up in the office in 1 month.    High risk medication use - Plaquenil 200 mg 1 tablet by mouth twice daily (started May 2020).  Last Plaquenil eye exam normal on 11/16/2018.    He was started on methotrexate in July 2020 and discontinued in September 2020 due to an inadequate response and noticing increased shortness of breath with exertion.  Chest x-ray did not show any acute abnormalities on 02/22/2019.  CBC and CMP were drawn on 02/15/2019.  We will continue to monitor lab work every 5 months to monitor for drug toxicity.  Shortness of breath - Chest x-ray 9/4/202 did not reveal any active cardiopulmonary disease.  His symptoms have resolved since discontinuing methotrexate.  Primary osteoarthritis of both hands: He has PIP and DIP synovial thickening consistent with osteoarthritis of both hands.  He has tenderness of all PIP joints but no synovitis is noted.  He has complete fist formation bilaterally.  He has chronic pain in both hands.  He has been having increased difficulty with his activities of daily living.  Joint protection and muscle strengthening were discussed.  We also will be starting him on Cymbalta 30 mg 1 capsule by mouth daily to help with pain management.  Primary osteoarthritis of right knee: He has good range of motion with no discomfort.  No warmth or effusion was noted.  Primary osteoarthritis of both feet: He experiences chronic pain in both feet.  He wears proper fitting shoes.  He has no joint swelling at this time.  DDD (degenerative disc disease), cervical: He has limited range of motion with discomfort.  He  experiences chronic neck pain and stiffness.  He has no symptoms of radiculopathy at this time.  DDD (degenerative disc disease), lumbar: No midline spinal tenderness.  He has no symptoms of radiculopathy.  Other medical conditions are listed as follows:  History of anxiety  History of hyperlipidemia  History of colonic polyps  Vitamin D deficiency  Neuropathy  Orders: No orders of the  defined types were placed in this encounter.  Meds ordered this encounter  Medications   DULoxetine (CYMBALTA) 30 MG capsule    Sig: Take 1 capsule (30 mg total) by mouth daily.    Dispense:  30 capsule    Refill:  1    Face-to-face time spent with patient was 30 minutes. Greater than 50% of time was spent in counseling and coordination of care.  Follow-Up Instructions: Return in about 4 weeks (around 04/19/2019) for Rheumatoid arthritis, Osteoarthritis, DDD.   Scott Neas, PA-C   I examined and evaluated the patient with Scott Sams PA.  Patient had no synovitis on my examination today.  It appears most of his discomfort is coming from underlying osteoarthritis.  He also mentioned that he is feeling some depression due to his limitations.  We had detailed discussion with the patient regarding the use of Cymbalta and the pain management.  After reviewing indications side effects contraindications we placed him on Cymbalta 30 mg p.o. daily.  The plan of care was discussed as noted above.  Bo Merino, MD Note - This record has been created using Editor, commissioning.  Chart creation errors have been sought, but may not always  have been located. Such creation errors do not reflect on  the standard of medical care.

## 2019-03-22 ENCOUNTER — Encounter: Payer: Self-pay | Admitting: Physician Assistant

## 2019-03-22 ENCOUNTER — Other Ambulatory Visit: Payer: Self-pay

## 2019-03-22 ENCOUNTER — Ambulatory Visit (INDEPENDENT_AMBULATORY_CARE_PROVIDER_SITE_OTHER): Payer: Medicare HMO | Admitting: Rheumatology

## 2019-03-22 VITALS — BP 149/74 | HR 72 | Resp 13 | Ht 70.75 in | Wt 155.0 lb

## 2019-03-22 DIAGNOSIS — M1711 Unilateral primary osteoarthritis, right knee: Secondary | ICD-10-CM

## 2019-03-22 DIAGNOSIS — M19071 Primary osteoarthritis, right ankle and foot: Secondary | ICD-10-CM | POA: Diagnosis not present

## 2019-03-22 DIAGNOSIS — Z8659 Personal history of other mental and behavioral disorders: Secondary | ICD-10-CM

## 2019-03-22 DIAGNOSIS — E559 Vitamin D deficiency, unspecified: Secondary | ICD-10-CM

## 2019-03-22 DIAGNOSIS — Z79899 Other long term (current) drug therapy: Secondary | ICD-10-CM

## 2019-03-22 DIAGNOSIS — M51369 Other intervertebral disc degeneration, lumbar region without mention of lumbar back pain or lower extremity pain: Secondary | ICD-10-CM

## 2019-03-22 DIAGNOSIS — M5030 Other cervical disc degeneration, unspecified cervical region: Secondary | ICD-10-CM

## 2019-03-22 DIAGNOSIS — Z860100 Personal history of colon polyps, unspecified: Secondary | ICD-10-CM

## 2019-03-22 DIAGNOSIS — M19042 Primary osteoarthritis, left hand: Secondary | ICD-10-CM

## 2019-03-22 DIAGNOSIS — M0609 Rheumatoid arthritis without rheumatoid factor, multiple sites: Secondary | ICD-10-CM

## 2019-03-22 DIAGNOSIS — R0602 Shortness of breath: Secondary | ICD-10-CM | POA: Diagnosis not present

## 2019-03-22 DIAGNOSIS — Z8639 Personal history of other endocrine, nutritional and metabolic disease: Secondary | ICD-10-CM

## 2019-03-22 DIAGNOSIS — M19041 Primary osteoarthritis, right hand: Secondary | ICD-10-CM | POA: Diagnosis not present

## 2019-03-22 DIAGNOSIS — M5136 Other intervertebral disc degeneration, lumbar region: Secondary | ICD-10-CM | POA: Diagnosis not present

## 2019-03-22 DIAGNOSIS — M19072 Primary osteoarthritis, left ankle and foot: Secondary | ICD-10-CM

## 2019-03-22 DIAGNOSIS — G629 Polyneuropathy, unspecified: Secondary | ICD-10-CM

## 2019-03-22 MED ORDER — DULOXETINE HCL 30 MG PO CPEP: 30.0000 mg | ORAL_CAPSULE | Freq: Every day | ORAL | 1 refills | Status: DC

## 2019-04-05 NOTE — Progress Notes (Signed)
Office Visit Note  Patient: Scott Chang             Date of Birth: [birth year]           MRN: [id number]             PCP: Doree Albee, MD Referring: Doree Albee, MD Visit Date: 04/19/2019 Occupation: @GUAROCC @  Subjective:  Left knee pain   History of Present Illness: Scott Chang is a 78 y.o. male with history of seronegative rheumatoid arthritis, osteoarthritis, and DDD.  Patient continues to take Plaquenil as prescribed.  He has not noticed any improvement while taking Plaquenil.  He states that he has had increased fatigue and" generally feels bad.".  He states that he followed up with his PCP yesterday and had labs drawn for further evaluation.  He would like to discontinue Plaquenil due to being concerned that is causing most of his symptoms.  He states that he tried taking Cymbalta but could not tolerate it due to experiencing lightheadedness.  He continues to have chronic pain in both hands and both knee joints.  He denies any joint swelling at this time.  He states that bilateral shoulder joints are doing well after the cortisone injections.    Activities of Daily Living:  Patient reports morning stiffness for 3 hours.   Patient Reports nocturnal pain.  Difficulty dressing/grooming: Reports Difficulty climbing stairs: Reports Difficulty getting out of chair: Reports Difficulty using hands for taps, buttons, cutlery, and/or writing: Reports  Review of Systems  Constitutional: Positive for fatigue. Negative for night sweats.  HENT: Negative for mouth sores, mouth dryness and nose dryness.   Eyes: Positive for dryness. Negative for redness.  Respiratory: Positive for shortness of breath. Negative for cough, hemoptysis and difficulty breathing.   Cardiovascular: Negative for chest pain, palpitations, hypertension, irregular heartbeat and swelling in legs/feet.  Gastrointestinal: Negative for blood in stool, constipation and diarrhea.  Endocrine: Negative for  excessive thirst and increased urination.  Genitourinary: Negative for painful urination.  Musculoskeletal: Positive for arthralgias, joint pain, joint swelling, muscle weakness and morning stiffness. Negative for myalgias, muscle tenderness and myalgias.  Skin: Negative for color change, rash, hair loss, nodules/bumps, skin tightness, ulcers and sensitivity to sunlight.  Allergic/Immunologic: Negative for susceptible to infections.  Neurological: Negative for dizziness, fainting, memory loss, night sweats and weakness.  Hematological: Negative for bruising/bleeding tendency and swollen glands.  Psychiatric/Behavioral: Positive for sleep disturbance. Negative for depressed mood. The patient is not nervous/anxious.     PMFS History:  Patient Active Problem List   Diagnosis Date Noted   Malaise and fatigue 04/18/2019   Dyspnea on exertion 04/18/2019   HLD (hyperlipidemia) 04/18/2019   Primary osteoarthritis of both feet 08/08/2018   Primary osteoarthritis of right knee 08/08/2018   History of anxiety 07/27/2018   History of colonic polyps 07/27/2018   Vitamin D deficiency 07/27/2018   Neuropathy 07/27/2018   DDD (degenerative disc disease), cervical 07/27/2018   DDD (degenerative disc disease), lumbar 07/27/2018   Primary osteoarthritis of both hands 07/27/2018    Past Medical History:  Diagnosis Date   Anxiety    Arthritis    Colon polyps    Dyspnea on exertion 04/18/2019   HLD (hyperlipidemia) 04/18/2019   Hypercholesteremia    Malaise and fatigue 04/18/2019   Neuropathy     Family History  Problem Relation Age of Onset   Chronic Renal Failure Mother    Heart attack Father    Cancer Brother  Osteoarthritis Daughter    Colon cancer Neg Hx    Past Surgical History:  Procedure Laterality Date   APPENDECTOMY     CHOLECYSTECTOMY     COLONOSCOPY N/A 05/09/2013   Procedure: COLONOSCOPY;  Surgeon: Rogene Houston, MD;  Location: [facility];   Service: Endoscopy;  Laterality: N/A;  18   COLONOSCOPY W/ BIOPSIES AND POLYPECTOMY     Left knee arthroscopy     Left shoulder arthroscopy     X 2   Social History   Social History Narrative   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   Immunization History  Administered Date(s) Administered   Pneumococcal Polysaccharide-23 08/27/2018   Td 11/16/2018   Zoster Recombinat (Shingrix) 08/27/2018     Objective: Vital Signs: BP 111/66 (BP Location: Left Arm, Patient Position: Sitting, Cuff Size: Normal)    Pulse 75    Resp 16    Ht 5' 10.5" (1.791 m)    Wt 153 lb 3.2 oz (69.5 kg)    BMI 21.67 kg/m    Physical Exam Vitals signs and nursing note reviewed.  Constitutional:      Appearance: He is well-developed.  HENT:     Head: Normocephalic and atraumatic.  Eyes:     Conjunctiva/sclera: Conjunctivae normal.     Pupils: Pupils are equal, round, and reactive to light.  Neck:     Musculoskeletal: Normal range of motion and neck supple.  Cardiovascular:     Rate and Rhythm: Normal rate and regular rhythm.     Heart sounds: Normal heart sounds.  Pulmonary:     Effort: Pulmonary effort is normal.     Breath sounds: Normal breath sounds.  Abdominal:     General: Bowel sounds are normal.     Palpations: Abdomen is soft.  Skin:    General: Skin is warm and dry.     Capillary Refill: Capillary refill takes less than 2 seconds.  Neurological:     Mental Status: He is alert and oriented to person, place, and time.  Psychiatric:        Behavior: Behavior normal.      Musculoskeletal Exam: C-spine limited ROM. Thoracic spine and lumbar spine good ROM.  Shoulder lines, elbow joints, wrist joints, MCPs, PIPs and DIPs good range of motion no synovitis.  He has PIP and DIP synovial thickening consistent with osteoarthritis of both hands.  Hip joints have good range of motion no discomfort.  Knee joints have good range of motion with no warmth or effusion.  Ankle  joints have good range of motion no tenderness or inflammation.  CDAI Exam: CDAI Score: -- Patient Global: --; Provider Global: -- Swollen: --; Tender: -- Joint Exam   No joint exam has been documented for this visit   There is currently no information documented on the homunculus. Go to the Rheumatology activity and complete the homunculus joint exam.  Investigation: No additional findings.  Imaging: No results found.  Recent Labs: Lab Results  Component Value Date   WBC 5.3 02/15/2019   HGB 13.4 02/15/2019   PLT 194 02/15/2019   NA 139 02/15/2019   K 5.0 02/15/2019   CL 107 02/15/2019   CO2 25 02/15/2019   GLUCOSE 102 (H) 02/15/2019   BUN 11 02/15/2019   CREATININE 0.82 02/15/2019   BILITOT 0.6 02/15/2019   AST 21 02/15/2019   ALT 18 02/15/2019   PROT 6.7 02/15/2019   CALCIUM 9.0 02/15/2019   GFRAA 100 02/15/2019  QFTBGOLDPLUS NEGATIVE 07/27/2018    Speciality Comments: PLQ Eye Exam: 11/16/2018 WNL Follow up in 1 year   Procedures:  Large Joint Inj: L knee on 04/19/2019 8:51 AM Indications: pain Details: 27 G 1.5 in needle, medial approach  Arthrogram: No  Medications: 1.5 mL lidocaine 1 %; 40 mg triamcinolone acetonide 40 MG/ML Aspirate: 0 mL Outcome: tolerated well, no immediate complications Procedure, treatment alternatives, risks and benefits explained, specific risks discussed. Consent was given by the patient. Immediately prior to procedure a time out was called to verify the correct patient, procedure, equipment, support staff and site/side marked as required. Patient was prepped and draped in the usual sterile fashion.     Allergies: Bee venom and Penicillins   Assessment / Plan:     Visit Diagnoses: Rheumatoid arthritis of multiple sites with negative rheumatoid factor ([place]) - X-rays and ultrasound on 07/27/18 were consistent with inflammatory arthritis: He has no synovitis on exam.  He has been taking Plaquenil 200 mg 1 tablet by mouth BID.  He  has not noticed any improvement since starting on PLQ in May 2020.  He has been experiencing increased fatigue and" feeling generally bad."   He would like to discontinue Plaquenil at this time.  He is undergoing a thorough work-up by his PCP for further evaluation of the fatigue he has been experiencing.  He tried taking Cymbalta but developed lightheadedness and discontinued.  He previously discontinued methotrexate due to experiencing shortness of breath.  He does not want to take any immunosuppressive agents at this time.  Other treatment options were discussed today.  He continues to have chronic pain in both hands and both knee joints.  No warmth or effusion of the knee joints were noted on exam.  A left knee joint cortisone injection was performed today.  We will plan for Visco gel injections for the left knee.  He was advised to notify us if he develops increased joint pain or joint swelling.  He will follow-up in the office in 3 months.  High risk medication use - Plaquenil 200 mg 1 tablet by mouth twice daily (started May 2020).  He is planning on discontinuing Plaquenil due to experiencing increased fatigue.  He is aware that he should restart on Plaquenil if he develops increased joint pain or inflammation.  Last Plaquenil eye exam normal on 11/16/2018. MTX-d/c due to increased SOB.   Primary osteoarthritis of both hands: He has PIP and DIP synovial thickening consistent with osteoarthritis of both hands.  He has no tenderness or synovitis on exam.  He has chronic pain in both hands.  He has not noticed any improvement since starting on Plaquenil.  He plans on discontinuing due to experiencing increased fatigue.  Primary osteoarthritis of both knees: He has chronic pain in bilateral knee joints worse in the left knee.  No warmth or effusion was noted on exam today.  He has bilateral knee crepitus.  Has been having increased discomfort climbing steps and getting up from a chair.  X-rays of both knee  joints revealed severe osteoarthritis and severe chondromalacia patella.  He does not want to proceed with knee replacements at this time.  Treatment options were discussed.  He requested a left knee joint cortisone injection.  He tolerated the procedure well.  The procedure note was completed above.  Aftercare was discussed.  We will also apply for Visco gel injections for the left knee joint.  He was given a handout of knee joint exercises to perform.  Chronic pain of left knee: He presents today with acute on chronic left knee joint pain.  He has good ROM of the left knee with crepitus.  No warmth or effusion was noted on exam.  He requested a left knee joint cortisone injection.  We will apply for visco injections for the left knee joint.   Primary osteoarthritis of both feet: He has no feet pain or joint swelling at this time.   DDD (degenerative disc disease), cervical: He has limited ROM of the C-spine.  No symptoms of radiculopathy.   DDD (degenerative disc disease), lumbar: He has no lower back pain currently.   Other medical conditions are listed as follows  History of anxiety  History of colonic polyps  History of hyperlipidemia  Vitamin D deficiency  Neuropathy   Orders: Orders Placed This Encounter  Procedures   Large Joint Inj   No orders of the defined types were placed in this encounter.   Face-to-face time spent with patient was 30 minutes. Greater than 50% of time was spent in counseling and coordination of care.  Follow-Up Instructions: Return in about 3 months (around 07/20/2019) for Rheumatoid arthritis.   Ofilia Neas, PA-C  Note - This record has been created using Dragon software.  Chart creation errors have been sought, but may not always  have been located. Such creation errors do not reflect on  the standard of medical care.

## 2019-04-08 ENCOUNTER — Other Ambulatory Visit: Payer: Self-pay | Admitting: Physician Assistant

## 2019-04-08 ENCOUNTER — Other Ambulatory Visit: Payer: Self-pay | Admitting: Rheumatology

## 2019-04-08 DIAGNOSIS — M0609 Rheumatoid arthritis without rheumatoid factor, multiple sites: Secondary | ICD-10-CM

## 2019-04-18 ENCOUNTER — Encounter (INDEPENDENT_AMBULATORY_CARE_PROVIDER_SITE_OTHER): Payer: Self-pay | Admitting: Internal Medicine

## 2019-04-18 ENCOUNTER — Other Ambulatory Visit: Payer: Self-pay

## 2019-04-18 ENCOUNTER — Ambulatory Visit (INDEPENDENT_AMBULATORY_CARE_PROVIDER_SITE_OTHER): Payer: Medicare HMO | Admitting: Internal Medicine

## 2019-04-18 VITALS — BP 120/70 | HR 72 | Ht 70.5 in | Wt 153.0 lb

## 2019-04-18 DIAGNOSIS — E782 Mixed hyperlipidemia: Secondary | ICD-10-CM | POA: Diagnosis not present

## 2019-04-18 DIAGNOSIS — R6882 Decreased libido: Secondary | ICD-10-CM

## 2019-04-18 DIAGNOSIS — R5383 Other fatigue: Secondary | ICD-10-CM | POA: Diagnosis not present

## 2019-04-18 DIAGNOSIS — R5381 Other malaise: Secondary | ICD-10-CM | POA: Diagnosis not present

## 2019-04-18 DIAGNOSIS — R0609 Other forms of dyspnea: Secondary | ICD-10-CM

## 2019-04-18 DIAGNOSIS — R0600 Dyspnea, unspecified: Secondary | ICD-10-CM | POA: Diagnosis not present

## 2019-04-18 DIAGNOSIS — E785 Hyperlipidemia, unspecified: Secondary | ICD-10-CM | POA: Insufficient documentation

## 2019-04-18 HISTORY — DX: Other fatigue: R53.83

## 2019-04-18 HISTORY — DX: Dyspnea, unspecified: R06.00

## 2019-04-18 HISTORY — DX: Other forms of dyspnea: R06.09

## 2019-04-18 HISTORY — DX: Hyperlipidemia, unspecified: E78.5

## 2019-04-18 HISTORY — DX: Other malaise: R53.81

## 2019-04-18 NOTE — Progress Notes (Signed)
Metrics: Intervention Frequency ACO  Documented Smoking Status Yearly  Screened one or more times in 24 months  Cessation Counseling or  Active cessation medication Past 24 months  Past 24 months   Guideline developer: UpToDate (See UpToDate for funding source) Date Released: 2014       Wellness Office Visit  Subjective:  Patient ID: Scott Chang, male    DOB: March 06, 1941  Age: 78 y.o. MRN: [id number]  CC: This man comes in for follow-up of his symptoms which are very concerning for him for malaise and fatigue, painful/uncomfortable joints consistent with his rheumatoid arthritis and also more recently dyspnea for the last couple of months. HPI  He is being followed by rheumatology closely and was started on Cymbalta about a month ago but he was not able to tolerate it and he has discontinued this medication.  He is due to follow-up with rheumatology I think in the next day or 2. He complains of quite significant fatigue, he feels that his quality of life is rather poor. About February of this year, I did check his testosterone levels and these were in the suboptimal range.  We discussed this today also.  He does describe virtually no libido, loss of muscle mass, poor focus and concentration. Past Medical History:  Diagnosis Date  . Anxiety   . Arthritis   . Colon polyps   . Dyspnea on exertion 04/18/2019  . HLD (hyperlipidemia) 04/18/2019  . Hypercholesteremia   . Malaise and fatigue 04/18/2019  . Neuropathy       Family History  Problem Relation Age of Onset  . Chronic Renal Failure Mother   . Heart attack Father   . Cancer Brother   . Osteoarthritis Daughter   . 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   Social History   Tobacco Use  . Smoking status: Former Smoker    Packs/day: 1.00    Years: 40.00    Pack years: 40.00    Types: Cigarettes    Quit date: 06/21/1988    Years  since quitting: 30.8  . Smokeless tobacco: Never Used  Substance Use Topics  . Alcohol use: Not Currently    Current Meds  Medication Sig  . Cholecalciferol (VITAMIN D) 125 MCG (5000 UT) CAPS Take 5,000 Units by mouth 2 (two) times daily.   . folic acid (FOLVITE) 1 MG tablet Take 2 tablets (2 mg total) by mouth daily.  . hydroxychloroquine (PLAQUENIL) 200 MG tablet TAKE (1) TABLET BY MOUTH TWICE DAILY.  . Multiple Vitamin (MULTIVITAMIN WITH MINERALS) TABS tablet Take 1 tablet by mouth daily.  Vladimir Faster Glycol-Propyl Glycol (SYSTANE OP) Place 1 drop into both eyes daily as needed (Dry Eyes).       Objective:   Today's Vitals: BP 120/70   Pulse 72   Ht 5' 10.5" (1.791 m)   Wt 153 lb (69.4 kg)   BMI 21.64 kg/m  Vitals with BMI 04/18/2019 03/22/2019 02/22/2019  Height 5' 10.5" 5' 10.75" 5' 10.5"  Weight 153 lbs 155 lbs 156 lbs  BMI 21.64 [id number]  Systolic [id number]  Diastolic 70 74 77  Pulse 72 72 89     Physical Exam   He looks systemically well.  His weight is stable.  No new physical findings today.    Assessment   1. Decreased libido without sexual dysfunction   2. Malaise and fatigue  3. Dyspnea on exertion   4. Mixed hyperlipidemia       Tests ordered Orders Placed This Encounter  Procedures  . Testosterone Total,Free,Bio, Males     Plan: 1. I will check his testosterone levels again today to see if there is any change or not and he may consider testosterone therapy. 2. Otherwise, he will continue with all medications and will follow up with rheumatology as planned. 3. Follow-up in 3 months.  He will have a physical then. 4. I spent 25 to 30 minutes with this patient today, more than 50% of the time was involved in discussing testosterone therapy, in particular FDA warnings, benefits and side effects and mode of administration.   No orders of the defined types were placed in this encounter.   Doree Albee, MD

## 2019-04-19 ENCOUNTER — Ambulatory Visit (INDEPENDENT_AMBULATORY_CARE_PROVIDER_SITE_OTHER): Payer: Medicare HMO | Admitting: Physician Assistant

## 2019-04-19 ENCOUNTER — Telehealth: Payer: Self-pay | Admitting: *Deleted

## 2019-04-19 ENCOUNTER — Encounter: Payer: Self-pay | Admitting: Physician Assistant

## 2019-04-19 VITALS — BP 111/66 | HR 75 | Resp 16 | Ht 70.5 in | Wt 153.2 lb

## 2019-04-19 DIAGNOSIS — M25562 Pain in left knee: Secondary | ICD-10-CM | POA: Diagnosis not present

## 2019-04-19 DIAGNOSIS — M19072 Primary osteoarthritis, left ankle and foot: Secondary | ICD-10-CM

## 2019-04-19 DIAGNOSIS — M170 Bilateral primary osteoarthritis of knee: Secondary | ICD-10-CM | POA: Diagnosis not present

## 2019-04-19 DIAGNOSIS — G629 Polyneuropathy, unspecified: Secondary | ICD-10-CM

## 2019-04-19 DIAGNOSIS — Z79899 Other long term (current) drug therapy: Secondary | ICD-10-CM | POA: Diagnosis not present

## 2019-04-19 DIAGNOSIS — M5030 Other cervical disc degeneration, unspecified cervical region: Secondary | ICD-10-CM | POA: Diagnosis not present

## 2019-04-19 DIAGNOSIS — M5136 Other intervertebral disc degeneration, lumbar region: Secondary | ICD-10-CM

## 2019-04-19 DIAGNOSIS — M19071 Primary osteoarthritis, right ankle and foot: Secondary | ICD-10-CM | POA: Diagnosis not present

## 2019-04-19 DIAGNOSIS — Z8659 Personal history of other mental and behavioral disorders: Secondary | ICD-10-CM | POA: Diagnosis not present

## 2019-04-19 DIAGNOSIS — Z8639 Personal history of other endocrine, nutritional and metabolic disease: Secondary | ICD-10-CM

## 2019-04-19 DIAGNOSIS — G8929 Other chronic pain: Secondary | ICD-10-CM

## 2019-04-19 DIAGNOSIS — M0609 Rheumatoid arthritis without rheumatoid factor, multiple sites: Secondary | ICD-10-CM | POA: Diagnosis not present

## 2019-04-19 DIAGNOSIS — M19041 Primary osteoarthritis, right hand: Secondary | ICD-10-CM

## 2019-04-19 DIAGNOSIS — Z86010 Personal history of colonic polyps: Secondary | ICD-10-CM

## 2019-04-19 DIAGNOSIS — M19042 Primary osteoarthritis, left hand: Secondary | ICD-10-CM

## 2019-04-19 DIAGNOSIS — E559 Vitamin D deficiency, unspecified: Secondary | ICD-10-CM

## 2019-04-19 LAB — TESTOSTERONE TOTAL,FREE,BIO, MALES
Albumin: 4.3 g/dL (ref 3.6–5.1)
Sex Hormone Binding: 91 nmol/L — ABNORMAL HIGH (ref 22–77)
Testosterone, Bioavailable: 50.2 ng/dL (ref 15.0–150.0)
Testosterone, Free: 25.5 pg/mL (ref 6.0–73.0)
Testosterone: 476 ng/dL (ref 250–827)

## 2019-04-19 MED ORDER — LIDOCAINE HCL 1 % IJ SOLN
1.5000 mL | INTRAMUSCULAR | Status: AC | PRN
  Administered 2019-04-19: 1.5 mL

## 2019-04-19 MED ORDER — TRIAMCINOLONE ACETONIDE 40 MG/ML IJ SUSP
40.0000 mg | INTRAMUSCULAR | Status: AC | PRN
  Administered 2019-04-19: 40 mg via INTRA_ARTICULAR

## 2019-04-19 NOTE — Telephone Encounter (Signed)
Please apply for Left knee viscosupplementation with Scott Chang. Thank you.

## 2019-04-19 NOTE — Patient Instructions (Signed)
Journal for Nurse Practitioners, 15(4), 263-267. Retrieved March 26, 2018 from http://clinicalkey.com/[email or web address]">  Knee Exercises Ask your health care provider which exercises are safe for you. Do exercises exactly as told by your health care provider and adjust them as directed. It is normal to feel mild stretching, pulling, tightness, or discomfort as you do these exercises. Stop right away if you feel sudden pain or your pain gets worse. Do not begin these exercises until told by your health care provider. Stretching and range-of-motion exercises These exercises warm up your muscles and joints and improve the movement and flexibility of your knee. These exercises also help to relieve pain and swelling. Knee extension, prone 1. Lie on your abdomen (prone position) on a bed. 2. Place your left / right knee just beyond the edge of the surface so your knee is not on the bed. You can put a towel under your left / right thigh just above your kneecap for comfort. 3. Relax your leg muscles and allow gravity to straighten your knee (extension). You should feel a stretch behind your left / right knee. 4. Hold this position for __________ seconds. 5. Scoot up so your knee is supported between repetitions. Repeat __________ times. Complete this exercise __________ times a day. Knee flexion, active  1. Lie on your back with both legs straight. If this causes back discomfort, bend your left / right knee so your foot is flat on the floor. 2. Slowly slide your left / right heel back toward your buttocks. Stop when you feel a gentle stretch in the front of your knee or thigh (flexion). 3. Hold this position for __________ seconds. 4. Slowly slide your left / right heel back to the starting position. Repeat __________ times. Complete this exercise __________ times a day. Quadriceps stretch, prone  1. Lie on your abdomen on a firm surface, such as a bed or padded floor. 2. Bend your left / right knee and hold  your ankle. If you cannot reach your ankle or pant leg, loop a belt around your foot and grab the belt instead. 3. Gently pull your heel toward your buttocks. Your knee should not slide out to the side. You should feel a stretch in the front of your thigh and knee (quadriceps). 4. Hold this position for __________ seconds. Repeat __________ times. Complete this exercise __________ times a day. Hamstring, supine 1. Lie on your back (supine position). 2. Loop a belt or towel over the ball of your left / right foot. The ball of your foot is on the walking surface, right under your toes. 3. Straighten your left / right knee and slowly pull on the belt to raise your leg until you feel a gentle stretch behind your knee (hamstring). ? Do not let your knee bend while you do this. ? Keep your other leg flat on the floor. 4. Hold this position for __________ seconds. Repeat __________ times. Complete this exercise __________ times a day. Strengthening exercises These exercises build strength and endurance in your knee. Endurance is the ability to use your muscles for a long time, even after they get tired. Quadriceps, isometric This exercise stretches the muscles in front of your thigh (quadriceps) without moving your knee joint (isometric). 1. Lie on your back with your left / right leg extended and your other knee bent. Put a rolled towel or small pillow under your knee if told by your health care provider. 2. Slowly tense the muscles in the front of your left /   right thigh. You should see your kneecap slide up toward your hip or see increased dimpling just above the knee. This motion will push the back of the knee toward the floor. 3. For __________ seconds, hold the muscle as tight as you can without increasing your pain. 4. Relax the muscles slowly and completely. Repeat __________ times. Complete this exercise __________ times a day. Straight leg raises This exercise stretches the muscles in front  of your thigh (quadriceps) and the muscles that move your hips (hip flexors). 1. Lie on your back with your left / right leg extended and your other knee bent. 2. Tense the muscles in the front of your left / right thigh. You should see your kneecap slide up or see increased dimpling just above the knee. Your thigh may even shake a bit. 3. Keep these muscles tight as you raise your leg 4-6 inches (10-15 cm) off the floor. Do not let your knee bend. 4. Hold this position for __________ seconds. 5. Keep these muscles tense as you lower your leg. 6. Relax your muscles slowly and completely after each repetition. Repeat __________ times. Complete this exercise __________ times a day. Hamstring, isometric 1. Lie on your back on a firm surface. 2. Bend your left / right knee about __________ degrees. 3. Dig your left / right heel into the surface as if you are trying to pull it toward your buttocks. Tighten the muscles in the back of your thighs (hamstring) to "dig" as hard as you can without increasing any pain. 4. Hold this position for __________ seconds. 5. Release the tension gradually and allow your muscles to relax completely for __________ seconds after each repetition. Repeat __________ times. Complete this exercise __________ times a day. Hamstring curls If told by your health care provider, do this exercise while wearing ankle weights. Begin with __________ lb weights. Then increase the weight by 1 lb (0.5 kg) increments. Do not wear ankle weights that are more than __________ lb. 1. Lie on your abdomen with your legs straight. 2. Bend your left / right knee as far as you can without feeling pain. Keep your hips flat against the floor. 3. Hold this position for __________ seconds. 4. Slowly lower your leg to the starting position. Repeat __________ times. Complete this exercise __________ times a day. Squats This exercise strengthens the muscles in front of your thigh and knee  (quadriceps). 1. Stand in front of a table, with your feet and knees pointing straight ahead. You may rest your hands on the table for balance but not for support. 2. Slowly bend your knees and lower your hips like you are going to sit in a chair. ? Keep your weight over your heels, not over your toes. ? Keep your lower legs upright so they are parallel with the table legs. ? Do not let your hips go lower than your knees. ? Do not bend lower than told by your health care provider. ? If your knee pain increases, do not bend as low. 3. Hold the squat position for __________ seconds. 4. Slowly push with your legs to return to standing. Do not use your hands to pull yourself to standing. Repeat __________ times. Complete this exercise __________ times a day. Wall slides This exercise strengthens the muscles in front of your thigh and knee (quadriceps). 1. Lean your back against a smooth wall or door, and walk your feet out 18-24 inches (46-61 cm) from it. 2. Place your feet hip-width apart. 3.   Slowly slide down the wall or door until your knees bend __________ degrees. Keep your knees over your heels, not over your toes. Keep your knees in line with your hips. 4. Hold this position for __________ seconds. Repeat __________ times. Complete this exercise __________ times a day. Straight leg raises This exercise strengthens the muscles that rotate the leg at the hip and move it away from your body (hip abductors). 1. Lie on your side with your left / right leg in the top position. Lie so your head, shoulder, knee, and hip line up. You may bend your bottom knee to help you keep your balance. 2. Roll your hips slightly forward so your hips are stacked directly over each other and your left / right knee is facing forward. 3. Leading with your heel, lift your top leg 4-6 inches (10-15 cm). You should feel the muscles in your outer hip lifting. ? Do not let your foot drift forward. ? Do not let your knee  roll toward the ceiling. 4. Hold this position for __________ seconds. 5. Slowly return your leg to the starting position. 6. Let your muscles relax completely after each repetition. Repeat __________ times. Complete this exercise __________ times a day. Straight leg raises This exercise stretches the muscles that move your hips away from the front of the pelvis (hip extensors). 1. Lie on your abdomen on a firm surface. You can put a pillow under your hips if that is more comfortable. 2. Tense the muscles in your buttocks and lift your left / right leg about 4-6 inches (10-15 cm). Keep your knee straight as you lift your leg. 3. Hold this position for __________ seconds. 4. Slowly lower your leg to the starting position. 5. Let your leg relax completely after each repetition. Repeat __________ times. Complete this exercise __________ times a day. This information is not intended to replace advice given to you by your health care provider. Make sure you discuss any questions you have with your health care provider. Document Released: 04/20/2005 Document Revised: 03/27/2018 Document Reviewed: 03/27/2018 [employer] Patient Education  2020 [employer]  

## 2019-04-22 NOTE — Telephone Encounter (Signed)
Please schedule patient for visco injections with [name], PAC.  Schedule Orthovisc series, Lt Knee Buy & Bill Insurance to 100%  No Co pay No PA required

## 2019-05-29 ENCOUNTER — Ambulatory Visit: Payer: Medicare HMO | Admitting: Rheumatology

## 2019-06-05 ENCOUNTER — Ambulatory Visit: Payer: Medicare HMO | Admitting: Physician Assistant

## 2019-06-12 ENCOUNTER — Ambulatory Visit: Payer: Medicare HMO | Admitting: Physician Assistant

## 2019-07-18 NOTE — Progress Notes (Signed)
Office Visit Note  Patient: Scott Chang             Date of Birth: 1940-08-22           MRN: [id number]             PCP: Doree Albee, MD Referring: Doree Albee, MD Visit Date: 07/19/2019 Occupation: @GUAROCC @  Subjective:  Pain in multiple joints   History of Present Illness: Scott Chang is a 79 y.o. male with history of seronegative rheumatoid arthritis, osteoarthritis, and DDD.  Patient was started on Plaquenil in May 2020 but discontinued October 2020 due to not noticing any clinical improvement.  He has not noticed any change since discontinuing Plaquenil.  He continues to have chronic pain and stiffness in both shoulder joints, both hands, both knee joints.  He states that the left knee joint cortisone injection as last visit on 04/19/2019 provided temporary relief.  He would like to proceed with a left knee joint Visco injection.  He has not been taking anything over-the-counter for pain relief.  He has tried taking NSAIDs and Tylenol without any relief.  He tries to remain active despite his joint stiffness lasting all day.   Activities of Daily Living:  Patient reports joint stiffness all day  Patient Reports nocturnal pain.  Difficulty dressing/grooming: Reports Difficulty climbing stairs: Reports Difficulty getting out of chair: Reports Difficulty using hands for taps, buttons, cutlery, and/or writing: Reports  Review of Systems  Constitutional: Positive for fatigue. Negative for night sweats.  HENT: Negative for mouth sores, mouth dryness and nose dryness.   Eyes: Negative for redness, itching and dryness.  Respiratory: Negative for cough, shortness of breath and difficulty breathing.   Cardiovascular: Negative for chest pain, palpitations, hypertension, irregular heartbeat and swelling in legs/feet.  Gastrointestinal: Negative for blood in stool, constipation and diarrhea.  Endocrine: Negative for increased urination.  Genitourinary: Negative for  difficulty urinating and painful urination.  Musculoskeletal: Positive for arthralgias, joint pain, joint swelling and morning stiffness. Negative for myalgias, muscle weakness, muscle tenderness and myalgias.  Skin: Negative for color change, rash, hair loss, nodules/bumps, skin tightness, ulcers and sensitivity to sunlight.  Allergic/Immunologic: Negative for susceptible to infections.  Neurological: Negative for fainting, headaches, night sweats and weakness.  Hematological: Negative for swollen glands.  Psychiatric/Behavioral: Negative for depressed mood, confusion and sleep disturbance. The patient is not nervous/anxious.     PMFS History:  Patient Active Problem List   Diagnosis Date Noted  . Malaise and fatigue 04/18/2019  . Dyspnea on exertion 04/18/2019  . HLD (hyperlipidemia) 04/18/2019  . Primary osteoarthritis of both feet 08/08/2018  . Primary osteoarthritis of right knee 08/08/2018  . History of anxiety 07/27/2018  . History of colonic polyps 07/27/2018  . Vitamin D deficiency 07/27/2018  . Neuropathy 07/27/2018  . DDD (degenerative disc disease), cervical 07/27/2018  . DDD (degenerative disc disease), lumbar 07/27/2018  . Primary osteoarthritis of both hands 07/27/2018    Past Medical History:  Diagnosis Date  . Anxiety   . Arthritis   . Colon polyps   . Dyspnea on exertion 04/18/2019  . HLD (hyperlipidemia) 04/18/2019  . Hypercholesteremia   . Malaise and fatigue 04/18/2019  . Neuropathy     Family History  Problem Relation Age of Onset  . Chronic Renal Failure Mother   . Heart attack Father   . Cancer Brother   . Osteoarthritis Daughter   . Colon cancer Neg Hx    Past Surgical History:  Procedure Laterality Date  . APPENDECTOMY    . CHOLECYSTECTOMY    . COLONOSCOPY N/A 05/09/2013   Procedure: COLONOSCOPY;  Surgeon: Rogene Houston, MD;  Location: [facility];  Service: Endoscopy;  Laterality: N/A;  1030  . COLONOSCOPY W/ BIOPSIES AND POLYPECTOMY      . Left knee arthroscopy    . 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   Immunization History  Administered Date(s) Administered  . Pneumococcal Polysaccharide-23 08/27/2018  . Td 11/16/2018  . Zoster Recombinat (Shingrix) 08/27/2018     Objective: Vital Signs: BP 129/77 (BP Location: Left Arm, Patient Position: Sitting, Cuff Size: Normal)   Pulse 78   Resp 14   Ht 5' 10.5" (1.791 m)   Wt 159 lb 9.6 oz (72.4 kg)   BMI 22.58 kg/m    Physical Exam Vitals and nursing note reviewed.  Constitutional:      Appearance: He is well-developed.  HENT:     Head: Normocephalic and atraumatic.  Eyes:     Conjunctiva/sclera: Conjunctivae normal.     Pupils: Pupils are equal, round, and reactive to light.  Cardiovascular:     Rate and Rhythm: Normal rate and regular rhythm.     Heart sounds: Normal heart sounds.  Pulmonary:     Effort: Pulmonary effort is normal.     Breath sounds: Normal breath sounds.  Abdominal:     General: Bowel sounds are normal.     Palpations: Abdomen is soft.  Musculoskeletal:     Cervical back: Normal range of motion and neck supple.  Skin:    General: Skin is warm and dry.     Capillary Refill: Capillary refill takes less than 2 seconds.  Neurological:     Mental Status: He is alert and oriented to person, place, and time.  Psychiatric:        Behavior: Behavior normal.      Musculoskeletal Exam: C-spine limited lateral rotation bilaterally.  Thoracic and lumbar spine good range of motion.  No midline spinal tenderness.  Shoulder joints full range of motion with some discomfort bilaterally.  Elbow joints, wrist joints, MCPs, PIPs, DIPs good range of motion with no synovitis.  He has synovial thickening of all MCP joints but no inflammation or tenderness noted.  PIP and DIP synovial thickening consistent with osteoarthritis of both hands.  Left hip is  slightly limited range of motion.  Right hip has good range of motion with no discomfort.  Knee joints have good range of motion with no warmth or effusion.  Has bilateral knee crepitus.  Ankle joints have good range of motion with no tenderness or inflammation.  CDAI Exam: CDAI Score: 1.1  Patient Global: 6 mm; Provider Global: 5 mm Swollen: 0 ; Tender: 0  Joint Exam 07/19/2019   No joint exam has been documented for this visit   There is currently no information documented on the homunculus. Go to the Rheumatology activity and complete the homunculus joint exam.  Investigation: No additional findings.  Imaging: No results found.  Recent Labs: Lab Results  Component Value Date   WBC 5.3 02/15/2019   HGB 13.4 02/15/2019   PLT 194 02/15/2019   NA 139 02/15/2019   K 5.0 02/15/2019   CL 107 02/15/2019   CO2 25 02/15/2019   GLUCOSE 102 (H) 02/15/2019   BUN 11 02/15/2019   CREATININE 0.82  02/15/2019   BILITOT 0.6 02/15/2019   AST 21 02/15/2019   ALT 18 02/15/2019   PROT 6.7 02/15/2019   CALCIUM 9.0 02/15/2019   GFRAA 100 02/15/2019   QFTBGOLDPLUS NEGATIVE 07/27/2018    Speciality Comments: PLQ Eye Exam: 11/16/2018 WNL Follow up in 1 year   Procedures:  Large Joint Inj: L knee on 07/19/2019 9:36 AM Indications: pain Details: 25 G 1.5 in needle, medial approach  Arthrogram: No  Medications: 1.5 mL lidocaine 1 %; 30 mg Hyaluronan 30 MG/2ML Aspirate: 0 mL Outcome: tolerated well, no immediate complications Procedure, treatment alternatives, risks and benefits explained, specific risks discussed. Consent was given by the patient. Immediately prior to procedure a time out was called to verify the correct patient, procedure, equipment, support staff and site/side marked as required. Patient was prepped and draped in the usual sterile fashion.     Allergies: Bee venom and Penicillins   Assessment / Plan:     Visit Diagnoses: Rheumatoid arthritis of multiple sites with  negative rheumatoid factor ([place]) - X-rays and ultrasound on 07/27/18 were consistent with inflammatory arthritis: He has no synovitis or tenderness on exam.  He was started on Plaquenil 200 mg 1 tablet twice daily in May 2020 and discontinued in October 2020 due to not noticing any clinical improvement.  He has not noticed any change since discontinuing Plaquenil.  He continues to have chronic pain and stiffness in both shoulder joints, both hands, and both knee joints.  He has discomfort and stiffness with range of motion of both shoulder joints.  His synovial thickening of MCP, PIP, DIP joints but no synovitis was noted.  He has good range of motion of both knee joints with crepitus.  No warmth or effusion of knee joints were noted.  He does not want to take any immunosuppressive agents at this time.  He has tried taking NSAIDs and Tylenol in the past without any relief.  He is also tried Cymbalta but discontinued due to developing lightheadedness.  He was advised to notify us if he develops increased joint pain or joint swelling.  He will follow-up in the office in 5 months.  High risk medication use - D/c Plaquenil 200 mg 1 tablet by mouth twice daily in October 2020 (started May 2020).  Previously discontinued methotrexate due to experiencing shortness of breath.  Discontinued Cymbalta due to lightheadedness.  Primary osteoarthritis of both hands: He has PIP and DIP synovial thickening consistent with osteoarthritis of both hands.  He has pain and stiffness in both hands on a daily basis.  He occasionally has nocturnal pain.  Joint protection and muscle strengthening were discussed.   Primary osteoarthritis of both knees -He has chronic pain and stiffness in both knee joints.  He has good ROM of both knee joints with discomfort.  Bilateral knee crepitus.  No warmth or effusion of knee joints noted.  He had a left knee joint cortisone injection on 04/19/2019 which provided temporary relief.  He presented  today for his first Orthovisc injection in the left knee.  Tolerated procedure well.  The procedure is completed above.  He will schedule his next 2 appointments to receive the full series of orthovisc.   Primary osteoarthritis of both feet: He has no discomfort in his feet at this time.  He was removing shoes.  DDD (degenerative disc disease), cervical: He has limited range of motion with lateral rotation.  He has no symptoms of radiculopathy at this time.  DDD (degenerative disc disease),  lumbar: He experiences stiffness in his lower back especially in the mornings or if he is sedentary throughout the day.  He tries to remain active.  Vitamin D deficiency: He is taking a vitamin D supplement.   Other medical conditions are listed as follows:   Neuropathy  History of anxiety  History of colonic polyps  History of hyperlipidemia  Orders: Orders Placed This Encounter  Procedures  . Large Joint Inj   No orders of the defined types were placed in this encounter.   Follow-Up Instructions: Return in about 5 months (around 12/17/2019) for Rheumatoid arthritis, Osteoarthritis, DDD.   Ofilia Neas, PA-C   I examined and evaluated the patient with [name] PA.  And had no response to  Plaquenil.  He discontinued the medication.  Today he does not have any synovitis on examination.  At this point I do not see any need for adding DMARDs.  Is been having ongoing pain and discomfort in his knee joints.  He will get his first physical supplement injection today.  The plan of care was discussed as noted above.  Bo Merino, MD  Note - This record has been created using Editor, commissioning.  Chart creation errors have been sought, but may not always  have been located. Such creation errors do not reflect on  the standard of medical care.

## 2019-07-19 ENCOUNTER — Ambulatory Visit (INDEPENDENT_AMBULATORY_CARE_PROVIDER_SITE_OTHER): Payer: Medicare HMO | Admitting: Rheumatology

## 2019-07-19 ENCOUNTER — Other Ambulatory Visit: Payer: Self-pay

## 2019-07-19 ENCOUNTER — Encounter: Payer: Self-pay | Admitting: Rheumatology

## 2019-07-19 VITALS — BP 129/77 | HR 78 | Resp 14 | Ht 70.5 in | Wt 159.6 lb

## 2019-07-19 DIAGNOSIS — M19041 Primary osteoarthritis, right hand: Secondary | ICD-10-CM

## 2019-07-19 DIAGNOSIS — G629 Polyneuropathy, unspecified: Secondary | ICD-10-CM | POA: Diagnosis not present

## 2019-07-19 DIAGNOSIS — Z79899 Other long term (current) drug therapy: Secondary | ICD-10-CM

## 2019-07-19 DIAGNOSIS — Z8659 Personal history of other mental and behavioral disorders: Secondary | ICD-10-CM

## 2019-07-19 DIAGNOSIS — Z86010 Personal history of colonic polyps: Secondary | ICD-10-CM

## 2019-07-19 DIAGNOSIS — M5030 Other cervical disc degeneration, unspecified cervical region: Secondary | ICD-10-CM | POA: Diagnosis not present

## 2019-07-19 DIAGNOSIS — M0609 Rheumatoid arthritis without rheumatoid factor, multiple sites: Secondary | ICD-10-CM | POA: Diagnosis not present

## 2019-07-19 DIAGNOSIS — G8929 Other chronic pain: Secondary | ICD-10-CM

## 2019-07-19 DIAGNOSIS — M25562 Pain in left knee: Secondary | ICD-10-CM

## 2019-07-19 DIAGNOSIS — M19071 Primary osteoarthritis, right ankle and foot: Secondary | ICD-10-CM

## 2019-07-19 DIAGNOSIS — Z8639 Personal history of other endocrine, nutritional and metabolic disease: Secondary | ICD-10-CM

## 2019-07-19 DIAGNOSIS — E559 Vitamin D deficiency, unspecified: Secondary | ICD-10-CM

## 2019-07-19 DIAGNOSIS — M170 Bilateral primary osteoarthritis of knee: Secondary | ICD-10-CM

## 2019-07-19 DIAGNOSIS — M5136 Other intervertebral disc degeneration, lumbar region: Secondary | ICD-10-CM

## 2019-07-19 DIAGNOSIS — M19072 Primary osteoarthritis, left ankle and foot: Secondary | ICD-10-CM

## 2019-07-19 DIAGNOSIS — M19042 Primary osteoarthritis, left hand: Secondary | ICD-10-CM

## 2019-07-22 NOTE — Telephone Encounter (Signed)
Patient's insurance has not changed. Benefits should be the same. No PA required.

## 2019-07-24 ENCOUNTER — Encounter (INDEPENDENT_AMBULATORY_CARE_PROVIDER_SITE_OTHER): Payer: Medicare HMO | Admitting: Internal Medicine

## 2019-07-26 ENCOUNTER — Other Ambulatory Visit: Payer: Self-pay

## 2019-07-26 ENCOUNTER — Ambulatory Visit (INDEPENDENT_AMBULATORY_CARE_PROVIDER_SITE_OTHER): Payer: Medicare HMO | Admitting: Rheumatology

## 2019-07-26 DIAGNOSIS — M170 Bilateral primary osteoarthritis of knee: Secondary | ICD-10-CM | POA: Diagnosis not present

## 2019-07-26 MED ORDER — HYALURONAN 30 MG/2ML IX SOSY
30.0000 mg | PREFILLED_SYRINGE | INTRA_ARTICULAR | Status: AC | PRN
  Administered 2019-07-26: 30 mg via INTRA_ARTICULAR

## 2019-07-26 MED ORDER — LIDOCAINE HCL 1 % IJ SOLN
1.0000 mL | INTRAMUSCULAR | Status: AC | PRN
  Administered 2019-07-26: 1 mL

## 2019-07-26 NOTE — Progress Notes (Signed)
   Procedure Note  Patient: Scott Chang             Date of Birth: 05/15/1941           MRN: [id number]             Visit Date: 07/26/2019  Procedures: Visit Diagnoses: No diagnosis found.  Large Joint Inj: bilateral knee on 07/26/2019 8:11 AM Indications: pain Details: 27 G 1.5 in needle, medial approach  Arthrogram: No  Medications (Right): 1 mL lidocaine 1 %; 30 mg Hyaluronan 30 MG/2ML Aspirate (Right): 0 mL Medications (Left): 1 mL lidocaine 1 %; 30 mg Hyaluronan 30 MG/2ML Aspirate (Left): 0 mL Outcome: tolerated well, no immediate complications Procedure, treatment alternatives, risks and benefits explained, specific risks discussed. Consent was given by the patient. Immediately prior to procedure a time out was called to verify the correct patient, procedure, equipment, support staff and site/side marked as required. Patient was prepped and draped in the usual sterile fashion.     Bo Merino, MD

## 2019-08-01 ENCOUNTER — Other Ambulatory Visit: Payer: Self-pay

## 2019-08-01 ENCOUNTER — Ambulatory Visit (INDEPENDENT_AMBULATORY_CARE_PROVIDER_SITE_OTHER): Payer: Medicare HMO | Admitting: Rheumatology

## 2019-08-01 DIAGNOSIS — M170 Bilateral primary osteoarthritis of knee: Secondary | ICD-10-CM

## 2019-08-01 DIAGNOSIS — M1712 Unilateral primary osteoarthritis, left knee: Secondary | ICD-10-CM

## 2019-08-01 MED ORDER — LIDOCAINE HCL 1 % IJ SOLN
1.5000 mL | INTRAMUSCULAR | Status: AC | PRN
  Administered 2019-08-01: 1.5 mL

## 2019-08-01 MED ORDER — HYALURONAN 30 MG/2ML IX SOSY
30.0000 mg | PREFILLED_SYRINGE | INTRA_ARTICULAR | Status: AC | PRN
  Administered 2019-08-01: 30 mg via INTRA_ARTICULAR

## 2019-08-01 NOTE — Progress Notes (Signed)
   Procedure Note  Patient: Scott Chang             Date of Birth: 08-Jan-1941           MRN: [id number]             Visit Date: 08/01/2019  Procedures: Visit Diagnoses:  1. Primary osteoarthritis of both knees     Orthovisc #3 left knee B/B Large Joint Inj: L knee on 08/01/2019 2:40 PM Indications: pain Details: 25 G 1.5 in needle, medial approach  Arthrogram: No  Medications: 1.5 mL lidocaine 1 %; 30 mg Hyaluronan 30 MG/2ML Aspirate: 0 mL Outcome: tolerated well, no immediate complications Procedure, treatment alternatives, risks and benefits explained, specific risks discussed. Consent was given by the patient. Immediately prior to procedure a time out was called to verify the correct patient, procedure, equipment, support staff and site/side marked as required. Patient was prepped and draped in the usual sterile fashion.     Patient tolerated the procedure well.  Bo Merino, MD

## 2019-08-02 ENCOUNTER — Ambulatory Visit: Payer: Medicare HMO | Admitting: Rheumatology

## 2019-10-09 ENCOUNTER — Other Ambulatory Visit: Payer: Self-pay

## 2019-10-09 ENCOUNTER — Ambulatory Visit (INDEPENDENT_AMBULATORY_CARE_PROVIDER_SITE_OTHER): Payer: Medicare HMO | Admitting: Internal Medicine

## 2019-10-09 ENCOUNTER — Encounter (INDEPENDENT_AMBULATORY_CARE_PROVIDER_SITE_OTHER): Payer: Self-pay | Admitting: Internal Medicine

## 2019-10-09 VITALS — BP 120/70 | HR 75 | Temp 98.1°F | Ht 70.0 in | Wt 161.4 lb

## 2019-10-09 DIAGNOSIS — R5381 Other malaise: Secondary | ICD-10-CM | POA: Diagnosis not present

## 2019-10-09 DIAGNOSIS — Z125 Encounter for screening for malignant neoplasm of prostate: Secondary | ICD-10-CM | POA: Diagnosis not present

## 2019-10-09 DIAGNOSIS — M792 Neuralgia and neuritis, unspecified: Secondary | ICD-10-CM

## 2019-10-09 DIAGNOSIS — R5383 Other fatigue: Secondary | ICD-10-CM

## 2019-10-09 DIAGNOSIS — Z0001 Encounter for general adult medical examination with abnormal findings: Secondary | ICD-10-CM

## 2019-10-09 LAB — PSA: PSA: 0.2 ng/mL (ref <=4.0)

## 2019-10-09 MED ORDER — TESTOSTERONE CYPIONATE 200 MG/ML IM SOLN: 100.0000 mg | INTRAMUSCULAR | 0 refills | Status: DC

## 2019-10-09 MED ORDER — GABAPENTIN 100 MG PO CAPS: 100.0000 mg | ORAL_CAPSULE | Freq: Three times a day (TID) | ORAL | 3 refills | Status: DC

## 2019-10-09 NOTE — Progress Notes (Signed)
Chief Complaint: This 79 year old man comes in for an annual physical exam and to address his symptoms which he is presently frustrated with. HPI: He has extreme fatigue.  This is not improved. He also has joint pains mostly in the left knee where he has quite significant osteoarthritis.  He also has seronegative rheumatoid arthritis and treatment by the rheumatologist with Plaquenil did not help.  He has stopped all these medications now and feels awful. In the past, I had mentioned testosterone therapy as his levels was not optimal.  He now really wants to try this. He is also complaining of what appears to be neuropathic pain affecting his left upper arm and upper back area.  Past Medical History:  Diagnosis Date  . Anxiety   . Arthritis   . Colon polyps   . Dyspnea on exertion 04/18/2019  . HLD (hyperlipidemia) 04/18/2019  . Hypercholesteremia   . Malaise and fatigue 04/18/2019  . Neuropathy    Past Surgical History:  Procedure Laterality Date  . APPENDECTOMY    . CHOLECYSTECTOMY    . COLONOSCOPY N/A 05/09/2013   Procedure: COLONOSCOPY;  Surgeon: Rogene Houston, MD;  Location: [facility];  Service: Endoscopy;  Laterality: N/A;  1030  . COLONOSCOPY W/ BIOPSIES AND POLYPECTOMY    . Left knee arthroscopy    . 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    Social History   Tobacco Use  . Smoking status: Former Smoker    Packs/day: 1.00    Years: 40.00    Pack years: 40.00    Types: Cigarettes    Quit date: 06/21/1988    Years since quitting: 31.3  . Smokeless tobacco: Never Used  Substance Use Topics  . Alcohol use: Not Currently      Allergies:  Allergies  Allergen Reactions  . Bee Venom Anaphylaxis  . Penicillins Itching and Rash     Current Meds  Medication Sig  . Cholecalciferol (VITAMIN D) 125 MCG (5000 UT) CAPS Take 5,000 Units by mouth 2 (two)  times daily.   . folic acid (FOLVITE) 1 MG tablet Take 2 tablets (2 mg total) by mouth daily.  . Multiple Vitamin (MULTIVITAMIN WITH MINERALS) TABS tablet Take 1 tablet by mouth daily.  Vladimir Faster Glycol-Propyl Glycol (SYSTANE OP) Place 1 drop into both eyes daily as needed (Dry Eyes).       [id number] from the symptoms mentioned above,there are no other symptoms referable to all systems reviewed.  Physical Exam: Blood pressure 120/70, pulse 75, temperature 98.1 F (36.7 C), temperature source Temporal, height 5\' 10"  (1.778 m), weight 161 lb 6.4 oz (73.2 kg), SpO2 97 %. Vitals with BMI 10/09/2019 07/19/2019 04/19/2019  Height 5\' 10"  5' 10.5" 5' 10.5"  Weight 161 lbs 6 oz 159 lbs 10 oz 153 lbs 3 oz  BMI 23.16 [id number]  Systolic 123456 [id number]  Diastolic 70 77 66  Pulse 75 78 75      He looks systemically well.  Blood pressure under  excellent control. General: Alert, cooperative, and appears to be the stated age.No pallor.  No jaundice.  No clubbing. Head: Normocephalic Eyes: Sclera white, pupils equal and reactive to light, red reflex x 2,  Ears: Normal bilaterally Oral cavity: Lips, mucosa, and tongue normal: Teeth and gums normal Neck: No adenopathy, supple, symmetrical, trachea midline, and thyroid does not  appear enlarged Respiratory: Clear to auscultation bilaterally.No wheezing, crackles or bronchial breathing. Cardiovascular: Heart sounds are present and appear to be normal without murmurs or added sounds.  No carotid bruits.  Peripheral pulses are present and equal bilaterally.: Gastrointestinal:positive bowel sounds, no hepatosplenomegaly.  No masses felt.No tenderness. Skin: Clear, No rashes noted.No worrisome skin lesions seen. Neurological: Grossly intact without focal findings, cranial nerves II through XII intact, muscle strength equal bilaterally Musculoskeletal: No acute joint abnormalities noted.Full range of movement noted with joints. Psychiatric: Affect  appropriate, non-anxious.    Assessment  1. Special screening for malignant neoplasm of prostate   2. Malaise and fatigue   3. Neuropathic pain     Tests Ordered:   Orders Placed This Encounter  Procedures  . PSA     Plan  1. I will check a PSA before starting testosterone therapy.  I discussed again FDA warnings, pros and cons, benefits and side effects and he wishes to proceed with injections.  I have sent an injection for testosterone 100 mg intramuscularly once a week.  He will come back for education on administration. 2. In terms of his neuropathic pain we will start and try gabapentin to see if this will help him. 3. Follow-up in 6 weeks. 4. Today in addition to a preventative visit, I performed an office visit to address his symptoms above and discuss testosterone therapy.     Meds ordered this encounter  Medications  . testosterone cypionate (DEPO-TESTOSTERONE) 200 MG/ML injection    Sig: Inject 0.5 mLs (100 mg total) into the muscle every 7 (seven) days.    Dispense:  10 mL    Refill:  0  . gabapentin (NEURONTIN) 100 MG capsule    Sig: Take 1 capsule (100 mg total) by mouth 3 (three) times daily.    Dispense:  90 capsule    Refill:  3     Shaquana Buel C Elior Robinette   10/09/2019, 9:25 AM

## 2019-10-16 ENCOUNTER — Other Ambulatory Visit: Payer: Self-pay

## 2019-10-16 ENCOUNTER — Ambulatory Visit (INDEPENDENT_AMBULATORY_CARE_PROVIDER_SITE_OTHER): Payer: Medicare HMO

## 2019-10-16 ENCOUNTER — Encounter (INDEPENDENT_AMBULATORY_CARE_PROVIDER_SITE_OTHER): Payer: Self-pay

## 2019-10-16 VITALS — Temp 97.0°F | Resp 18 | Ht 70.0 in | Wt 161.0 lb

## 2019-10-16 DIAGNOSIS — R5381 Other malaise: Secondary | ICD-10-CM

## 2019-10-16 DIAGNOSIS — R5383 Other fatigue: Secondary | ICD-10-CM

## 2019-10-16 DIAGNOSIS — R6882 Decreased libido: Secondary | ICD-10-CM

## 2019-10-16 MED ORDER — TESTOSTERONE CYPIONATE 200 MG/ML IM SOLN: 100.0000 mg | Freq: Once | INTRAMUSCULAR | Status: DC

## 2019-10-16 NOTE — Progress Notes (Signed)
Pt came today to give the 1st Testosterone . Given 0.81mL  injection to the outer side Rt thigh muscle. Pt tolerated well. New script was given to the pt to pick up needles to self inject at home.

## 2019-11-20 ENCOUNTER — Encounter (INDEPENDENT_AMBULATORY_CARE_PROVIDER_SITE_OTHER): Payer: Self-pay | Admitting: Internal Medicine

## 2019-11-20 ENCOUNTER — Other Ambulatory Visit: Payer: Self-pay

## 2019-11-20 ENCOUNTER — Ambulatory Visit (INDEPENDENT_AMBULATORY_CARE_PROVIDER_SITE_OTHER): Payer: Medicare HMO | Admitting: Internal Medicine

## 2019-11-20 VITALS — BP 130/70 | HR 71 | Temp 98.4°F | Ht 70.0 in | Wt 169.2 lb

## 2019-11-20 DIAGNOSIS — M792 Neuralgia and neuritis, unspecified: Secondary | ICD-10-CM

## 2019-11-20 DIAGNOSIS — G4700 Insomnia, unspecified: Secondary | ICD-10-CM

## 2019-11-20 DIAGNOSIS — R5383 Other fatigue: Secondary | ICD-10-CM

## 2019-11-20 DIAGNOSIS — R6882 Decreased libido: Secondary | ICD-10-CM

## 2019-11-20 DIAGNOSIS — R5381 Other malaise: Secondary | ICD-10-CM | POA: Diagnosis not present

## 2019-11-20 NOTE — Progress Notes (Signed)
Metrics: Intervention Frequency ACO  Documented Smoking Status Yearly  Screened one or more times in 24 months  Cessation Counseling or  Active cessation medication Past 24 months  Past 24 months   Guideline developer: UpToDate (See UpToDate for funding source) Date Released: 2014       Wellness Office Visit  Subjective:  Patient ID: Scott Chang, male    DOB: 21-May-1941  Age: 79 y.o. 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 HPI  He says that since starting testosterone therapy, he has noticed some improvement in energy but he still does not feel well. As far as his neuropathic pain is concerned, he I think misunderstood and has been taking gabapentin only once a day.  The prescription for gabapentin was for 100 mg 3 times a day. He continues to take vitamin D3 10,000 units daily and also is taking to turmeric. He continues to try and exercise to maintain function. He tells me he has great difficulty with sleep and he barely gets 2 to 4 hours of sleep every night.  Past Medical History:  Diagnosis Date  . Anxiety   . Arthritis   . Colon polyps   . Dyspnea on exertion 04/18/2019  . HLD (hyperlipidemia) 04/18/2019  . Hypercholesteremia   . Malaise and fatigue 04/18/2019  . Neuropathy    Past Surgical History:  Procedure Laterality Date  . APPENDECTOMY    . CHOLECYSTECTOMY    . COLONOSCOPY N/A 05/09/2013   Procedure: COLONOSCOPY;  Surgeon: Rogene Houston, MD;  Location: [facility];  Service: Endoscopy;  Laterality: N/A;  1030  . COLONOSCOPY W/ BIOPSIES AND POLYPECTOMY    . Left knee arthroscopy    . Left shoulder arthroscopy     X 2     Family History  Problem Relation Age of Onset  . Chronic Renal Failure Mother   . Heart attack Father   . Cancer Brother   . Osteoarthritis Daughter   . 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   Social History   Tobacco Use  . Smoking status: Former Smoker    Packs/day: 1.00    Years: 40.00    Pack years: 40.00    Types: Cigarettes    Quit date: 06/21/1988    Years since quitting: 31.4  . Smokeless tobacco: Never Used  Substance Use Topics  . Alcohol use: Not Currently    Current Meds  Medication Sig  . Cholecalciferol (VITAMIN D) 125 MCG (5000 UT) CAPS Take 5,000 Units by mouth 2 (two) times daily.   Marland Kitchen gabapentin (NEURONTIN) 100 MG capsule Take 1 capsule (100 mg total) by mouth 3 (three) times daily.  . Multiple Vitamin (MULTIVITAMIN WITH MINERALS) TABS tablet Take 1 tablet by mouth daily.  Vladimir Faster Glycol-Propyl Glycol (SYSTANE OP) Place 1 drop into both eyes daily as needed (Dry Eyes).  Marland Kitchen testosterone cypionate (DEPO-TESTOSTERONE) 200 MG/ML injection Inject 0.5 mLs (100 mg total) into the muscle every 7 (seven) days.  . Turmeric (QC TUMERIC COMPLEX) 500 MG CAPS Take 2 capsules by mouth daily.   Current Facility-Administered Medications for the 11/20/19 encounter (Office Visit) with Doree Albee, MD  Medication  . testosterone cypionate (DEPOTESTOSTERONE CYPIONATE) injection 100 mg       Depression screen [facility] 2/9 10/09/2019  Decreased Interest 0  Down,  Depressed, Hopeless 0  PHQ - 2 Score 0     Objective:   Today's Vitals: BP 130/70 (BP Location: Left Arm, Patient Position: Sitting, Cuff Size: Normal)   Pulse 71   Temp 98.4 F (36.9 C) (Temporal)   Ht 5\' 10"  (1.778 m)   Wt 169 lb 3.2 oz (76.7 kg)   SpO2 99%   BMI 24.28 kg/m  Vitals with BMI 11/20/2019 10/16/2019 10/09/2019  Height 5\' 10"  5\' 10"  5\' 10"   Weight 169 lbs 3 oz 161 lbs 161 lbs 6 oz  BMI 24.28 Q000111Q 123456  Systolic [id number] - 123456  Diastolic 70 - 70  Pulse 71 - 75     Physical Exam  He looks systemically well.  He has gained 8 pounds since her last visit.  Blood pressure is excellent.     Assessment   1. Malaise and fatigue   2.  Decreased libido without sexual dysfunction   3. Neuropathic pain   4. Insomnia, unspecified type       Tests ordered No orders of the defined types were placed in this encounter.    Plan: 1. I have told him now to increase testosterone so he will be taking 100 mg intramuscular twice a week. 2. He will start taking gabapentin 100 mg 3 times a day as he is tolerating this without getting drowsy. 3. As far as his insomnia is concerned, I recommended melatonin from [email or web address] and he was started 1 mg capsule every night and then progress to achieve the dose that is effective. 4. I will see him in about a month's time to see how he is doing and we will check all levels then.   No orders of the defined types were placed in this encounter.   Doree Albee, MD

## 2019-12-02 ENCOUNTER — Ambulatory Visit (INDEPENDENT_AMBULATORY_CARE_PROVIDER_SITE_OTHER): Payer: Medicare HMO | Admitting: Internal Medicine

## 2019-12-02 ENCOUNTER — Other Ambulatory Visit: Payer: Self-pay

## 2019-12-02 ENCOUNTER — Encounter (INDEPENDENT_AMBULATORY_CARE_PROVIDER_SITE_OTHER): Payer: Self-pay | Admitting: Internal Medicine

## 2019-12-02 VITALS — BP 138/80 | HR 78 | Temp 97.5°F | Resp 18 | Ht 70.0 in | Wt 170.0 lb

## 2019-12-02 DIAGNOSIS — R2243 Localized swelling, mass and lump, lower limb, bilateral: Secondary | ICD-10-CM

## 2019-12-02 NOTE — Progress Notes (Signed)
Metrics: Intervention Frequency ACO  Documented Smoking Status Yearly  Screened one or more times in 24 months  Cessation Counseling or  Active cessation medication Past 24 months  Past 24 months   Guideline developer: UpToDate (See UpToDate for funding source) Date Released: 2014       Wellness Office Visit  Subjective:  Patient ID: Scott Chang, male    DOB: 09-May-1941  Age: 79 y.o. MRN: [id number]  CC: Bilateral swollen lower legs. HPI  This man noticed bilateral lower legs swelling 5 days ago.  He had taken his second dose of testosterone on that day.  He also had been to the beach prior to that.  He denies any dyspnea, chest pain or pain in his legs. I had requested of him to hold the testosterone second dose until I saw him today. Past Medical History:  Diagnosis Date  . Anxiety   . Arthritis   . Colon polyps   . Dyspnea on exertion 04/18/2019  . HLD (hyperlipidemia) 04/18/2019  . Hypercholesteremia   . Malaise and fatigue 04/18/2019  . Neuropathy    Past Surgical History:  Procedure Laterality Date  . APPENDECTOMY    . CHOLECYSTECTOMY    . COLONOSCOPY N/A 05/09/2013   Procedure: COLONOSCOPY;  Surgeon: Rogene Houston, MD;  [facility];  Service: Endoscopy;  Laterality: N/A;  1030  . COLONOSCOPY W/ BIOPSIES AND POLYPECTOMY    . Left knee arthroscopy    . Left shoulder arthroscopy     X 2     Family History  Problem Relation Age of Onset  . Chronic Renal Failure Mother   . Heart attack Father   . Cancer Brother   . Osteoarthritis Daughter   . 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   Social History   Tobacco Use  . Smoking status: Former Smoker    Packs/day: 1.00    Years: 40.00    Pack years: 40.00    Types: Cigarettes    Quit date: 06/21/1988    Years since quitting: 31.4  . Smokeless tobacco: Never Used  Substance Use Topics  . Alcohol use:  Not Currently    Current Meds  Medication Sig  . Cholecalciferol (VITAMIN D) 125 MCG (5000 UT) CAPS Take 5,000 Units by mouth 2 (two) times daily.   Marland Kitchen gabapentin (NEURONTIN) 100 MG capsule Take 1 capsule (100 mg total) by mouth 3 (three) times daily.  . Multiple Vitamin (MULTIVITAMIN WITH MINERALS) TABS tablet Take 1 tablet by mouth daily.  Vladimir Faster Glycol-Propyl Glycol (SYSTANE OP) Place 1 drop into both eyes daily as needed (Dry Eyes).  Marland Kitchen testosterone cypionate (DEPO-TESTOSTERONE) 200 MG/ML injection Inject 0.5 mLs (100 mg total) into the muscle every 7 (seven) days.  . Turmeric (QC TUMERIC COMPLEX) 500 MG CAPS Take 2 capsules by mouth daily.   Current Facility-Administered Medications for the 12/02/19 encounter (Office Visit) with Doree Albee, MD  Medication  . testosterone cypionate (DEPOTESTOSTERONE CYPIONATE) injection 100 mg      Depression screen [facility] 2/9 10/09/2019  Decreased Interest 0  Down, Depressed, Hopeless 0  PHQ - 2 Score 0     Objective:   Today's Vitals: BP 138/80 (BP Location: Right Arm, Patient Position: Sitting, Cuff Size: Normal)   Pulse 78   Temp (!) 97.5 F (36.4 C) (Temporal)   Resp 18   Ht 5\' 10"  (1.778  m)   Wt 170 lb (77.1 kg)   SpO2 98% Comment: wearing mask  BMI 24.39 kg/m  Vitals with BMI 12/02/2019 11/20/2019 10/16/2019  Height 5\' 10"  5\' 10"  5\' 10"   Weight 170 lbs 169 lbs 3 oz 161 lbs  BMI 24.39 60.04 59.9  Systolic 774 142 -  Diastolic 80 70 -  Pulse 78 71 -     Physical Exam   He looks systemically well.  His weight is unchanged largely.  Blood pressure is acceptable.  Lung fields are entirely clear.  There is swelling in both his lower legs but there is no pitting edema.  There is no clinical evidence of DVT bilaterally, although that this in itself would be very unusual to have bilateral DVT.  He is alert and orientated.    Assessment   1. Localized swelling of both lower legs       Tests ordered No orders of the  defined types were placed in this encounter.    Plan: 1. This may be related to heat or it may be related to testosterone therapy.  There is no clinical evidence of DVT.  There is no clinical evidence of heart failure.  I recommended to the patient that he should continue with taking testosterone therapy twice a week and he will take injection today.  If the swelling gets worse or does not improve, we will then reevaluate.  I am due to see him in a couple of weeks and we can reevaluate at that time.   No orders of the defined types were placed in this encounter.   Doree Albee, MD

## 2019-12-18 ENCOUNTER — Other Ambulatory Visit: Payer: Self-pay

## 2019-12-18 ENCOUNTER — Encounter (INDEPENDENT_AMBULATORY_CARE_PROVIDER_SITE_OTHER): Payer: Self-pay | Admitting: Internal Medicine

## 2019-12-18 ENCOUNTER — Ambulatory Visit (INDEPENDENT_AMBULATORY_CARE_PROVIDER_SITE_OTHER): Payer: Medicare HMO | Admitting: Internal Medicine

## 2019-12-18 VITALS — BP 135/70 | HR 97 | Temp 97.7°F | Ht 70.0 in | Wt 169.8 lb

## 2019-12-18 DIAGNOSIS — R5383 Other fatigue: Secondary | ICD-10-CM | POA: Diagnosis not present

## 2019-12-18 DIAGNOSIS — R2243 Localized swelling, mass and lump, lower limb, bilateral: Secondary | ICD-10-CM

## 2019-12-18 DIAGNOSIS — R6882 Decreased libido: Secondary | ICD-10-CM | POA: Diagnosis not present

## 2019-12-18 DIAGNOSIS — R5381 Other malaise: Secondary | ICD-10-CM | POA: Diagnosis not present

## 2019-12-18 MED ORDER — TESTOSTERONE CYPIONATE 200 MG/ML IM SOLN: 120.0000 mg | INTRAMUSCULAR | 1 refills | Status: DC

## 2019-12-18 NOTE — Progress Notes (Signed)
Metrics: Intervention Frequency ACO  Documented Smoking Status Yearly  Screened one or more times in 24 months  Cessation Counseling or  Active cessation medication Past 24 months  Past 24 months   Guideline developer: UpToDate (See UpToDate for funding source) Date Released: 2014       Wellness Office Visit  Subjective:  Patient ID: Scott Chang, male    DOB: 1941-01-22  Age: 79 y.o. MRN: [id number]  CC: This man comes in for follow-up of leg swelling and testosterone therapy. HPI  I had seen him a couple of weeks ago for leg swelling and this is completely resolved now.  I suspect this was related to sunburn and inflammation related to that. As far as testosterone therapy is concerned, he does not feel as well as he could.  He is taking testosterone therapy twice a week.  He has had no major side effects from testosterone. Past Medical History:  Diagnosis Date  . Anxiety   . Arthritis   . Colon polyps   . Dyspnea on exertion 04/18/2019  . HLD (hyperlipidemia) 04/18/2019  . Hypercholesteremia   . Malaise and fatigue 04/18/2019  . Neuropathy    Past Surgical History:  Procedure Laterality Date  . APPENDECTOMY    . CHOLECYSTECTOMY    . COLONOSCOPY N/A 05/09/2013   Procedure: COLONOSCOPY;  Surgeon: Rogene Houston, MD;  [facility];  Service: Endoscopy;  Laterality: N/A;  1030  . COLONOSCOPY W/ BIOPSIES AND POLYPECTOMY    . Left knee arthroscopy    . Left shoulder arthroscopy     X 2     Family History  Problem Relation Age of Onset  . Chronic Renal Failure Mother   . Heart attack Father   . Cancer Brother   . Osteoarthritis Daughter   . 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   Social History   Tobacco Use  . Smoking status: Former Smoker    Packs/day: 1.00    Years: 40.00    Pack years: 40.00    Types: Cigarettes    Quit date: 06/21/1988    Years since  quitting: 31.5  . Smokeless tobacco: Never Used  Substance Use Topics  . Alcohol use: Not Currently    Current Meds  Medication Sig  . Cholecalciferol (VITAMIN D) 125 MCG (5000 UT) CAPS Take 5,000 Units by mouth 2 (two) times daily.   Marland Kitchen gabapentin (NEURONTIN) 100 MG capsule Take 1 capsule (100 mg total) by mouth 3 (three) times daily.  . Multiple Vitamin (MULTIVITAMIN WITH MINERALS) TABS tablet Take 1 tablet by mouth daily.  Vladimir Faster Glycol-Propyl Glycol (SYSTANE OP) Place 1 drop into both eyes daily as needed (Dry Eyes).  Derrill Memo ON 12/19/2019] testosterone cypionate (DEPO-TESTOSTERONE) 200 MG/ML injection Inject 0.6 mLs (120 mg total) into the muscle 2 (two) times a week.  . Turmeric (QC TUMERIC COMPLEX) 500 MG CAPS Take 2 capsules by mouth daily.  . [DISCONTINUED] testosterone cypionate (DEPO-TESTOSTERONE) 200 MG/ML injection Inject 0.5 mLs (100 mg total) into the muscle every 7 (seven) days. (Patient taking differently: Inject 100 mg into the muscle 2 (two) times a week. )   Current Facility-Administered Medications for the 12/18/19 encounter (Office Visit) with Doree Albee, MD  Medication  . testosterone cypionate (DEPOTESTOSTERONE CYPIONATE) injection 100 mg      Depression screen [facility] 2/9 10/09/2019  Decreased Interest 0  Down, Depressed, Hopeless 0  PHQ - 2 Score 0     Objective:   Today's Vitals: BP 135/70 (BP Location: Left Arm, Patient Position: Sitting, Cuff Size: Normal)   Pulse 97   Temp 97.7 F (36.5 C) (Temporal)   Ht 5\' 10"  (1.778 m)   Wt 169 lb 12.8 oz (77 kg)   SpO2 97%   BMI 24.36 kg/m  Vitals with BMI 12/18/2019 12/02/2019 11/20/2019  Height 5\' 10"  5\' 10"  5\' 10"   Weight 169 lbs 13 oz 170 lbs 169 lbs 3 oz  BMI 24.36 70.78 67.54  Systolic 492 010 071  Diastolic 70 80 70  Pulse 97 78 71     Physical Exam   He looks systemically well.  Blood pressure is in a good range.  Weight is stable.    Assessment   1. Localized swelling of both lower  legs   2. Malaise and fatigue   3. Decreased libido without sexual dysfunction       Tests ordered No orders of the defined types were placed in this encounter.    Plan: 1. I am going to increase his testosterone dose so he will be taking 120 mg intramuscular twice a week.  I have sent a new prescription to reflect this. 2. I will see him in 6 weeks time for close follow-up and we will check levels at that time.  If his testosterone levels are in excellent range and he does not feel better, I may use DHEA also.   Meds ordered this encounter  Medications  . testosterone cypionate (DEPO-TESTOSTERONE) 200 MG/ML injection    Sig: Inject 0.6 mLs (120 mg total) into the muscle 2 (two) times a week.    Dispense:  10 mL    Refill:  1    Isaack Preble Luther Parody, MD

## 2019-12-19 ENCOUNTER — Ambulatory Visit: Payer: Medicare HMO | Admitting: Rheumatology

## 2020-01-16 ENCOUNTER — Other Ambulatory Visit (INDEPENDENT_AMBULATORY_CARE_PROVIDER_SITE_OTHER): Payer: Self-pay | Admitting: Internal Medicine

## 2020-01-30 ENCOUNTER — Encounter (INDEPENDENT_AMBULATORY_CARE_PROVIDER_SITE_OTHER): Payer: Self-pay | Admitting: Internal Medicine

## 2020-01-30 ENCOUNTER — Other Ambulatory Visit: Payer: Self-pay

## 2020-01-30 ENCOUNTER — Ambulatory Visit (INDEPENDENT_AMBULATORY_CARE_PROVIDER_SITE_OTHER): Payer: Medicare HMO | Admitting: Internal Medicine

## 2020-01-30 VITALS — BP 130/80 | HR 88 | Temp 98.0°F | Resp 18 | Ht 72.0 in | Wt 172.2 lb

## 2020-01-30 DIAGNOSIS — R5381 Other malaise: Secondary | ICD-10-CM | POA: Diagnosis not present

## 2020-01-30 DIAGNOSIS — R5383 Other fatigue: Secondary | ICD-10-CM | POA: Diagnosis not present

## 2020-01-30 DIAGNOSIS — R6889 Other general symptoms and signs: Secondary | ICD-10-CM

## 2020-01-30 DIAGNOSIS — R6882 Decreased libido: Secondary | ICD-10-CM

## 2020-01-30 DIAGNOSIS — M1990 Unspecified osteoarthritis, unspecified site: Secondary | ICD-10-CM

## 2020-01-30 NOTE — Progress Notes (Signed)
Metrics: Intervention Frequency ACO  Documented Smoking Status Yearly  Screened one or more times in 24 months  Cessation Counseling or  Active cessation medication Past 24 months  Past 24 months   Guideline developer: UpToDate (See UpToDate for funding source) Date Released: 2014       Wellness Office Visit  Subjective:  Patient ID: Scott Chang, male    DOB: 27-Oct-1940  Age: 79 y.o. MRN: [id number]  CC: This man comes in for follow-up regarding his symptoms of malaise, poor endurance, arthritic pains. HPI  On the last visit, I increased his testosterone dose 120 mg twice a week and he does not feel appreciably different.  However he does tell me he is able to do things and constantly pushes himself which is a very good thing. He also today describe cold intolerance. He continues to have arthritic pains.  He takes turmeric and vitamin D3. Past Medical History:  Diagnosis Date  . Anxiety   . Arthritis   . Colon polyps   . Dyspnea on exertion 04/18/2019  . HLD (hyperlipidemia) 04/18/2019  . Hypercholesteremia   . Malaise and fatigue 04/18/2019  . Neuropathy    Past Surgical History:  Procedure Laterality Date  . APPENDECTOMY    . CHOLECYSTECTOMY    . COLONOSCOPY N/A 05/09/2013   Procedure: COLONOSCOPY;  Surgeon: Rogene Houston, MD;  [facility]: [facility];  Service: Endoscopy;  Laterality: N/A;  1030  . COLONOSCOPY W/ BIOPSIES AND POLYPECTOMY    . Left knee arthroscopy    . Left shoulder arthroscopy     X 2     Family History  Problem Relation Age of Onset  . Chronic Renal Failure Mother   . Heart attack Father   . Cancer Brother   . Osteoarthritis Daughter   . 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   Social History   Tobacco Use  . Smoking status: Former Smoker    Packs/day: 1.00    Years: 40.00    Pack years: 40.00    Types: Cigarettes    Quit date: 06/21/1988     Years since quitting: 31.6  . Smokeless tobacco: Never Used  Substance Use Topics  . Alcohol use: Not Currently    Current Meds  Medication Sig  . Cholecalciferol (VITAMIN D) 125 MCG (5000 UT) CAPS Take 5,000 Units by mouth 2 (two) times daily.   Marland Kitchen gabapentin (NEURONTIN) 100 MG capsule TAKE 1 CAPSULE BY MOUTH THREE TIMES A DAY.  . Multiple Vitamin (MULTIVITAMIN WITH MINERALS) TABS tablet Take 1 tablet by mouth daily.  Vladimir Faster Glycol-Propyl Glycol (SYSTANE OP) Place 1 drop into both eyes daily as needed (Dry Eyes).  Marland Kitchen testosterone cypionate (DEPO-TESTOSTERONE) 200 MG/ML injection Inject 0.6 mLs (120 mg total) into the muscle 2 (two) times a week.  . Turmeric (QC TUMERIC COMPLEX) 500 MG CAPS Take 2 capsules by mouth daily.      Depression screen [facility] 2/9 10/09/2019  Decreased Interest 0  Down, Depressed, Hopeless 0  PHQ - 2 Score 0     Objective:   Today's Vitals: BP 130/80 (BP Location: Right Arm, Patient Position: Sitting, Cuff Size: Normal)   Pulse 88   Temp 98 F (36.7 C) (Temporal)   Resp 18   Ht 6' (1.829 m)   Wt 172 lb 3.2 oz (78.1 kg)   SpO2 98% Comment: wear mask.  BMI  23.35 kg/m  Vitals with BMI 01/30/2020 12/18/2019 12/02/2019  Height 6\' 0"  5\' 10"  5\' 10"   Weight 172 lbs 3 oz 169 lbs 13 oz 170 lbs  BMI 23.35 33.83 29.19  Systolic 166 060 045  Diastolic 80 70 80  Pulse 88 97 78     Physical Exam  He looks systemically well.  He has gained 3 pounds since the last visit, blood pressure is excellent.     Assessment   1. Malaise and fatigue   2. Decreased libido without sexual dysfunction   3. Arthritis   4. Cold intolerance       Tests ordered Orders Placed This Encounter  Procedures  . Testosterone Total,Free,Bio, Males  . T3, free  . T4  . TSH     Plan: 1. I am going to check his testosterone levels today and see if we need to adjust him even further. 2. I will also check his thyroid function as I think he probably has symptoms of  thyroid deficiency. 3. Today I also recommended to him that he should take DHEA over-the-counter 25 mg daily and then increase it to 25 mg twice a day. 4. We also talked about a plant-based diet.  He normally eats animal protein 3-4 times a week and I have told him to reduce this and focus on a plant-based diet especially beans on a daily basis.  He likes beans and most vegetables so I think this will be a fairly easy transition. 5. I will see him in about 6 weeks for follow-up to see his progress.   No orders of the defined types were placed in this encounter.   Doree Albee, MD

## 2020-01-31 LAB — TESTOSTERONE TOTAL,FREE,BIO, MALES
Albumin: 4 g/dL (ref 3.6–5.1)
Sex Hormone Binding: 71 nmol/L (ref 22–77)
Testosterone, Bioavailable: 313 ng/dL — ABNORMAL HIGH (ref 15.0–150.0)
Testosterone, Free: 170.2 pg/mL — ABNORMAL HIGH (ref 6.0–73.0)
Testosterone: 1833 ng/dL — ABNORMAL HIGH (ref 250–827)

## 2020-01-31 LAB — T3, FREE: T3, Free: 3.4 pg/mL (ref 2.3–4.2)

## 2020-01-31 LAB — T4: T4, Total: 4.1 ug/dL — ABNORMAL LOW (ref 4.9–10.5)

## 2020-01-31 LAB — TSH: TSH: 3.28 mIU/L (ref 0.40–4.50)

## 2020-02-04 ENCOUNTER — Other Ambulatory Visit (INDEPENDENT_AMBULATORY_CARE_PROVIDER_SITE_OTHER): Payer: Self-pay | Admitting: Internal Medicine

## 2020-02-04 ENCOUNTER — Encounter (INDEPENDENT_AMBULATORY_CARE_PROVIDER_SITE_OTHER): Payer: Self-pay | Admitting: Internal Medicine

## 2020-02-04 MED ORDER — THYROID 60 MG PO TABS: 60.0000 mg | ORAL_TABLET | Freq: Every day | ORAL | 3 refills | Status: DC

## 2020-02-25 ENCOUNTER — Other Ambulatory Visit (INDEPENDENT_AMBULATORY_CARE_PROVIDER_SITE_OTHER): Payer: Self-pay | Admitting: Internal Medicine

## 2020-03-12 ENCOUNTER — Other Ambulatory Visit (INDEPENDENT_AMBULATORY_CARE_PROVIDER_SITE_OTHER): Payer: Self-pay | Admitting: Nurse Practitioner

## 2020-03-17 DIAGNOSIS — H26491 Other secondary cataract, right eye: Secondary | ICD-10-CM | POA: Diagnosis not present

## 2020-03-17 DIAGNOSIS — Z961 Presence of intraocular lens: Secondary | ICD-10-CM | POA: Diagnosis not present

## 2020-03-17 DIAGNOSIS — H26493 Other secondary cataract, bilateral: Secondary | ICD-10-CM | POA: Diagnosis not present

## 2020-03-17 DIAGNOSIS — H18413 Arcus senilis, bilateral: Secondary | ICD-10-CM | POA: Diagnosis not present

## 2020-03-26 ENCOUNTER — Other Ambulatory Visit: Payer: Self-pay

## 2020-03-26 ENCOUNTER — Ambulatory Visit (INDEPENDENT_AMBULATORY_CARE_PROVIDER_SITE_OTHER): Payer: Medicare HMO | Admitting: Internal Medicine

## 2020-03-26 ENCOUNTER — Other Ambulatory Visit (INDEPENDENT_AMBULATORY_CARE_PROVIDER_SITE_OTHER): Payer: Self-pay

## 2020-03-26 ENCOUNTER — Encounter (INDEPENDENT_AMBULATORY_CARE_PROVIDER_SITE_OTHER): Payer: Self-pay | Admitting: Internal Medicine

## 2020-03-26 VITALS — BP 130/70 | HR 64 | Temp 97.3°F | Resp 18 | Ht 69.0 in | Wt 169.0 lb

## 2020-03-26 DIAGNOSIS — R5383 Other fatigue: Secondary | ICD-10-CM

## 2020-03-26 DIAGNOSIS — R5381 Other malaise: Secondary | ICD-10-CM | POA: Diagnosis not present

## 2020-03-26 DIAGNOSIS — R6882 Decreased libido: Secondary | ICD-10-CM

## 2020-03-26 DIAGNOSIS — R6889 Other general symptoms and signs: Secondary | ICD-10-CM

## 2020-03-26 MED ORDER — NP THYROID 60 MG PO TABS: 60.0000 mg | ORAL_TABLET | Freq: Two times a day (BID) | ORAL | 3 refills | Status: DC

## 2020-03-26 NOTE — Progress Notes (Signed)
Metrics: Intervention Frequency ACO  Documented Smoking Status Yearly  Screened one or more times in 24 months  Cessation Counseling or  Active cessation medication Past 24 months  Past 24 months   Guideline developer: UpToDate (See UpToDate for funding source) Date Released: 2014       Wellness Office Visit  Subjective:  Patient ID: Scott Chang, male    DOB: [birth year]  Age: 79 y.o. MRN: [id number]  CC: This man comes in for follow-up of testosterone therapy, malaise and fatigue and cold intolerance. HPI  He does feel somewhat better since he started taking testosterone, thyroid and DHEA but still not where he would like to be.  He still has cold intolerance.  His energy levels are still low. He has been taking DHEA 25 mg capsules, 2 capsules every morning. Past Medical History:  Diagnosis Date  . Anxiety   . Arthritis   . Colon polyps   . Dyspnea on exertion 04/18/2019  . HLD (hyperlipidemia) 04/18/2019  . Hypercholesteremia   . Malaise and fatigue 04/18/2019  . Neuropathy    Past Surgical History:  Procedure Laterality Date  . APPENDECTOMY    . CHOLECYSTECTOMY    . COLONOSCOPY N/A 05/09/2013   Procedure: COLONOSCOPY;  Surgeon: Rogene Houston, MD;  [facility];  Service: Endoscopy;  Laterality: N/A;  1030  . COLONOSCOPY W/ BIOPSIES AND POLYPECTOMY    . Left knee arthroscopy    . Left shoulder arthroscopy     X 2     Family History  Problem Relation Age of Onset  . Chronic Renal Failure Mother   . Heart attack Father   . Cancer Brother   . Osteoarthritis Daughter   . 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   Social History   Tobacco Use  . Smoking status: Former Smoker    Packs/day: 1.00    Years: 40.00    Pack years: 40.00    Types: Cigarettes    Quit date: 06/21/1988    Years since quitting: 31.7  . Smokeless tobacco: Never Used  Substance Use  Topics  . Alcohol use: Not Currently    Current Meds  Medication Sig  . Cholecalciferol (VITAMIN D) 125 MCG (5000 UT) CAPS Take 5,000 Units by mouth 2 (two) times daily.   Marland Kitchen DHEA 25 MG CAPS Take 2 capsules by mouth daily.  Marland Kitchen gabapentin (NEURONTIN) 100 MG capsule TAKE 1 CAPSULE BY MOUTH THREE TIMES A DAY.  . Multiple Vitamin (MULTIVITAMIN WITH MINERALS) TABS tablet Take 1 tablet by mouth daily.  Vladimir Faster Glycol-Propyl Glycol (SYSTANE OP) Place 1 drop into both eyes daily as needed (Dry Eyes).  Marland Kitchen testosterone cypionate (DEPOTESTOSTERONE CYPIONATE) 200 MG/ML injection INJECT 0.6ML INTO THE MUSCLE TWICE A WEEK  . thyroid (NP THYROID) 60 MG tablet Take 1 tablet (60 mg total) by mouth daily before breakfast.  . Turmeric (QC TUMERIC COMPLEX) 500 MG CAPS Take 2 capsules by mouth daily.      Depression screen [facility] 2/9 10/09/2019  Decreased Interest 0  Down, Depressed, Hopeless 0  PHQ - 2 Score 0     Objective:   Today's Vitals: BP 130/70 (BP Location: Left Arm, Patient Position: Sitting, Cuff Size: Normal)   Pulse 64   Temp (!) 97.3 F (36.3 C) (Temporal)   Resp 18   Ht 5\' 9"  (1.753 m)   Wt 169 lb (  76.7 kg)   BMI 24.96 kg/m  Vitals with BMI 03/26/2020 01/30/2020 12/18/2019  Height 5\' 9"  6\' 0"  5\' 10"   Weight 169 lbs 172 lbs 3 oz 169 lbs 13 oz  BMI 24.95 52.84 13.24  Systolic 401 027 253  Diastolic 70 80 70  Pulse 64 88 97     Physical Exam  He looks systemically well.  No new physical findings.     Assessment   1. Decreased libido without sexual dysfunction   2. Malaise and fatigue   3. Cold intolerance       Tests ordered No orders of the defined types were placed in this encounter.    Plan: 1. I have told him that we need to further optimize hormones to see if he will feel better.  He can start going up on the testosterone dose to 0.7 mL twice a week and he can keep on increasing it by 0.1 mL twice a week every couple of weeks depending on how he feels.  He  should reach a maximum dose of testosterone 1 mL twice a week. 2. I have told him to increase his NP thyroid dose to 60 mg twice a day and have sent a new prescription. 3. He can also increase the DHEA to a dose of 50 mg twice a day. 4. Follow-up in about 6 weeks to see how he is doing and we will likely check blood work then.   Meds ordered this encounter  Medications  . NP THYROID 60 MG tablet    Sig: Take 1 tablet (60 mg total) by mouth 2 (two) times daily.    Dispense:  60 tablet    Refill:  3    Danyeal Akens Luther Parody, MD

## 2020-04-13 ENCOUNTER — Other Ambulatory Visit (INDEPENDENT_AMBULATORY_CARE_PROVIDER_SITE_OTHER): Payer: Self-pay | Admitting: Internal Medicine

## 2020-04-13 MED ORDER — TESTOSTERONE CYPIONATE 200 MG/ML IM SOLN: 200.0000 mg | INTRAMUSCULAR | 2 refills | Status: DC

## 2020-05-11 ENCOUNTER — Encounter (INDEPENDENT_AMBULATORY_CARE_PROVIDER_SITE_OTHER): Payer: Self-pay | Admitting: Internal Medicine

## 2020-05-11 ENCOUNTER — Ambulatory Visit (INDEPENDENT_AMBULATORY_CARE_PROVIDER_SITE_OTHER): Payer: Medicare HMO | Admitting: Internal Medicine

## 2020-05-11 ENCOUNTER — Other Ambulatory Visit: Payer: Self-pay

## 2020-05-11 VITALS — BP 132/76 | HR 146 | Temp 97.3°F | Ht 69.0 in | Wt 172.2 lb

## 2020-05-11 DIAGNOSIS — R5383 Other fatigue: Secondary | ICD-10-CM

## 2020-05-11 DIAGNOSIS — R6889 Other general symptoms and signs: Secondary | ICD-10-CM | POA: Diagnosis not present

## 2020-05-11 DIAGNOSIS — G4700 Insomnia, unspecified: Secondary | ICD-10-CM

## 2020-05-11 DIAGNOSIS — R6882 Decreased libido: Secondary | ICD-10-CM

## 2020-05-11 DIAGNOSIS — R5381 Other malaise: Secondary | ICD-10-CM | POA: Diagnosis not present

## 2020-05-11 MED ORDER — TESTOSTERONE CYPIONATE 200 MG/ML IM SOLN: 200.0000 mg | INTRAMUSCULAR | 2 refills | Status: DC

## 2020-05-11 NOTE — Progress Notes (Signed)
Metrics: Intervention Frequency ACO  Documented Smoking Status Yearly  Screened one or more times in 24 months  Cessation Counseling or  Active cessation medication Past 24 months  Past 24 months   Guideline developer: UpToDate (See UpToDate for funding source) Date Released: 2014       Wellness Office Visit  Subjective:  Patient ID: Scott Chang, male    DOB: 04-Aug-1940  Age: 79 y.o. MRN: [id number]  CC: This man comes in for follow-up regarding his symptoms of malaise, cold intolerance, decreased libido and insomnia. HPI  Since the last time I saw him, he has been increasing his testosterone and now he is up to taking 200 mg intramuscular twice a week, he just started that this week.  He is also taking NP thyroid 60 mg twice a day and has increased his DHEA to 50 mg twice a day.  Overall, he actually does feel that he has more days where he feels better now and more energized. However, today he told me that he does still not sleep very well, often getting only 2 to 4 hours sleep at night. He uses melatonin but only 1 mg at night from [facility]. Past Medical History:  Diagnosis Date  . Anxiety   . Arthritis   . Colon polyps   . Dyspnea on exertion 04/18/2019  . HLD (hyperlipidemia) 04/18/2019  . Hypercholesteremia   . Malaise and fatigue 04/18/2019  . Neuropathy    Past Surgical History:  Procedure Laterality Date  . APPENDECTOMY    . CHOLECYSTECTOMY    . COLONOSCOPY N/A 05/09/2013   Procedure: COLONOSCOPY;  Surgeon: Rogene Houston, MD;  [facility];  Service: Endoscopy;  Laterality: N/A;  1030  . COLONOSCOPY W/ BIOPSIES AND POLYPECTOMY    . Left knee arthroscopy    . Left shoulder arthroscopy     X 2     Family History  Problem Relation Age of Onset  . Chronic Renal Failure Mother   . Heart attack Father   . Cancer Brother   . Osteoarthritis Daughter   . 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   Social History   Tobacco Use  . Smoking status: Former Smoker    Packs/day: 1.00    Years: 40.00    Pack years: 40.00    Types: Cigarettes    Quit date: 06/21/1988    Years since quitting: 31.9  . Smokeless tobacco: Never Used  Substance Use Topics  . Alcohol use: Not Currently    Current Meds  Medication Sig  . Cholecalciferol (VITAMIN D) 125 MCG (5000 UT) CAPS Take 5,000 Units by mouth 2 (two) times daily.   Marland Kitchen DHEA 25 MG CAPS Take 2 capsules by mouth 2 (two) times daily.   Marland Kitchen gabapentin (NEURONTIN) 100 MG capsule TAKE 1 CAPSULE BY MOUTH THREE TIMES A DAY.  . Multiple Vitamin (MULTIVITAMIN WITH MINERALS) TABS tablet Take 1 tablet by mouth daily.  . NP THYROID 60 MG tablet Take 1 tablet (60 mg total) by mouth 2 (two) times daily.  Vladimir Faster Glycol-Propyl Glycol (SYSTANE OP) Place 1 drop into both eyes daily as needed (Dry Eyes).  . prednisoLONE acetate (PRED FORTE) 1 % ophthalmic suspension SMARTSIG:In Eye(s)  . testosterone cypionate (DEPOTESTOSTERONE CYPIONATE) 200 MG/ML injection Inject 1 mL (200 mg total) into the muscle 2 (two) times a week. New dose+  .  thyroid (NP THYROID) 60 MG tablet Take 1 tablet (60 mg total) by mouth daily before breakfast. (Patient taking differently: Take 60 mg by mouth 2 (two) times daily. )  . Turmeric (QC TUMERIC COMPLEX) 500 MG CAPS Take 2 capsules by mouth daily.  . [DISCONTINUED] testosterone cypionate (DEPOTESTOSTERONE CYPIONATE) 200 MG/ML injection Inject 1 mL (200 mg total) into the muscle 2 (two) times a week. New dose+      Depression screen [facility] 2/9 05/11/2020 10/09/2019  Decreased Interest 1 0  Down, Depressed, Hopeless 1 0  PHQ - 2 Score 2 0  Altered sleeping 2 -  Tired, decreased energy 2 -  Change in appetite 0 -  Feeling bad or failure about yourself  0 -  Trouble concentrating 1 -  Moving slowly or fidgety/restless 0 -  Suicidal thoughts 0 -  PHQ-9 Score 7 -  Difficult doing  work/chores Somewhat difficult -     Objective:   Today's Vitals: BP 132/76   Pulse (!) 146   Temp (!) 97.3 F (36.3 C) (Temporal)   Ht 5\' 9"  (1.753 m)   Wt 172 lb 3.2 oz (78.1 kg)   SpO2 96%   BMI 25.43 kg/m  Vitals with BMI 05/11/2020 03/26/2020 01/30/2020  Height 5\' 9"  5\' 9"  6\' 0"   Weight 172 lbs 3 oz 169 lbs 172 lbs 3 oz  BMI 25.42 71.21 97.58  Systolic 832 549 826  Diastolic 76 70 80  Pulse 415 64 88     Physical Exam   He looks systemically well.  No new physical findings today.    Assessment   1. Cold intolerance   2. Malaise and fatigue   3. Decreased libido without sexual dysfunction   4. Insomnia, unspecified type       Tests ordered No orders of the defined types were placed in this encounter.    Plan: 1. He will continue with the same dose of NP thyroid 60 mg twice a day, off label, for symptoms of cold intolerance and fatigue. 2. He will continue with high-dose testosterone therapy. 3. He will continue with DHEA 50 mg twice a day. 4. We discussed the use of melatonin to promote sleep and I have told him he can continue to increase the dose until he gets effect. 5. Follow-up in about 3 months to see how he is doing.   Meds ordered this encounter  Medications  . testosterone cypionate (DEPOTESTOSTERONE CYPIONATE) 200 MG/ML injection    Sig: Inject 1 mL (200 mg total) into the muscle 2 (two) times a week. New dose+    Dispense:  10 mL    Refill:  2    Tamyia Minich Luther Parody, MD

## 2020-05-18 ENCOUNTER — Encounter (INDEPENDENT_AMBULATORY_CARE_PROVIDER_SITE_OTHER): Payer: Self-pay | Admitting: *Deleted

## 2020-07-08 ENCOUNTER — Other Ambulatory Visit (INDEPENDENT_AMBULATORY_CARE_PROVIDER_SITE_OTHER): Payer: Self-pay | Admitting: Internal Medicine

## 2020-07-23 ENCOUNTER — Other Ambulatory Visit (INDEPENDENT_AMBULATORY_CARE_PROVIDER_SITE_OTHER): Payer: Self-pay | Admitting: Internal Medicine

## 2020-07-29 ENCOUNTER — Telehealth (INDEPENDENT_AMBULATORY_CARE_PROVIDER_SITE_OTHER): Payer: Self-pay

## 2020-07-29 ENCOUNTER — Other Ambulatory Visit (INDEPENDENT_AMBULATORY_CARE_PROVIDER_SITE_OTHER): Payer: Self-pay | Admitting: Internal Medicine

## 2020-07-29 MED ORDER — TESTOSTERONE CYPIONATE 200 MG/ML IM SOLN: 200.0000 mg | INTRAMUSCULAR | 2 refills | Status: DC

## 2020-07-29 NOTE — Telephone Encounter (Signed)
Requests refill on testosterone injection to [facility]

## 2020-08-12 ENCOUNTER — Other Ambulatory Visit: Payer: Self-pay

## 2020-08-12 ENCOUNTER — Encounter (INDEPENDENT_AMBULATORY_CARE_PROVIDER_SITE_OTHER): Payer: Self-pay | Admitting: Internal Medicine

## 2020-08-12 ENCOUNTER — Ambulatory Visit (INDEPENDENT_AMBULATORY_CARE_PROVIDER_SITE_OTHER): Payer: Medicare HMO | Admitting: Internal Medicine

## 2020-08-12 VITALS — BP 138/86 | HR 61 | Temp 97.8°F | Ht 69.0 in | Wt 175.6 lb

## 2020-08-12 DIAGNOSIS — R5383 Other fatigue: Secondary | ICD-10-CM

## 2020-08-12 DIAGNOSIS — R6882 Decreased libido: Secondary | ICD-10-CM

## 2020-08-12 DIAGNOSIS — E559 Vitamin D deficiency, unspecified: Secondary | ICD-10-CM | POA: Diagnosis not present

## 2020-08-12 DIAGNOSIS — R5381 Other malaise: Secondary | ICD-10-CM | POA: Diagnosis not present

## 2020-08-12 DIAGNOSIS — G4700 Insomnia, unspecified: Secondary | ICD-10-CM | POA: Diagnosis not present

## 2020-08-12 DIAGNOSIS — R6889 Other general symptoms and signs: Secondary | ICD-10-CM | POA: Diagnosis not present

## 2020-08-12 NOTE — Progress Notes (Signed)
Metrics: Intervention Frequency ACO  Documented Smoking Status Yearly  Screened one or more times in 24 months  Cessation Counseling or  Active cessation medication Past 24 months  Past 24 months   Guideline developer: UpToDate (See UpToDate for funding source) Date Released: 2014       Wellness Office Visit  Subjective:  Patient ID: Scott Chang, male    DOB: 11/12/1940  Age: 80 y.o. MRN: [id number]  CC: This man comes in for follow-up regarding his testosterone therapy, thyroid therapy and DHEA as well as vitamin D deficiency. HPI  Overall, he says he is doing okay but does not seem to feel great like he wants to.  However, on closer questioning, he keeps very active, he exercises with weights, he walks every single day at least 1-1/2 miles. He is tolerating testosterone twice a week, NP thyroid twice a day and DHEA daily.  He also is taking vitamin D3 daily. He uses melatonin for insomnia and the dose appears to be melatonin 6 mg at night from life extension.  Higher dose seems to make his head somewhat full/dizzy. Past Medical History:  Diagnosis Date  . Anxiety   . Arthritis   . Colon polyps   . Dyspnea on exertion 04/18/2019  . HLD (hyperlipidemia) 04/18/2019  . Hypercholesteremia   . Malaise and fatigue 04/18/2019  . Neuropathy    Past Surgical History:  Procedure Laterality Date  . APPENDECTOMY    . CHOLECYSTECTOMY    . COLONOSCOPY N/A 05/09/2013   Procedure: COLONOSCOPY;  Surgeon: Rogene Houston, MD;  Location: [facility];  Service: Endoscopy;  Laterality: N/A;  1030  . COLONOSCOPY W/ BIOPSIES AND POLYPECTOMY    . Left knee arthroscopy    . Left shoulder arthroscopy     X 2     Family History  Problem Relation Age of Onset  . Chronic Renal Failure Mother   . Heart attack Father   . Cancer Brother   . Osteoarthritis Daughter   . Colon cancer Neg Hx     Social History   Social History Narrative   Married for 52 years.Retired ,previously in  Architect.   Social History   Tobacco Use  . Smoking status: Former Smoker    Packs/day: 1.00    Years: 40.00    Pack years: 40.00    Types: Cigarettes    Quit date: 06/21/1988    Years since quitting: 32.1  . Smokeless tobacco: Never Used  Substance Use Topics  . Alcohol use: Not Currently    Current Meds  Medication Sig  . Cholecalciferol (VITAMIN D) 125 MCG (5000 UT) CAPS Take 5,000 Units by mouth 2 (two) times daily.   Marland Kitchen DHEA 25 MG CAPS Take 2 capsules by mouth 2 (two) times daily.   Marland Kitchen gabapentin (NEURONTIN) 100 MG capsule TAKE 1 CAPSULE BY MOUTH THREE TIMES A DAY.  . melatonin 3 MG TABS tablet Take 6 mg by mouth at bedtime.  . Multiple Vitamin (MULTIVITAMIN WITH MINERALS) TABS tablet Take 1 tablet by mouth daily.  . NP THYROID 60 MG tablet TAKE 1 TABLET(60 MG) BY MOUTH TWICE DAILY  . Polyethyl Glycol-Propyl Glycol (SYSTANE OP) Place 1 drop into both eyes daily as needed (Dry Eyes).  . prednisoLONE acetate (PRED FORTE) 1 % ophthalmic suspension SMARTSIG:In Eye(s)  . testosterone cypionate (DEPOTESTOSTERONE CYPIONATE) 200 MG/ML injection Inject 1 mL (200 mg total) into the muscle 2 (two) times a week. New dose+  . thyroid (NP THYROID) 60  MG tablet Take 1 tablet (60 mg total) by mouth daily before breakfast. (Patient taking differently: Take 60 mg by mouth 2 (two) times daily.)  . Turmeric 500 MG CAPS Take 2 capsules by mouth daily.     [place] Office Visit from 08/12/2020 in [facility]  PHQ-9 Total Score 6      Objective:   Today's Vitals: BP 138/86   Pulse 61   Temp 97.8 F (36.6 C) (Temporal)   Ht 5\' 9"  (1.753 m)   Wt 175 lb 9.6 oz (79.7 kg)   SpO2 99%   BMI 25.93 kg/m  Vitals with BMI 08/12/2020 05/11/2020 03/26/2020  Height 5\' 9"  5\' 9"  5\' 9"   Weight 175 lbs 10 oz 172 lbs 3 oz 169 lbs  BMI 25.92 37.10 62.69  Systolic 485 462 703  Diastolic 86 76 70  Pulse 61 146 64     Physical Exam   He looks systemically well.  He appears to be more  muscular than I have seen him before.    Assessment   1. Malaise and fatigue   2. Cold intolerance   3. Insomnia, unspecified type   4. Decreased libido without sexual dysfunction   5. Vitamin D deficiency disease       Tests ordered Orders Placed This Encounter  Procedures  . COMPLETE METABOLIC PANEL WITH GFR  . CBC  . DHEA-sulfate  . T3, free  . TSH  . Testosterone Total,Free,Bio, Males  . VITAMIN D 25 Hydroxy (Vit-D Deficiency, Fractures)     Plan: 1. He will continue with testosterone, NP thyroid, DHEA and vitamin D3 supplementation. 2. Blood work is ordered. 3. Further recommendations will depend on all these results and I will see him in 3 months time for follow-up and we need to also check a PSA as it will of been just over 1 year.   No orders of the defined types were placed in this encounter.   Doree Albee, MD

## 2020-08-13 LAB — COMPLETE METABOLIC PANEL WITH GFR
AG Ratio: 1.6 (calc) (ref 1.0–2.5)
ALT: 45 U/L (ref 9–46)
AST: 47 U/L — ABNORMAL HIGH (ref 10–35)
Albumin: 4.2 g/dL (ref 3.6–5.1)
Alkaline phosphatase (APISO): 69 U/L (ref 35–144)
BUN: 14 mg/dL (ref 7–25)
CO2: 30 mmol/L (ref 20–32)
Calcium: 9 mg/dL (ref 8.6–10.3)
Chloride: 102 mmol/L (ref 98–110)
Creat: 1.1 mg/dL (ref 0.70–1.18)
GFR, Est African American: 75 mL/min/{1.73_m2} (ref >=60)
GFR, Est Non African American: 64 mL/min/{1.73_m2} (ref >=60)
Globulin: 2.7 g/dL (calc) (ref 1.9–3.7)
Glucose, Bld: 98 mg/dL (ref 65–139)
Potassium: 4.6 mmol/L (ref 3.5–5.3)
Sodium: 137 mmol/L (ref 135–146)
Total Bilirubin: 0.6 mg/dL (ref 0.2–1.2)
Total Protein: 6.9 g/dL (ref 6.1–8.1)

## 2020-08-13 LAB — TESTOSTERONE TOTAL,FREE,BIO, MALES
Albumin: 4.2 g/dL (ref 3.6–5.1)
Sex Hormone Binding: 50 nmol/L (ref 22–77)
Testosterone, Bioavailable: 806.9 ng/dL — ABNORMAL HIGH (ref 15.0–150.0)
Testosterone, Free: 418.9 pg/mL — ABNORMAL HIGH (ref 6.0–73.0)
Testosterone: 2515 ng/dL — ABNORMAL HIGH (ref 250–827)

## 2020-08-13 LAB — CBC
HCT: 47.8 % (ref 38.5–50.0)
Hemoglobin: 15.8 g/dL (ref 13.2–17.1)
MCH: 29.5 pg (ref 27.0–33.0)
MCHC: 33.1 g/dL (ref 32.0–36.0)
MCV: 89.2 fL (ref 80.0–100.0)
MPV: 11.6 fL (ref 7.5–12.5)
Platelets: 196 10*3/uL (ref 140–400)
RBC: 5.36 10*6/uL (ref 4.20–5.80)
RDW: 13.6 % (ref 11.0–15.0)
WBC: 7.7 10*3/uL (ref 3.8–10.8)

## 2020-08-13 LAB — TSH: TSH: 1.29 mIU/L (ref 0.40–4.50)

## 2020-08-13 LAB — VITAMIN D 25 HYDROXY (VIT D DEFICIENCY, FRACTURES): Vit D, 25-Hydroxy: 106 ng/mL — ABNORMAL HIGH (ref 30–100)

## 2020-08-13 LAB — DHEA-SULFATE: DHEA-SO4: 990 ug/dL — ABNORMAL HIGH (ref 3–225)

## 2020-08-13 LAB — T3, FREE: T3, Free: 6.1 pg/mL — ABNORMAL HIGH (ref 2.3–4.2)

## 2020-09-02 ENCOUNTER — Telehealth (INDEPENDENT_AMBULATORY_CARE_PROVIDER_SITE_OTHER): Payer: Self-pay

## 2020-09-02 ENCOUNTER — Other Ambulatory Visit (INDEPENDENT_AMBULATORY_CARE_PROVIDER_SITE_OTHER): Payer: Self-pay | Admitting: Internal Medicine

## 2020-09-02 MED ORDER — MECLIZINE HCL 25 MG PO TABS: 25.0000 mg | ORAL_TABLET | Freq: Three times a day (TID) | ORAL | 0 refills | Status: DC | PRN

## 2020-09-02 NOTE — Telephone Encounter (Signed)
Called daughter and gave her the message. Daughter verbalized an understanding and thanked [place].

## 2020-09-02 NOTE — Telephone Encounter (Signed)
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 Daughter is asking if you can send Meclizine in for her dad to help with the nausea/dizziness spells?  Please advise.

## 2020-09-02 NOTE — Telephone Encounter (Signed)
Okay let the patient/daughter know that I have sent this to [facility] and he should only take it maybe once a day as needed.  If it does not help, he needs to make an appointment to be seen regarding his symptoms.

## 2020-09-23 DIAGNOSIS — H5210 Myopia, unspecified eye: Secondary | ICD-10-CM | POA: Diagnosis not present

## 2020-09-23 DIAGNOSIS — Z0100 Encounter for examination of eyes and vision without abnormal findings: Secondary | ICD-10-CM | POA: Diagnosis not present

## 2020-11-09 ENCOUNTER — Other Ambulatory Visit (INDEPENDENT_AMBULATORY_CARE_PROVIDER_SITE_OTHER): Payer: Self-pay | Admitting: Internal Medicine

## 2020-11-09 NOTE — Telephone Encounter (Signed)
refill 

## 2020-11-11 ENCOUNTER — Other Ambulatory Visit: Payer: Self-pay

## 2020-11-11 ENCOUNTER — Ambulatory Visit (INDEPENDENT_AMBULATORY_CARE_PROVIDER_SITE_OTHER): Payer: Medicare HMO | Admitting: Internal Medicine

## 2020-11-11 ENCOUNTER — Encounter (INDEPENDENT_AMBULATORY_CARE_PROVIDER_SITE_OTHER): Payer: Self-pay | Admitting: Internal Medicine

## 2020-11-11 VITALS — BP 128/70 | HR 77 | Temp 98.7°F | Resp 18 | Ht 69.0 in | Wt 175.0 lb

## 2020-11-11 DIAGNOSIS — R42 Dizziness and giddiness: Secondary | ICD-10-CM | POA: Diagnosis not present

## 2020-11-11 DIAGNOSIS — R6889 Other general symptoms and signs: Secondary | ICD-10-CM

## 2020-11-11 DIAGNOSIS — R110 Nausea: Secondary | ICD-10-CM

## 2020-11-11 DIAGNOSIS — R5383 Other fatigue: Secondary | ICD-10-CM

## 2020-11-11 DIAGNOSIS — R5381 Other malaise: Secondary | ICD-10-CM

## 2020-11-11 DIAGNOSIS — E559 Vitamin D deficiency, unspecified: Secondary | ICD-10-CM

## 2020-11-11 DIAGNOSIS — R6882 Decreased libido: Secondary | ICD-10-CM

## 2020-11-11 NOTE — Progress Notes (Signed)
Pt said he feels sick most all the time. But is is just in his head. No congestion. Like a nagging & nausea feeling in my head. But it don't stop him for doing what he needs to do.

## 2020-11-11 NOTE — Progress Notes (Signed)
Metrics: Intervention Frequency ACO  Documented Smoking Status Yearly  Screened one or more times in 24 months  Cessation Counseling or  Active cessation medication Past 24 months  Past 24 months   Guideline developer: UpToDate (See UpToDate for funding source) Date Released: 2014       Wellness Office Visit  Subjective:  Patient ID: Scott Chang, male    DOB: [birth year]  Age: 80 y.o. MRN: [id number]  CC: This man comes in for follow-up regarding his testosterone therapy, thyroid therapy and his symptoms of fatigue. HPI  He is now complaining of several month history of nausea with dizziness.  He denies any kind of headache.  He has not had a syncopal episode or fall.  He denies any confusion, speech difficulties or limb weakness.  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 He wonders whether he requires ongoing testosterone therapy. Past Medical History:  Diagnosis Date  . Anxiety   . Arthritis   . Colon polyps   . Dyspnea on exertion 04/18/2019  . HLD (hyperlipidemia) 04/18/2019  . Hypercholesteremia   . Malaise and fatigue 04/18/2019  . Neuropathy    Past Surgical History:  Procedure Laterality Date  . APPENDECTOMY    . CHOLECYSTECTOMY    . COLONOSCOPY N/A 05/09/2013   Procedure: COLONOSCOPY;  Surgeon: Rogene Houston, MD;  Location: [facility];  Service: Endoscopy;  Laterality: N/A;  1030  . COLONOSCOPY W/ BIOPSIES AND POLYPECTOMY    . Left knee arthroscopy    . Left shoulder arthroscopy     X 2     Family History  Problem Relation Age of Onset  . Chronic Renal Failure Mother   . Heart attack Father   . Cancer Brother   . Osteoarthritis Daughter   . 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   Social History   Tobacco Use  . Smoking status: Former Smoker    Packs/day: 1.00    Years: 40.00    Pack years: 40.00    Types:  Cigarettes    Quit date: 06/21/1988    Years since quitting: 32.4  . Smokeless tobacco: Never Used  Substance Use Topics  . Alcohol use: Not Currently    Current Meds  Medication Sig  . Cholecalciferol (VITAMIN D) 125 MCG (5000 UT) CAPS Take 5,000 Units by mouth 2 (two) times daily.   Marland Kitchen DHEA 25 MG CAPS Take 2 capsules by mouth 2 (two) times daily.   Marland Kitchen gabapentin (NEURONTIN) 100 MG capsule TAKE 1 CAPSULE BY MOUTH THREE TIMES A DAY.  . meclizine (ANTIVERT) 25 MG tablet Take 1 tablet (25 mg total) by mouth 3 (three) times daily as needed for dizziness.  . melatonin 3 MG TABS tablet Take 6 mg by mouth at bedtime.  . Multiple Vitamin (MULTIVITAMIN WITH MINERALS) TABS tablet Take 1 tablet by mouth daily.  . NP THYROID 60 MG tablet TAKE 1 TABLET(60 MG) BY MOUTH TWICE DAILY  . POLY HUB NEEDLE 23G X 1" MISC USE AS DIRECTED.C  . Polyethyl Glycol-Propyl Glycol (SYSTANE OP) Place 1 drop into both eyes daily as needed (Dry Eyes).  . prednisoLONE acetate (PRED FORTE) 1 % ophthalmic suspension SMARTSIG:In Eye(s)  . testosterone cypionate (DEPOTESTOSTERONE CYPIONATE) 200 MG/ML injection Inject 1 mL (200 mg total) into the muscle 2 (two) times a week.  . thyroid (NP THYROID) 60  MG tablet Take 1 tablet (60 mg total) by mouth daily before breakfast. (Patient taking differently: Take 60 mg by mouth 2 (two) times daily.)  . Turmeric 500 MG CAPS Take 2 capsules by mouth daily.     [facility] Office Visit from 08/12/2020 in [facility]  PHQ-9 Total Score 6      Objective:   Today's Vitals: BP 128/70 (BP Location: Left Arm, Patient Position: Sitting, Cuff Size: Normal)   Pulse 77   Temp 98.7 F (37.1 C) (Temporal)   Resp 18   Ht 5\' 9"  (1.753 m)   Wt 175 lb (79.4 kg)   SpO2 93%   BMI 25.84 kg/m  Vitals with BMI 11/11/2020 08/12/2020 05/11/2020  Height 5\' 9"  5\' 9"  5\' 9"   Weight 175 lbs 175 lbs 10 oz 172 lbs 3 oz  BMI 25.83 14.97 02.63  Systolic 785 885 027  Diastolic 70 86 76  Pulse  77 61 146     Physical Exam   He is alert and orientated without any obvious focal neurological signs.    Assessment   1. Malaise and fatigue   2. Cold intolerance   3. Decreased libido without sexual dysfunction   4. Vitamin D deficiency disease   5. Dizziness   6. Nausea       Tests ordered Orders Placed This Encounter  Procedures  . MR BRAIN WO CONTRAST     Plan: 1. After discussion, we agreed that it would discontinue testosterone therapy and see how he feels over the next several months.  If he does not feel as well, he may consider going back on testosterone therapy. 2. I am slightly concerned about his nausea which is more chronic associated with the dizziness.  Although he does not have any focal neurological signs, I would prefer to get an MRI brain scan to further evaluate and I will order this now. 3. I will see him in September for his annual physical exam.  We will do all the blood work then.   No orders of the defined types were placed in this encounter.   Doree Albee, MD

## 2020-11-25 ENCOUNTER — Encounter (INDEPENDENT_AMBULATORY_CARE_PROVIDER_SITE_OTHER): Payer: Self-pay | Admitting: Internal Medicine

## 2020-11-25 ENCOUNTER — Other Ambulatory Visit (INDEPENDENT_AMBULATORY_CARE_PROVIDER_SITE_OTHER): Payer: Self-pay | Admitting: Internal Medicine

## 2020-11-25 MED ORDER — TESTOSTERONE CYPIONATE 200 MG/ML IM SOLN: 200.0000 mg | INTRAMUSCULAR | 3 refills | Status: DC

## 2020-11-25 MED ORDER — GABAPENTIN 100 MG PO CAPS: ORAL_CAPSULE | ORAL | 3 refills | Status: DC

## 2020-11-27 ENCOUNTER — Other Ambulatory Visit (INDEPENDENT_AMBULATORY_CARE_PROVIDER_SITE_OTHER): Payer: Self-pay | Admitting: Internal Medicine

## 2020-12-01 ENCOUNTER — Ambulatory Visit (HOSPITAL_COMMUNITY)
Admission: RE | Admit: 2020-12-01 | Discharge: 2020-12-01 | Disposition: A | Discharge status: Home / Self Care | Payer: Medicare HMO | Source: Ambulatory Visit | Attending: Internal Medicine | Admitting: Internal Medicine

## 2020-12-01 ENCOUNTER — Other Ambulatory Visit: Payer: Self-pay

## 2020-12-01 DIAGNOSIS — G319 Degenerative disease of nervous system, unspecified: Secondary | ICD-10-CM | POA: Diagnosis not present

## 2020-12-01 DIAGNOSIS — G9389 Other specified disorders of brain: Secondary | ICD-10-CM | POA: Diagnosis not present

## 2020-12-01 DIAGNOSIS — R519 Headache, unspecified: Secondary | ICD-10-CM | POA: Diagnosis not present

## 2020-12-01 DIAGNOSIS — R42 Dizziness and giddiness: Secondary | ICD-10-CM | POA: Insufficient documentation

## 2020-12-01 DIAGNOSIS — R110 Nausea: Secondary | ICD-10-CM | POA: Diagnosis not present

## 2020-12-01 DIAGNOSIS — R531 Weakness: Secondary | ICD-10-CM | POA: Diagnosis not present

## 2020-12-01 DIAGNOSIS — R270 Ataxia, unspecified: Secondary | ICD-10-CM | POA: Diagnosis not present

## 2020-12-16 ENCOUNTER — Encounter (INDEPENDENT_AMBULATORY_CARE_PROVIDER_SITE_OTHER): Payer: Self-pay | Admitting: Internal Medicine

## 2020-12-16 ENCOUNTER — Other Ambulatory Visit (INDEPENDENT_AMBULATORY_CARE_PROVIDER_SITE_OTHER): Payer: Self-pay | Admitting: Nurse Practitioner

## 2020-12-16 DIAGNOSIS — L989 Disorder of the skin and subcutaneous tissue, unspecified: Secondary | ICD-10-CM

## 2020-12-16 NOTE — Progress Notes (Signed)
Referral to Dr. Denna Haggard

## 2020-12-16 NOTE — Progress Notes (Signed)
Done; pt may get appt in 2-3 months

## 2021-01-12 ENCOUNTER — Other Ambulatory Visit: Payer: Self-pay

## 2021-01-12 ENCOUNTER — Ambulatory Visit (INDEPENDENT_AMBULATORY_CARE_PROVIDER_SITE_OTHER): Payer: Medicare HMO | Admitting: Dermatology

## 2021-01-12 ENCOUNTER — Encounter: Payer: Self-pay | Admitting: Dermatology

## 2021-01-12 DIAGNOSIS — Z1283 Encounter for screening for malignant neoplasm of skin: Secondary | ICD-10-CM | POA: Diagnosis not present

## 2021-01-12 DIAGNOSIS — L814 Other melanin hyperpigmentation: Secondary | ICD-10-CM

## 2021-01-12 DIAGNOSIS — L300 Nummular dermatitis: Secondary | ICD-10-CM | POA: Diagnosis not present

## 2021-01-12 MED ORDER — CLOBETASOL PROPIONATE 0.05 % EX CREA: 1.0000 "application " | TOPICAL_CREAM | Freq: Two times a day (BID) | CUTANEOUS | 2 refills | Status: DC

## 2021-01-12 NOTE — Patient Instructions (Signed)
Phone call in a month to let us know that you are better.

## 2021-01-29 ENCOUNTER — Encounter: Payer: Self-pay | Admitting: Dermatology

## 2021-01-29 NOTE — Progress Notes (Signed)
   New Patient   Subjective  Scott Chang is a 80 y.o. male who presents for the following: Skin Problem (Left post leg a 7 months it does itch now its a dark area clotrimazole betamethasone dip no help, left post shoulder raised area x years. Per patient wife he was a old patient no chart found).  .  Discolored area back of right lower leg, check skin Location:  Duration:  Quality:  Associated Signs/Symptoms: Modifying Factors:  Severity:  Timing: Context:    The following portions of the chart were reviewed this encounter and updated as appropriate:  Tobacco  Allergies  Meds  Problems  Med Hx  Surg Hx  Fam Hx      Objective  Well appearing patient in no apparent distress; mood and affect are within normal limits. Mid Back Full body skin exam.  No atypical pigmented lesions or nonmelanoma skin cancer.  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  No known historical change.  Although dermoscopy shows no atypical features, margin and shape but does prompt some concern.       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   No Tinea on foot.  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    A full examination was performed including scalp, head, eyes, ears, nose, lips, neck, chest, axillae, abdomen, back, buttocks, bilateral upper extremities, bilateral lower extremities, hands, feet, fingers, toes, fingernails, and toenails. All findings within normal limits unless otherwise noted below.  Area beneath undergarment not fully examined.   Assessment & Plan  Screening for malignant neoplasm of skin Mid Back  Yearly skin exam.  Lentigo Left Parotid Area  To obtain confirmatory biopsy but patient prefers to leave this unless there is clinical change.  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  clobetasol cream (TEMOVATE) 0.05 % - Left Lower Leg - Anterior Apply 1 application topically 2 (two) times daily.

## 2021-02-16 ENCOUNTER — Telehealth: Payer: Self-pay | Admitting: Dermatology

## 2021-02-16 NOTE — Telephone Encounter (Signed)
Patient was told to call and give update on lesion of thigh. He states that medication is helping. Lesion is not itching anymore and it is now smooth. Patient has no concerns and just wanted to let ST know that he is better.

## 2021-03-03 ENCOUNTER — Encounter (INDEPENDENT_AMBULATORY_CARE_PROVIDER_SITE_OTHER): Payer: Medicare HMO | Admitting: Internal Medicine

## 2021-03-09 ENCOUNTER — Ambulatory Visit: Payer: Medicare HMO | Admitting: Dermatology

## 2021-03-15 DIAGNOSIS — Z6820 Body mass index (BMI) 20.0-20.9, adult: Secondary | ICD-10-CM | POA: Diagnosis not present

## 2021-03-15 DIAGNOSIS — G4700 Insomnia, unspecified: Secondary | ICD-10-CM | POA: Diagnosis not present

## 2021-03-15 DIAGNOSIS — M069 Rheumatoid arthritis, unspecified: Secondary | ICD-10-CM | POA: Diagnosis not present

## 2021-03-15 DIAGNOSIS — E291 Testicular hypofunction: Secondary | ICD-10-CM | POA: Diagnosis not present

## 2021-03-22 DIAGNOSIS — E559 Vitamin D deficiency, unspecified: Secondary | ICD-10-CM | POA: Diagnosis not present

## 2021-03-22 DIAGNOSIS — Z125 Encounter for screening for malignant neoplasm of prostate: Secondary | ICD-10-CM | POA: Diagnosis not present

## 2021-03-22 DIAGNOSIS — E291 Testicular hypofunction: Secondary | ICD-10-CM | POA: Diagnosis not present

## 2021-03-22 DIAGNOSIS — G4700 Insomnia, unspecified: Secondary | ICD-10-CM | POA: Diagnosis not present

## 2021-03-22 DIAGNOSIS — Z13220 Encounter for screening for lipoid disorders: Secondary | ICD-10-CM | POA: Diagnosis not present

## 2021-03-22 DIAGNOSIS — M069 Rheumatoid arthritis, unspecified: Secondary | ICD-10-CM | POA: Diagnosis not present

## 2021-03-22 DIAGNOSIS — Z1329 Encounter for screening for other suspected endocrine disorder: Secondary | ICD-10-CM | POA: Diagnosis not present

## 2021-03-22 DIAGNOSIS — E785 Hyperlipidemia, unspecified: Secondary | ICD-10-CM | POA: Diagnosis not present

## 2021-03-22 DIAGNOSIS — Z131 Encounter for screening for diabetes mellitus: Secondary | ICD-10-CM | POA: Diagnosis not present

## 2021-05-12 DIAGNOSIS — F419 Anxiety disorder, unspecified: Secondary | ICD-10-CM | POA: Diagnosis not present

## 2021-05-12 DIAGNOSIS — F4321 Adjustment disorder with depressed mood: Secondary | ICD-10-CM | POA: Diagnosis not present

## 2021-06-22 DIAGNOSIS — F4321 Adjustment disorder with depressed mood: Secondary | ICD-10-CM | POA: Diagnosis not present

## 2021-06-22 DIAGNOSIS — G4700 Insomnia, unspecified: Secondary | ICD-10-CM | POA: Diagnosis not present

## 2021-06-22 DIAGNOSIS — M069 Rheumatoid arthritis, unspecified: Secondary | ICD-10-CM | POA: Diagnosis not present

## 2021-06-22 DIAGNOSIS — E291 Testicular hypofunction: Secondary | ICD-10-CM | POA: Diagnosis not present

## 2021-06-22 DIAGNOSIS — N400 Enlarged prostate without lower urinary tract symptoms: Secondary | ICD-10-CM | POA: Diagnosis not present

## 2021-06-25 DIAGNOSIS — Z20828 Contact with and (suspected) exposure to other viral communicable diseases: Secondary | ICD-10-CM | POA: Diagnosis not present

## 2021-06-25 DIAGNOSIS — J069 Acute upper respiratory infection, unspecified: Secondary | ICD-10-CM | POA: Diagnosis not present

## 2021-06-25 DIAGNOSIS — Z6825 Body mass index (BMI) 25.0-25.9, adult: Secondary | ICD-10-CM | POA: Diagnosis not present

## 2021-06-30 DIAGNOSIS — J40 Bronchitis, not specified as acute or chronic: Secondary | ICD-10-CM | POA: Diagnosis not present

## 2021-06-30 DIAGNOSIS — Z6825 Body mass index (BMI) 25.0-25.9, adult: Secondary | ICD-10-CM | POA: Diagnosis not present

## 2021-06-30 DIAGNOSIS — J329 Chronic sinusitis, unspecified: Secondary | ICD-10-CM | POA: Diagnosis not present

## 2021-07-07 DIAGNOSIS — Z6825 Body mass index (BMI) 25.0-25.9, adult: Secondary | ICD-10-CM | POA: Diagnosis not present

## 2021-07-07 DIAGNOSIS — Z1331 Encounter for screening for depression: Secondary | ICD-10-CM | POA: Diagnosis not present

## 2021-07-07 DIAGNOSIS — J329 Chronic sinusitis, unspecified: Secondary | ICD-10-CM | POA: Diagnosis not present

## 2021-07-07 DIAGNOSIS — Z1389 Encounter for screening for other disorder: Secondary | ICD-10-CM | POA: Diagnosis not present

## 2021-07-07 DIAGNOSIS — J40 Bronchitis, not specified as acute or chronic: Secondary | ICD-10-CM | POA: Diagnosis not present

## 2021-07-22 DIAGNOSIS — N400 Enlarged prostate without lower urinary tract symptoms: Secondary | ICD-10-CM | POA: Diagnosis not present

## 2021-07-22 DIAGNOSIS — G4700 Insomnia, unspecified: Secondary | ICD-10-CM | POA: Diagnosis not present

## 2021-07-22 DIAGNOSIS — F4321 Adjustment disorder with depressed mood: Secondary | ICD-10-CM | POA: Diagnosis not present

## 2021-07-22 DIAGNOSIS — J40 Bronchitis, not specified as acute or chronic: Secondary | ICD-10-CM | POA: Diagnosis not present

## 2021-07-22 DIAGNOSIS — Z6825 Body mass index (BMI) 25.0-25.9, adult: Secondary | ICD-10-CM | POA: Diagnosis not present

## 2021-07-22 DIAGNOSIS — E291 Testicular hypofunction: Secondary | ICD-10-CM | POA: Diagnosis not present

## 2021-07-22 DIAGNOSIS — J329 Chronic sinusitis, unspecified: Secondary | ICD-10-CM | POA: Diagnosis not present

## 2021-07-22 DIAGNOSIS — M069 Rheumatoid arthritis, unspecified: Secondary | ICD-10-CM | POA: Diagnosis not present

## 2021-08-18 DIAGNOSIS — Z131 Encounter for screening for diabetes mellitus: Secondary | ICD-10-CM | POA: Diagnosis not present

## 2021-08-18 DIAGNOSIS — Z1329 Encounter for screening for other suspected endocrine disorder: Secondary | ICD-10-CM | POA: Diagnosis not present

## 2021-09-21 ENCOUNTER — Ambulatory Visit (INDEPENDENT_AMBULATORY_CARE_PROVIDER_SITE_OTHER): Payer: Medicare HMO | Admitting: Internal Medicine

## 2021-09-21 ENCOUNTER — Encounter: Payer: Self-pay | Admitting: Internal Medicine

## 2021-09-21 VITALS — BP 138/82 | HR 73 | Resp 18 | Ht 70.5 in | Wt 163.6 lb

## 2021-09-21 DIAGNOSIS — L84 Corns and callosities: Secondary | ICD-10-CM

## 2021-09-21 DIAGNOSIS — R3915 Urgency of urination: Secondary | ICD-10-CM

## 2021-09-21 DIAGNOSIS — N401 Enlarged prostate with lower urinary tract symptoms: Secondary | ICD-10-CM

## 2021-09-21 DIAGNOSIS — R5382 Chronic fatigue, unspecified: Secondary | ICD-10-CM | POA: Diagnosis not present

## 2021-09-21 DIAGNOSIS — H8110 Benign paroxysmal vertigo, unspecified ear: Secondary | ICD-10-CM

## 2021-09-21 DIAGNOSIS — M5136 Other intervertebral disc degeneration, lumbar region: Secondary | ICD-10-CM

## 2021-09-21 DIAGNOSIS — R351 Nocturia: Secondary | ICD-10-CM | POA: Diagnosis not present

## 2021-09-21 DIAGNOSIS — M51369 Other intervertebral disc degeneration, lumbar region without mention of lumbar back pain or lower extremity pain: Secondary | ICD-10-CM

## 2021-09-21 HISTORY — DX: Urgency of urination: R39.15

## 2021-09-21 HISTORY — DX: Benign paroxysmal vertigo, unspecified ear: H81.10

## 2021-09-21 HISTORY — DX: Benign prostatic hyperplasia with lower urinary tract symptoms: N40.1

## 2021-09-21 HISTORY — DX: Corns and callosities: L84

## 2021-09-21 LAB — POCT URINALYSIS DIP (CLINITEK)
Bilirubin, UA: NEGATIVE
Glucose, UA: NEGATIVE mg/dL
Ketones, POC UA: NEGATIVE mg/dL
Leukocytes, UA: NEGATIVE
Nitrite, UA: NEGATIVE
POC PROTEIN,UA: NEGATIVE
Spec Grav, UA: 1.015 (ref 1.010–1.025)
Urobilinogen, UA: 0.2 E.U./dL
pH, UA: 6.5 (ref 5.0–8.0)

## 2021-09-21 MED ORDER — MECLIZINE HCL 25 MG PO TABS: 25.0000 mg | ORAL_TABLET | Freq: Two times a day (BID) | ORAL | 1 refills | Status: DC | PRN

## 2021-09-21 MED ORDER — ALFUZOSIN HCL ER 10 MG PO TB24: 10.0000 mg | ORAL_TABLET | Freq: Every day | ORAL | 3 refills | Status: DC

## 2021-09-21 NOTE — Assessment & Plan Note (Signed)
Chronic low back pain ?Takes gabapentin ?Mobic as needed for back pain and OA of knee and feet ?

## 2021-09-21 NOTE — Assessment & Plan Note (Signed)
Has tried Flomax with no relief ?UA reviewed today ?Will give a trial of Uroxatrol for now ?Check PSA ?

## 2021-09-21 NOTE — Assessment & Plan Note (Signed)
Unclear etiology, could be overflow incontinence from BPH ?UA reviewed today -showed slight blood, will check urine culture ?

## 2021-09-21 NOTE — Assessment & Plan Note (Signed)
Advised to use Compound W for now ?

## 2021-09-21 NOTE — Patient Instructions (Addendum)
Please continue taking medications as prescribed. ? ?Please take Meclizine as needed for dizziness. ? ?Please perform simple vestibular exercises at home as discussed. ?Inner Ear Balance Home Exercises to Treat Dizziness (Vestibular Home Exercises) ?

## 2021-09-21 NOTE — Assessment & Plan Note (Addendum)
Was placed on NP thyroid and testosterone in the past by previous PCP, but he did not like it and stopped taking them ?Check TSH and free T4, CBC ?Recent CMP reviewed - unremarkable ?

## 2021-09-21 NOTE — Assessment & Plan Note (Signed)
Meclizine as needed for now ?Advised to perform vestibular exercises, video link provided ?Avoid sudden positional changes ?Advised to maintain adequate hydration and avoid skipping any meals ?

## 2021-09-21 NOTE — Progress Notes (Signed)
? ?New Patient Office Visit ? ?Subjective:  ?Patient ID: Scott Chang, male    DOB: 1941/01/22  Age: 81 y.o. MRN: [id number] ? ?CC:  ?Chief Complaint  ?Patient presents with  ? New Patient (Initial Visit)  ?  New patient was being seen at [facility] by gordan williams pt has pain in hip down to legs for about 3 weeks has arthritis also has vertigo on and off and has small lesion on pituitary gland pt has calluses on feet and this makes it hard to walk   ? ? ?HPI ?Scott Chang is a 81 y.o. male with past medical history of OA, DDD of lumbar spine and BPPV who presents for establishing care. ? ?He complains of dizziness upon sitting, but denies any dizziness upon walking.  He also reports that it sometimes corrects when he wears his glasses. Denies any ear pain or discharge currently. He has had formal evaluation for dizziness a long time ago, and was diagnosed with Vertigo. He has used Meclizine in the past. ? ?He complains of chronic low back pain and hip pain radiating to his b/l LE.  He has a history of primary OA, for which he has been taking meloxicam as needed.  He also has a history of chronic low back pain, for which he takes gabapentin.  Denies any numbness or tingling of the feet. ? ?He also reports having calluses on his feet, which caused pain upon walking.  Denies any recent injury. ? ?He also complains of urinary urgency and nocturia.  Denies any weak urinary stream, dysuria or hematuria currently.  Denies any fever or chills currently. ? ? ? ? ?Past Medical History:  ?Diagnosis Date  ? Anxiety   ? Arthritis   ? Colon polyps   ? Dyspnea on exertion 04/18/2019  ? HLD (hyperlipidemia) 04/18/2019  ? Hypercholesteremia   ? Malaise and fatigue 04/18/2019  ? Neuropathy   ? ? ?Past Surgical History:  ?Procedure Laterality Date  ? APPENDECTOMY    ? CHOLECYSTECTOMY    ? COLONOSCOPY N/A 05/09/2013  ? Procedure: COLONOSCOPY;  Surgeon: Rogene Houston, MD;  [facility];  Service: Endoscopy;   Laterality: N/A;  1030  ? COLONOSCOPY W/ BIOPSIES AND POLYPECTOMY    ? Left knee arthroscopy    ? Left shoulder arthroscopy    ? X 2  ? ? ?Family History  ?Problem Relation Age of Onset  ? Chronic Renal Failure Mother   ? Heart attack Father   ? Cancer Brother   ? Osteoarthritis Daughter   ? Colon cancer Neg Hx   ? ? ?Social History  ? ?Socioeconomic History  ? Marital status: Married  ?  Spouse name: Not on file  ? Number of children: Not on file  ? Years of education: Not on file  ? Highest education level: Not on file  ?Occupational History  ? Not on file  ?Tobacco Use  ? Smoking status: Former  ?  Packs/day: 1.00  ?  Years: 40.00  ?  Pack years: 40.00  ?  Types: Cigarettes  ?  Quit date: 06/21/1988  ?  Years since quitting: 33.2  ? Smokeless tobacco: Never  ?Vaping Use  ? Vaping Use: Never used  ?Substance and Sexual Activity  ? Alcohol use: Not Currently  ? Drug use: No  ? Sexual activity: Not on file  ?Other Topics Concern  ? Not on file  ?Social History Narrative  ? 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  ? ?Social Determinants of Health  ? ?Financial Resource Strain: Not on file  ?Food Insecurity: Not on file  ?Transportation Needs: Not on file  ?Physical Activity: Not on file  ?Stress: Not on file  ?Social Connections: Not on file  ?Intimate Partner Violence: Not on file  ? ? ?ROS ?Review of Systems  ?Constitutional:  Positive for fatigue. Negative for chills and fever.  ?HENT:  Negative for congestion and sore throat.   ?Eyes:  Negative for pain and discharge.  ?Respiratory:  Negative for cough and shortness of breath.   ?Cardiovascular:  Negative for chest pain and palpitations.  ?Gastrointestinal:  Negative for diarrhea, nausea and vomiting.  ?Endocrine: Negative for polydipsia and polyuria.  ?Genitourinary:  Positive for urgency. Negative for dysuria and hematuria.  ?     Nocturia  ?Musculoskeletal:  Negative for neck pain and neck stiffness.  ?Skin:  Negative for rash.  ?Neurological:   Positive for dizziness. Negative for weakness, numbness and headaches.  ?Psychiatric/Behavioral:  Negative for agitation and behavioral problems.   ? ?Objective:  ? ?Today's Vitals: BP 138/82 (BP Location: Right Arm, Patient Position: Sitting, Cuff Size: Normal)   Pulse 73   Resp 18   Ht 5' 10.5" (1.791 m)   Wt 163 lb 9.6 oz (74.2 kg)   SpO2 98%   BMI 23.14 kg/m?  ? ?Physical Exam ?Vitals reviewed.  ?Constitutional:   ?   General: He is not in acute distress. ?   Appearance: He is not diaphoretic.  ?HENT:  ?   Head: Normocephalic and atraumatic.  ?   Nose: Nose normal.  ?   Mouth/Throat:  ?   Mouth: Mucous membranes are moist.  ?Eyes:  ?   General: No scleral icterus. ?   Extraocular Movements: Extraocular movements intact.  ?Cardiovascular:  ?   Rate and Rhythm: Normal rate and regular rhythm.  ?   Pulses: Normal pulses.  ?   Heart sounds: Normal heart sounds. No murmur heard. ?Pulmonary:  ?   Breath sounds: Normal breath sounds. No wheezing or rales.  ?Musculoskeletal:  ?   Cervical back: Neck supple. No tenderness.  ?   Right lower leg: No edema.  ?   Left lower leg: No edema.  ?Skin: ?   General: Skin is warm.  ?   Findings: No rash.  ?Neurological:  ?   General: No focal deficit present.  ?   Mental Status: He is alert and oriented to person, place, and time.  ?   Cranial Nerves: No cranial nerve deficit.  ?   Sensory: No sensory deficit.  ?   Motor: No weakness.  ?Psychiatric:     ?   Mood and Affect: Mood normal.     ?   Behavior: Behavior normal.  ? ? ?Assessment & Plan:  ? ?Problem List Items Addressed This Visit   ? ?  ? Nervous and Auditory  ? Benign paroxysmal positional vertigo - Primary  ?  Meclizine as needed for now ?Advised to perform vestibular exercises, video link provided ?Avoid sudden positional changes ?Advised to maintain adequate hydration and avoid skipping any meals ?  ?  ? Relevant Medications  ? meclizine (ANTIVERT) 25 MG tablet  ?  ? Musculoskeletal and Integument  ? DDD  (degenerative disc disease), lumbar  ?  Chronic low back pain ?Takes gabapentin ?Mobic as needed for back pain and OA of knee and feet ?  ?  ? Relevant Medications  ? meloxicam (MOBIC)  7.5 MG tablet  ? Callus of foot  ?  Advised to use Compound W for now ?  ?  ?  ? Other  ? Chronic fatigue  ?  Was placed on NP thyroid and testosterone in the past by previous PCP, but he did not like it and stopped taking them ?Check TSH and free T4, CBC ?Recent CMP reviewed - unremarkable ?  ?  ? Relevant Orders  ? TSH + free T4  ? CBC with Differential/Platelet  ? Benign prostatic hyperplasia with nocturia  ?  Has tried Flomax with no relief ?UA reviewed today ?Will give a trial of Uroxatrol for now ?Check PSA ?  ?  ? Relevant Medications  ? alfuzosin (UROXATRAL) 10 MG 24 hr tablet  ? Other Relevant Orders  ? PSA  ? Urinary urgency  ?  Unclear etiology, could be overflow incontinence from BPH ?UA reviewed today -showed slight blood, will check urine culture ?  ?  ? Relevant Orders  ? PSA  ? POCT URINALYSIS DIP (CLINITEK)  ? Urine Culture  ? ? ?Outpatient Encounter Medications as of 09/21/2021  ?Medication Sig  ? alfuzosin (UROXATRAL) 10 MG 24 hr tablet Take 1 tablet (10 mg total) by mouth daily with breakfast.  ? gabapentin (NEURONTIN) 100 MG capsule TAKE 1 CAPSULE BY MOUTH THREE TIMES A DAY.  ? meclizine (ANTIVERT) 25 MG tablet Take 1 tablet (25 mg total) by mouth 2 (two) times daily as needed for dizziness.  ? meloxicam (MOBIC) 7.5 MG tablet Take 7.5 mg by mouth daily as needed.  ? Multiple Vitamin (MULTIVITAMIN WITH MINERALS) TABS tablet Take 1 tablet by mouth daily.  ? POLY HUB NEEDLE 23G X 1" MISC USE AS DIRECTED.C  ? Polyethyl Glycol-Propyl Glycol (SYSTANE OP) Place 1 drop into both eyes daily as needed (Dry Eyes).  ? prednisoLONE acetate (PRED FORTE) 1 % ophthalmic suspension SMARTSIG:In Eye(s)  ? [DISCONTINUED] meclizine (ANTIVERT) 25 MG tablet Take 1 tablet (25 mg total) by mouth 3 (three) times daily as needed for  dizziness.  ? [DISCONTINUED] tamsulosin (FLOMAX) 0.4 MG CAPS capsule Take 0.8 mg by mouth daily.  ? [DISCONTINUED] Cholecalciferol (VITAMIN D) 125 MCG (5000 UT) CAPS Take 5,000 Units by mouth 2 (two) times daily.  (Patient not tak

## 2021-09-22 ENCOUNTER — Other Ambulatory Visit: Payer: Self-pay | Admitting: *Deleted

## 2021-09-22 LAB — CBC WITH DIFFERENTIAL/PLATELET
Basophils Absolute: 0.1 10*3/uL (ref 0.0–0.2)
Basos: 1 %
EOS (ABSOLUTE): 0.2 10*3/uL (ref 0.0–0.4)
Eos: 3 %
Hematocrit: 42.2 % (ref 37.5–51.0)
Hemoglobin: 14.2 g/dL (ref 13.0–17.7)
Immature Grans (Abs): 0 10*3/uL (ref 0.0–0.1)
Immature Granulocytes: 0 %
Lymphocytes Absolute: 2.1 10*3/uL (ref 0.7–3.1)
Lymphs: 29 %
MCH: 31.5 pg (ref 26.6–33.0)
MCHC: 33.6 g/dL (ref 31.5–35.7)
MCV: 94 fL (ref 79–97)
Monocytes Absolute: 0.7 10*3/uL (ref 0.1–0.9)
Monocytes: 9 %
Neutrophils Absolute: 4.3 10*3/uL (ref 1.4–7.0)
Neutrophils: 58 %
Platelets: 208 10*3/uL (ref 150–450)
RBC: 4.51 x10E6/uL (ref 4.14–5.80)
RDW: 16.3 % — ABNORMAL HIGH (ref 11.6–15.4)
WBC: 7.5 10*3/uL (ref 3.4–10.8)

## 2021-09-22 LAB — PSA: Prostate Specific Ag, Serum: 0.3 ng/mL (ref 0.0–4.0)

## 2021-09-22 LAB — TSH+FREE T4
Free T4: 0.97 ng/dL (ref 0.82–1.77)
TSH: 5.91 u[IU]/mL — ABNORMAL HIGH (ref 0.450–4.500)

## 2021-09-23 ENCOUNTER — Encounter: Payer: Self-pay | Admitting: Internal Medicine

## 2021-09-23 LAB — URINE CULTURE: Organism ID, Bacteria: NO GROWTH

## 2021-09-30 DIAGNOSIS — M9903 Segmental and somatic dysfunction of lumbar region: Secondary | ICD-10-CM | POA: Diagnosis not present

## 2021-09-30 DIAGNOSIS — M47816 Spondylosis without myelopathy or radiculopathy, lumbar region: Secondary | ICD-10-CM | POA: Diagnosis not present

## 2021-09-30 DIAGNOSIS — M5442 Lumbago with sciatica, left side: Secondary | ICD-10-CM | POA: Diagnosis not present

## 2021-10-04 DIAGNOSIS — M9903 Segmental and somatic dysfunction of lumbar region: Secondary | ICD-10-CM | POA: Diagnosis not present

## 2021-10-04 DIAGNOSIS — M5442 Lumbago with sciatica, left side: Secondary | ICD-10-CM | POA: Diagnosis not present

## 2021-10-04 DIAGNOSIS — M47816 Spondylosis without myelopathy or radiculopathy, lumbar region: Secondary | ICD-10-CM | POA: Diagnosis not present

## 2021-10-07 DIAGNOSIS — M9903 Segmental and somatic dysfunction of lumbar region: Secondary | ICD-10-CM | POA: Diagnosis not present

## 2021-10-07 DIAGNOSIS — M5442 Lumbago with sciatica, left side: Secondary | ICD-10-CM | POA: Diagnosis not present

## 2021-10-07 DIAGNOSIS — M47816 Spondylosis without myelopathy or radiculopathy, lumbar region: Secondary | ICD-10-CM | POA: Diagnosis not present

## 2021-10-11 DIAGNOSIS — M9903 Segmental and somatic dysfunction of lumbar region: Secondary | ICD-10-CM | POA: Diagnosis not present

## 2021-10-11 DIAGNOSIS — M5442 Lumbago with sciatica, left side: Secondary | ICD-10-CM | POA: Diagnosis not present

## 2021-10-11 DIAGNOSIS — M47816 Spondylosis without myelopathy or radiculopathy, lumbar region: Secondary | ICD-10-CM | POA: Diagnosis not present

## 2021-10-14 ENCOUNTER — Telehealth: Payer: Self-pay

## 2021-10-14 DIAGNOSIS — M9903 Segmental and somatic dysfunction of lumbar region: Secondary | ICD-10-CM | POA: Diagnosis not present

## 2021-10-14 DIAGNOSIS — M5442 Lumbago with sciatica, left side: Secondary | ICD-10-CM | POA: Diagnosis not present

## 2021-10-14 DIAGNOSIS — M47816 Spondylosis without myelopathy or radiculopathy, lumbar region: Secondary | ICD-10-CM | POA: Diagnosis not present

## 2021-10-14 NOTE — Telephone Encounter (Signed)
Patient returning lab result call 

## 2021-10-14 NOTE — Telephone Encounter (Signed)
Pt advised with verbal understanding  °

## 2021-10-18 DIAGNOSIS — M9903 Segmental and somatic dysfunction of lumbar region: Secondary | ICD-10-CM | POA: Diagnosis not present

## 2021-10-18 DIAGNOSIS — M47816 Spondylosis without myelopathy or radiculopathy, lumbar region: Secondary | ICD-10-CM | POA: Diagnosis not present

## 2021-10-18 DIAGNOSIS — M5442 Lumbago with sciatica, left side: Secondary | ICD-10-CM | POA: Diagnosis not present

## 2021-10-22 DIAGNOSIS — M5442 Lumbago with sciatica, left side: Secondary | ICD-10-CM | POA: Diagnosis not present

## 2021-10-22 DIAGNOSIS — M9903 Segmental and somatic dysfunction of lumbar region: Secondary | ICD-10-CM | POA: Diagnosis not present

## 2021-10-22 DIAGNOSIS — M47816 Spondylosis without myelopathy or radiculopathy, lumbar region: Secondary | ICD-10-CM | POA: Diagnosis not present

## 2021-11-03 ENCOUNTER — Ambulatory Visit: Payer: Medicare HMO | Admitting: Internal Medicine

## 2022-01-12 ENCOUNTER — Ambulatory Visit: Payer: Medicare HMO | Admitting: Dermatology

## 2022-01-12 DIAGNOSIS — L509 Urticaria, unspecified: Secondary | ICD-10-CM

## 2022-01-12 DIAGNOSIS — Z1283 Encounter for screening for malignant neoplasm of skin: Secondary | ICD-10-CM | POA: Diagnosis not present

## 2022-01-12 DIAGNOSIS — L821 Other seborrheic keratosis: Secondary | ICD-10-CM | POA: Diagnosis not present

## 2022-01-12 DIAGNOSIS — D485 Neoplasm of uncertain behavior of skin: Secondary | ICD-10-CM | POA: Diagnosis not present

## 2022-01-12 NOTE — Patient Instructions (Signed)

## 2022-01-17 ENCOUNTER — Ambulatory Visit (INDEPENDENT_AMBULATORY_CARE_PROVIDER_SITE_OTHER): Payer: Medicare HMO

## 2022-01-17 ENCOUNTER — Encounter: Payer: Self-pay | Admitting: Internal Medicine

## 2022-01-17 ENCOUNTER — Ambulatory Visit (INDEPENDENT_AMBULATORY_CARE_PROVIDER_SITE_OTHER): Payer: Medicare HMO | Admitting: Internal Medicine

## 2022-01-17 VITALS — BP 132/68 | HR 75 | Resp 18 | Ht 70.0 in | Wt 157.4 lb

## 2022-01-17 DIAGNOSIS — R351 Nocturia: Secondary | ICD-10-CM | POA: Diagnosis not present

## 2022-01-17 DIAGNOSIS — M159 Polyosteoarthritis, unspecified: Secondary | ICD-10-CM | POA: Diagnosis not present

## 2022-01-17 DIAGNOSIS — E039 Hypothyroidism, unspecified: Secondary | ICD-10-CM

## 2022-01-17 DIAGNOSIS — E038 Other specified hypothyroidism: Secondary | ICD-10-CM | POA: Diagnosis not present

## 2022-01-17 DIAGNOSIS — Z0000 Encounter for general adult medical examination without abnormal findings: Secondary | ICD-10-CM

## 2022-01-17 DIAGNOSIS — Z91030 Bee allergy status: Secondary | ICD-10-CM

## 2022-01-17 DIAGNOSIS — N401 Enlarged prostate with lower urinary tract symptoms: Secondary | ICD-10-CM | POA: Diagnosis not present

## 2022-01-17 DIAGNOSIS — R3915 Urgency of urination: Secondary | ICD-10-CM

## 2022-01-17 DIAGNOSIS — M1990 Unspecified osteoarthritis, unspecified site: Secondary | ICD-10-CM | POA: Insufficient documentation

## 2022-01-17 DIAGNOSIS — R5382 Chronic fatigue, unspecified: Secondary | ICD-10-CM

## 2022-01-17 HISTORY — DX: Hypothyroidism, unspecified: E03.9

## 2022-01-17 HISTORY — DX: Unspecified osteoarthritis, unspecified site: M19.90

## 2022-01-17 MED ORDER — OXYBUTYNIN CHLORIDE ER 10 MG PO TB24: 10.0000 mg | ORAL_TABLET | Freq: Every day | ORAL | 3 refills | Status: DC

## 2022-01-17 MED ORDER — CELECOXIB 100 MG PO CAPS: 100.0000 mg | ORAL_CAPSULE | Freq: Every day | ORAL | 3 refills | Status: DC

## 2022-01-17 NOTE — Progress Notes (Signed)
Subjective:   Scott Chang is a 81 y.o. male who presents for an Initial Medicare Annual Wellness Visit.  Review of Systems     Scott Chang , Thank you for taking time to come for your Medicare Wellness Visit. I appreciate your ongoing commitment to your health goals. Please review the following plan we discussed and let me know if I can assist you in the future.   These are the goals we discussed:  Goals   None     This is a list of the screening recommended for you and due dates:  Health Maintenance  Topic Date Due   COVID-19 Vaccine (4 - Booster for Pfizer series) 06/16/2020   Flu Shot  01/18/2022   Tetanus Vaccine  11/15/2028   Pneumonia Vaccine  Completed   Hepatitis C Screening: USPSTF Recommendation to screen - Ages 18-79 yo.  Completed   Zoster (Shingles) Vaccine  Completed   HPV Vaccine  Aged Out   Colon Cancer Screening  Discontinued          Objective:    There were no vitals filed for this visit. There is no height or weight on file to calculate BMI.     05/09/2013    9:44 AM  Advanced Directives  Does Patient Have a Medical Advance Directive? Patient has advance directive, copy not in chart  Type of Advance Directive Living will  Pre-existing out of facility DNR order (yellow form or pink MOST form) No    Current Medications (verified) Outpatient Encounter Medications as of 01/17/2022  Medication Sig   alfuzosin (UROXATRAL) 10 MG 24 hr tablet Take 1 tablet (10 mg total) by mouth daily with breakfast.   gabapentin (NEURONTIN) 100 MG capsule TAKE 1 CAPSULE BY MOUTH THREE TIMES A DAY.   meclizine (ANTIVERT) 25 MG tablet Take 1 tablet (25 mg total) by mouth 2 (two) times daily as needed for dizziness.   meloxicam (MOBIC) 7.5 MG tablet Take 7.5 mg by mouth daily as needed.   Multiple Vitamin (MULTIVITAMIN WITH MINERALS) TABS tablet Take 1 tablet by mouth daily.   Polyethyl Glycol-Propyl Glycol (SYSTANE OP) Place 1 drop into both eyes daily as needed (Dry  Eyes).   prednisoLONE acetate (PRED FORTE) 1 % ophthalmic suspension SMARTSIG:In Eye(s)   No facility-administered encounter medications on file as of 01/17/2022.    Allergies (verified) Bee venom and Penicillins   History: Past Medical History:  Diagnosis Date   Anxiety    Arthritis    Colon polyps    Dyspnea on exertion 04/18/2019   HLD (hyperlipidemia) 04/18/2019   Hypercholesteremia    Malaise and fatigue 04/18/2019   Neuropathy    Past Surgical History:  Procedure Laterality Date   APPENDECTOMY     CHOLECYSTECTOMY     COLONOSCOPY N/A 05/09/2013   Procedure: COLONOSCOPY;  Surgeon: Rogene Houston, MD;  [facility];  Service: Endoscopy;  Laterality: N/A;  21   COLONOSCOPY W/ BIOPSIES AND POLYPECTOMY     Left knee arthroscopy     Left shoulder arthroscopy     X 2   Family History  Problem Relation Age of Onset   Chronic Renal Failure Mother    Heart attack Father    Cancer Brother    Osteoarthritis Daughter    Colon cancer Neg Hx    Social History   Socioeconomic History   Marital status: Married    Spouse name: Not on file   Number of children: Not on  file   Years of education: Not on file   Highest education level: Not on file  Occupational History   Not on file  Tobacco Use   Smoking status: Former    Packs/day: 1.00    Years: 40.00    Total pack years: 40.00    Types: Cigarettes    Quit date: 06/21/1988    Years since quitting: 33.5   Smokeless tobacco: Never  Vaping Use   Vaping Use: Never used  Substance and Sexual Activity   Alcohol use: Not Currently   Drug use: No   Sexual activity: Not on file  Other Topics Concern   Not on file  Social History Narrative   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   Social Determinants of Health   Financial Resource Strain: Not on file  Food Insecurity: Not on file  Transportation Needs: Not on file  Physical Activity: Not on file  Stress: Not on file  Social  Connections: Not on file    Tobacco Counseling Counseling given: Not Answered   Clinical Intake:                 Diabetic? No         Activities of Daily Living     No data to display           Patient Care Team: Lindell Spar, MD as PCP - General (Internal Medicine) Herminio Commons, MD (Inactive) as PCP - Cardiology (Cardiology) Lavonna Monarch, MD as Consulting Physician (Dermatology)  Indicate any recent Medical Services you may have received from other than [facility] providers in the past year (date may be approximate).     Assessment:   This is a routine wellness examination for Scott Chang.  Hearing/Vision screen No results found.  Dietary issues and exercise activities discussed:     Goals Addressed   None   Depression Screen    01/17/2022    8:31 AM 09/21/2021    9:51 AM 08/12/2020    8:06 AM 05/11/2020    9:13 AM 10/09/2019    8:42 AM  PHQ 2/9 Scores  PHQ - 2 Score 0 0 1 2 0  PHQ- 9 Score   6 7   Exception Documentation     Medical reason    Fall Risk    01/17/2022    8:31 AM 09/21/2021    9:51 AM 08/12/2020    8:07 AM 05/11/2020    9:14 AM 10/09/2019    8:42 AM  Fall Risk   Falls in the past year? 0 1 0 0 0  Number falls in past yr: 0 1   0  Injury with Fall? 0 0   0  Risk for fall due to : No Fall Risks History of fall(s);Impaired balance/gait   No Fall Risks  Follow up Falls evaluation completed Falls evaluation completed;Falls prevention discussed   Falls evaluation completed    FALL RISK PREVENTION PERTAINING TO THE HOME:  Any stairs in or around the home? Yes  If so, are there any without handrails? No  Home free of loose throw rugs in walkways, pet beds, electrical cords, etc? Yes  Adequate lighting in your home to reduce risk of falls? Yes   ASSISTIVE DEVICES UTILIZED TO PREVENT FALLS:  Life alert? No  Use of a cane, walker or w/c? No  Grab bars in the bathroom? Yes  Shower chair or bench in shower? Yes  Elevated  toilet seat or a handicapped toilet? Yes  TIMED UP AND GO:  Was the test performed? Yes .  Length of time to ambulate 10 feet: 30 sec.   Gait steady and fast without use of assistive device  Cognitive Function:        Immunizations Immunization History  Administered Date(s) Administered   Fluad Quad(high Dose 65+) 03/27/2019   Influenza, High Dose Seasonal PF 03/27/2019   Influenza-Unspecified 03/26/2020   PFIZER(Purple Top)SARS-COV-2 Vaccination 08/15/2019, 09/05/2019, 04/21/2020   Pneumococcal Conjugate-13 03/26/2020   Pneumococcal Polysaccharide-23 08/27/2018, 08/27/2018   Td 11/16/2018   Zoster Recombinat (Shingrix) 08/27/2018, 08/27/2018, 01/30/2019    TDAP status: Up to date  Flu Vaccine status: Up to date  Pneumococcal vaccine status: Up to date  Covid-19 vaccine status: Completed vaccines  Qualifies for Shingles Vaccine? Yes   Zostavax completed Yes   Shingrix Completed?: Yes  Screening Tests Health Maintenance  Topic Date Due   COVID-19 Vaccine (4 - Booster for Pfizer series) 06/16/2020   INFLUENZA VACCINE  01/18/2022   TETANUS/TDAP  11/15/2028   Pneumonia Vaccine 2+ Years old  Completed   Hepatitis C Screening  Completed   Zoster Vaccines- Shingrix  Completed   HPV VACCINES  Aged Out   COLONOSCOPY (Pts 45-39yr Insurance coverage will need to be confirmed)  DWataugaMaintenance Due  Topic Date Due   COVID-19 Vaccine (4 - Booster for PBlairseries) 06/16/2020    Colorectal cancer screening: No longer required.   Lung Cancer Screening: (Low Dose CT Chest recommended if Age 79-80years, 30 pack-year currently smoking OR have quit w/in 15years.) does not qualify.    Additional Screening:  Hepatitis C Screening: does qualify; Completed 07/27/2018  Vision Screening: Recommended annual ophthalmology exams for early detection of glaucoma and other disorders of the eye. Is the patient up to date with their  annual eye exam?  No  schedule for next month Who is the provider or what is the name of the office in which the patient attends annual eye exams? Dr. JRonnald Rampin [facility]  Dental Screening: Recommended annual dental exams for proper oral hygiene  Community Resource Referral / Chronic Care Management: CRR required this visit?  No   CCM required this visit?  No      Plan:     I have personally reviewed and noted the following in the patient's chart:   Medical and social history Use of alcohol, tobacco or illicit drugs  Current medications and supplements including opioid prescriptions. Patient is not currently taking opioid prescriptions. Functional ability and status Nutritional status Physical activity Advanced directives List of other physicians Hospitalizations, surgeries, and ER visits in previous 12 months Vitals Screenings to include cognitive, depression, and falls Referrals and appointments  In addition, I have reviewed and discussed with patient certain preventive protocols, quality metrics, and best practice recommendations. A written personalized care plan for preventive services as well as general preventive health recommendations were provided to patient.     KJohny Drilling CSour John  01/17/2022   Nurse Notes:  Scott Chang, Thank you for taking time to come for your Medicare Wellness Visit. I appreciate your ongoing commitment to your health goals. Please review the following plan we discussed and let me know if I can assist you in the future.   These are the goals we discussed:  Goals   None     This is a list of the screening recommended for you and due dates:  Health Maintenance  Topic  Date Due   COVID-19 Vaccine (4 - Booster for Pfizer series) 06/16/2020   Flu Shot  01/18/2022   Tetanus Vaccine  11/15/2028   Pneumonia Vaccine  Completed   Hepatitis C Screening: USPSTF Recommendation to screen - Ages 18-79 yo.  Completed   Zoster (Shingles) Vaccine   Completed   HPV Vaccine  Aged Out   Colon Cancer Screening  Discontinued

## 2022-01-17 NOTE — Progress Notes (Signed)
Established Patient Office Visit  Subjective:  Patient ID: Scott Chang, male    DOB: 1940/07/20  Age: 82 y.o. MRN: [id number]  CC:  Chief Complaint  Patient presents with   Follow-up    4 month follow up pt says arthritis in hands and arms is bad also his ears have been itching for 3 months he also said for the last few months he has broken out in a rash that itches this comes and goes    HPI Scott Chang is a 81 y.o. male with past medical history of OA, DDD of lumbar spine and BPPV who presents for f/u of his chronic medical conditions.  He complains of chronic low back pain and hip pain radiating to his b/l LE.  He has a history of primary OA, and c/o b/l hand and arm pain.   He takes gabapentin for chronic back pain.  Denies any numbness or tingling of the feet.  He still complains of urinary urgency and nocturia.  Denies any weak urinary stream, dysuria or hematuria currently.  Denies any fever or chills currently. He was started on Uroxatral for BPH, but still has urinary urgency.  He complains of itching in his ears for the last 3 months.  Denies any ear pain or discharge.  His ear exam was overall benign today.  He reports intermittent rash over UE and LE, but does not have it currently.  Denies any recent new Chemical exposure.  He does report allergy to bee sting and requests EpiPen refill.     Past Medical History:  Diagnosis Date   Anxiety    Arthritis    Colon polyps    Dyspnea on exertion 04/18/2019   HLD (hyperlipidemia) 04/18/2019   Hypercholesteremia    Malaise and fatigue 04/18/2019   Neuropathy     Past Surgical History:  Procedure Laterality Date   APPENDECTOMY     CHOLECYSTECTOMY     COLONOSCOPY N/A 05/09/2013   Procedure: COLONOSCOPY;  Surgeon: Rogene Houston, MD;  [facility];  Service: Endoscopy;  Laterality: N/A;  80   COLONOSCOPY W/ BIOPSIES AND POLYPECTOMY     Left knee arthroscopy     Left shoulder arthroscopy     X 2     Family History  Problem Relation Age of Onset   Chronic Renal Failure Mother    Heart attack Father    Cancer Brother    Osteoarthritis Daughter    Colon cancer Neg Hx     Social History   Socioeconomic History   Marital status: Married    Spouse name: Not on file   Number of children: Not on file   Years of education: Not on file   Highest education level: Not on file  Occupational History   Not on file  Tobacco Use   Smoking status: Former    Packs/day: 1.00    Years: 40.00    Total pack years: 40.00    Types: Cigarettes    Quit date: 06/21/1988    Years since quitting: 33.6   Smokeless tobacco: Never  Vaping Use   Vaping Use: Never used  Substance and Sexual Activity   Alcohol use: Not Currently   Drug use: No   Sexual activity: Not on file  Other Topics Concern   Not on file  Social History Narrative   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   Social Determinants of Health   Financial Resource Strain: Low Risk  (  01/17/2022)   Overall Financial Resource Strain (CARDIA)    Difficulty of Paying Living Expenses: Not hard at all  Food Insecurity: No Food Insecurity (01/17/2022)   Hunger Vital Sign    Worried About Running Out of Food in the Last Year: Never true    Ran Out of Food in the Last Year: Never true  Transportation Needs: No Transportation Needs (01/17/2022)   PRAPARE - Hydrologist (Medical): No    Lack of Transportation (Non-Medical): No  Physical Activity: Sufficiently Active (01/17/2022)   Exercise Vital Sign    Days of Exercise per Week: 5 days    Minutes of Exercise per Session: 140 min  Stress: Stress Concern Present (01/17/2022)   [place]    Feeling of Stress : To some extent  Social Connections: Socially Isolated (01/17/2022)   Social Connection and Isolation Panel [NHANES]    Frequency of Communication with Friends and Family:  More than three times a week    Frequency of Social Gatherings with Friends and Family: More than three times a week    Attends Religious Services: Never    Marine scientist or Organizations: No    Attends Archivist Meetings: Never    Marital Status: Widowed  Intimate Partner Violence: Not At Risk (01/17/2022)   Humiliation, Afraid, Rape, and Kick questionnaire    Fear of Current or Ex-Partner: No    Emotionally Abused: No    Physically Abused: No    Sexually Abused: No    Outpatient Medications Prior to Visit  Medication Sig Dispense Refill   alfuzosin (UROXATRAL) 10 MG 24 hr tablet Take 1 tablet (10 mg total) by mouth daily with breakfast. 30 tablet 3   gabapentin (NEURONTIN) 100 MG capsule TAKE 1 CAPSULE BY MOUTH THREE TIMES A DAY. 90 capsule 3   meclizine (ANTIVERT) 25 MG tablet Take 1 tablet (25 mg total) by mouth 2 (two) times daily as needed for dizziness. 30 tablet 1   Multiple Vitamin (MULTIVITAMIN WITH MINERALS) TABS tablet Take 1 tablet by mouth daily.     Polyethyl Glycol-Propyl Glycol (SYSTANE OP) Place 1 drop into both eyes daily as needed (Dry Eyes).     prednisoLONE acetate (PRED FORTE) 1 % ophthalmic suspension SMARTSIG:In Eye(s)     meloxicam (MOBIC) 7.5 MG tablet Take 7.5 mg by mouth daily as needed.     No facility-administered medications prior to visit.    Allergies  Allergen Reactions   Bee Venom Anaphylaxis   Penicillins Itching and Rash    ROS Review of Systems  Constitutional:  Positive for fatigue. Negative for chills and fever.  HENT:  Negative for congestion and sore throat.   Eyes:  Negative for pain and discharge.  Respiratory:  Negative for cough and shortness of breath.   Cardiovascular:  Negative for chest pain and palpitations.  Gastrointestinal:  Negative for diarrhea, nausea and vomiting.  Endocrine: Negative for polydipsia and polyuria.  Genitourinary:  Positive for urgency. Negative for dysuria and hematuria.        Nocturia  Musculoskeletal:  Negative for neck pain and neck stiffness.  Skin:  Negative for rash.  Neurological:  Positive for dizziness. Negative for weakness, numbness and headaches.  Psychiatric/Behavioral:  Negative for agitation and behavioral problems.       Objective:    Physical Exam Vitals reviewed.  Constitutional:      General: He is not in acute distress.  Appearance: He is not diaphoretic.  HENT:     Head: Normocephalic and atraumatic.     Nose: Nose normal.     Mouth/Throat:     Mouth: Mucous membranes are moist.  Eyes:     General: No scleral icterus.    Extraocular Movements: Extraocular movements intact.  Cardiovascular:     Rate and Rhythm: Normal rate and regular rhythm.     Pulses: Normal pulses.     Heart sounds: Normal heart sounds. No murmur heard. Pulmonary:     Breath sounds: Normal breath sounds. No wheezing or rales.  Musculoskeletal:     Cervical back: Neck supple. No tenderness.     Right lower leg: No edema.     Left lower leg: No edema.  Skin:    General: Skin is warm.     Findings: No rash.  Neurological:     General: No focal deficit present.     Mental Status: He is alert and oriented to person, place, and time.     Cranial Nerves: No cranial nerve deficit.     Sensory: No sensory deficit.     Motor: No weakness.  Psychiatric:        Mood and Affect: Mood normal.        Behavior: Behavior normal.     BP 132/68 (BP Location: Left Arm, Patient Position: Sitting, Cuff Size: Normal)   Pulse 75   Resp 18   Ht '5\' 10"'$  (1.778 m)   Wt 157 lb 6.4 oz (71.4 kg)   SpO2 96%   BMI 22.58 kg/m  Wt Readings from Last 3 Encounters:  01/17/22 157 lb 6.4 oz (71.4 kg)  09/21/21 163 lb 9.6 oz (74.2 kg)  11/11/20 175 lb (79.4 kg)    Lab Results  Component Value Date   TSH 6.540 (H) 01/17/2022   Lab Results  Component Value Date   WBC 7.5 09/21/2021   HGB 14.2 09/21/2021   HCT 42.2 09/21/2021   MCV 94 09/21/2021   PLT 208 09/21/2021    Lab Results  Component Value Date   NA 137 08/12/2020   K 4.6 08/12/2020   CO2 30 08/12/2020   GLUCOSE 98 08/12/2020   BUN 14 08/12/2020   CREATININE 1.10 08/12/2020   BILITOT 0.6 08/12/2020   AST 47 (H) 08/12/2020   ALT 45 08/12/2020   PROT 6.9 08/12/2020   CALCIUM 9.0 08/12/2020   No results found for: "CHOL" No results found for: "HDL" No results found for: "LDLCALC" No results found for: "TRIG" No results found for: "CHOLHDL" No results found for: "HGBA1C"    Assessment & Plan:   Problem List Items Addressed This Visit       Endocrine   Subclinical hypothyroidism    Lab Results  Component Value Date   TSH 6.540 (H) 01/17/2022  With normal free T4 Recheck TSH and free T4 - if persistently elevated, will start Levothyroxine      Relevant Orders   TSH + free T4 (Completed)     Musculoskeletal and Integument   Osteoarthritis - Primary    B/l hand and knee OA Started Celebrex      Relevant Medications   celecoxib (CELEBREX) 100 MG capsule     Other   Chronic fatigue    Was placed on NP thyroid and testosterone in the past by previous PCP, but he did not like it and stopped taking them Checked TSH and free T4, CBC - TSH abnormality likely due to  previous NP thyroid exposure Recent CMP reviewed - unremarkable      Benign prostatic hyperplasia with nocturia    Has tried Flomax with no relief UA reviewed On Uroxatrol - held for now due to urinary urgency concern      Urinary urgency    Unclear etiology, could be overflow incontinence from BPH or urge incontinence Started Oxybutynin for now, hold Uroxatral       Relevant Medications   oxybutynin (DITROPAN-XL) 10 MG 24 hr tablet   Bee sting allergy    EpiPen refilled       Meds ordered this encounter  Medications   celecoxib (CELEBREX) 100 MG capsule    Sig: Take 1 capsule (100 mg total) by mouth daily.    Dispense:  30 capsule    Refill:  3   oxybutynin (DITROPAN-XL) 10 MG 24 hr tablet     Sig: Take 1 tablet (10 mg total) by mouth at bedtime.    Dispense:  30 tablet    Refill:  3    Follow-up: Return in about 6 months (around 07/20/2022) for Annual physical.    Lindell Spar, MD

## 2022-01-17 NOTE — Patient Instructions (Signed)
Please take Celebrex for arthritis. Okay to apply Voltaren gel over small joints of hands.  Please take Oxybutynin for urinary urgency.  Please use Debrox ear drops for ear wax. Okay to clean with Qtip.

## 2022-01-17 NOTE — Patient Instructions (Signed)
Scott Chang , Thank you for taking time to come for your Medicare Wellness Visit. I appreciate your ongoing commitment to your health goals. Please review the following plan we discussed and let me know if I can assist you in the future.   Screening recommendations/referrals: Recommended yearly ophthalmology/optometry visit for glaucoma screening and checkup Recommended yearly dental visit for hygiene and checkup  Vaccinations: Influenza vaccine: Completed Pneumococcal vaccine: Completed Tdap vaccine: Completed Shingles vaccine: Completed    Advanced directives: bring copy to your next appt if possible  Conditions/risks identified: falls  Next appointment: 1 year  Preventive Care 39 Years and Older, Male Preventive care refers to lifestyle choices and visits with your health care provider that can promote health and wellness. What does preventive care include? A yearly physical exam. This is also called an annual well check. Dental exams once or twice a year. Routine eye exams. Ask your health care provider how often you should have your eyes checked. Personal lifestyle choices, including: Daily care of your teeth and gums. Regular physical activity. Eating a healthy diet. Avoiding tobacco and drug use. Limiting alcohol use. Practicing safe sex. Taking low doses of aspirin every day. Taking vitamin and mineral supplements as recommended by your health care provider. What happens during an annual well check? The services and screenings done by your health care provider during your annual well check will depend on your age, overall health, lifestyle risk factors, and family history of disease. Counseling  Your health care provider may ask you questions about your: Alcohol use. Tobacco use. Drug use. Emotional well-being. Home and relationship well-being. Sexual activity. Eating habits. History of falls. Memory and ability to understand (cognition). Work and work  Statistician. Screening  You may have the following tests or measurements: Height, weight, and BMI. Blood pressure. Lipid and cholesterol levels. These may be checked every 5 years, or more frequently if you are over 80 years old. Skin check. Lung cancer screening. You may have this screening every year starting at age 93 if you have a 30-pack-year history of smoking and currently smoke or have quit within the past 15 years. Fecal occult blood test (FOBT) of the stool. You may have this test every year starting at age 53. Flexible sigmoidoscopy or colonoscopy. You may have a sigmoidoscopy every 5 years or a colonoscopy every 10 years starting at age 63. Prostate cancer screening. Recommendations will vary depending on your family history and other risks. Hepatitis C blood test. Hepatitis B blood test. Sexually transmitted disease (STD) testing. Diabetes screening. This is done by checking your blood sugar (glucose) after you have not eaten for a while (fasting). You may have this done every 1-3 years. Abdominal aortic aneurysm (AAA) screening. You may need this if you are a current or former smoker. Osteoporosis. You may be screened starting at age 50 if you are at high risk. Talk with your health care provider about your test results, treatment options, and if necessary, the need for more tests. Vaccines  Your health care provider may recommend certain vaccines, such as: Influenza vaccine. This is recommended every year. Tetanus, diphtheria, and acellular pertussis (Tdap, Td) vaccine. You may need a Td booster every 10 years. Zoster vaccine. You may need this after age 8. Pneumococcal 13-valent conjugate (PCV13) vaccine. One dose is recommended after age 39. Pneumococcal polysaccharide (PPSV23) vaccine. One dose is recommended after age 68. Talk to your health care provider about which screenings and vaccines you need and how often you need  them. This information is not intended to replace  advice given to you by your health care provider. Make sure you discuss any questions you have with your health care provider. Document Released: 07/03/2015 Document Revised: 02/24/2016 Document Reviewed: 04/07/2015 Elsevier Interactive Patient Education  2017 [place] Prevention in the Home Falls can cause injuries. They can happen to people of all ages. There are many things you can do to make your home safe and to help prevent falls. What can I do on the outside of my home? Regularly fix the edges of walkways and driveways and fix any cracks. Remove anything that might make you trip as you walk through a door, such as a raised step or threshold. Trim any bushes or trees on the path to your home. Use bright outdoor lighting. Clear any walking paths of anything that might make someone trip, such as rocks or tools. Regularly check to see if handrails are loose or broken. Make sure that both sides of any steps have handrails. Any raised decks and porches should have guardrails on the edges. Have any leaves, snow, or ice cleared regularly. Use sand or salt on walking paths during winter. Clean up any spills in your garage right away. This includes oil or grease spills. What can I do in the bathroom? Use night lights. Install grab bars by the toilet and in the tub and shower. Do not use towel bars as grab bars. Use non-skid mats or decals in the tub or shower. If you need to sit down in the shower, use a plastic, non-slip stool. Keep the floor dry. Clean up any water that spills on the floor as soon as it happens. Remove soap buildup in the tub or shower regularly. Attach bath mats securely with double-sided non-slip rug tape. Do not have throw rugs and other things on the floor that can make you trip. What can I do in the bedroom? Use night lights. Make sure that you have a light by your bed that is easy to reach. Do not use any sheets or blankets that are too big for your bed.  They should not hang down onto the floor. Have a firm chair that has side arms. You can use this for support while you get dressed. Do not have throw rugs and other things on the floor that can make you trip. What can I do in the kitchen? Clean up any spills right away. Avoid walking on wet floors. Keep items that you use a lot in easy-to-reach places. If you need to reach something above you, use a strong step stool that has a grab bar. Keep electrical cords out of the way. Do not use floor polish or wax that makes floors slippery. If you must use wax, use non-skid floor wax. Do not have throw rugs and other things on the floor that can make you trip. What can I do with my stairs? Do not leave any items on the stairs. Make sure that there are handrails on both sides of the stairs and use them. Fix handrails that are broken or loose. Make sure that handrails are as long as the stairways. Check any carpeting to make sure that it is firmly attached to the stairs. Fix any carpet that is loose or worn. Avoid having throw rugs at the top or bottom of the stairs. If you do have throw rugs, attach them to the floor with carpet tape. Make sure that you have a light switch at  the top of the stairs and the bottom of the stairs. If you do not have them, ask someone to add them for you. What else can I do to help prevent falls? Wear shoes that: Do not have high heels. Have rubber bottoms. Are comfortable and fit you well. Are closed at the toe. Do not wear sandals. If you use a stepladder: Make sure that it is fully opened. Do not climb a closed stepladder. Make sure that both sides of the stepladder are locked into place. Ask someone to hold it for you, if possible. Clearly mark and make sure that you can see: Any grab bars or handrails. First and last steps. Where the edge of each step is. Use tools that help you move around (mobility aids) if they are needed. These  include: Canes. Walkers. Scooters. Crutches. Turn on the lights when you go into a dark area. Replace any light bulbs as soon as they burn out. Set up your furniture so you have a clear path. Avoid moving your furniture around. If any of your floors are uneven, fix them. If there are any pets around you, be aware of where they are. Review your medicines with your doctor. Some medicines can make you feel dizzy. This can increase your chance of falling. Ask your doctor what other things that you can do to help prevent falls. This information is not intended to replace advice given to you by your health care provider. Make sure you discuss any questions you have with your health care provider. Document Released: 04/02/2009 Document Revised: 11/12/2015 Document Reviewed: 07/11/2014 Elsevier Interactive Patient Education  2017 [employer].

## 2022-01-18 LAB — TSH+FREE T4
Free T4: 1.06 ng/dL (ref 0.82–1.77)
TSH: 6.54 u[IU]/mL — ABNORMAL HIGH (ref 0.450–4.500)

## 2022-01-19 ENCOUNTER — Other Ambulatory Visit: Payer: Self-pay | Admitting: Internal Medicine

## 2022-01-19 ENCOUNTER — Telehealth: Payer: Self-pay

## 2022-01-19 DIAGNOSIS — Z87892 Personal history of anaphylaxis: Secondary | ICD-10-CM

## 2022-01-19 MED ORDER — EPINEPHRINE 0.3 MG/0.3ML IJ SOAJ: 0.3000 mg | INTRAMUSCULAR | 1 refills | Status: AC | PRN

## 2022-01-19 NOTE — Telephone Encounter (Signed)
I will relay message of thyroid results to patient please advise on epi pen

## 2022-01-19 NOTE — Telephone Encounter (Signed)
Patient called and needs an Generic epic pen, he is allergic to bees and he forgot to mention to Dr Posey Pronto yesterday ins office.  Pharmacy: [facility]   Also patient need to know why to wait and start his thyroid medicine later?    Patient call back # [phone]

## 2022-01-19 NOTE — Telephone Encounter (Signed)
Patient advised with verbal understanding  

## 2022-01-21 DIAGNOSIS — Z91030 Bee allergy status: Secondary | ICD-10-CM

## 2022-01-21 HISTORY — DX: Bee allergy status: Z91.030

## 2022-01-21 NOTE — Assessment & Plan Note (Signed)
EpiPen refilled

## 2022-01-21 NOTE — Assessment & Plan Note (Signed)
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

## 2022-01-21 NOTE — Assessment & Plan Note (Signed)
Was placed on NP thyroid and testosterone in the past by previous PCP, but he did not like it and stopped taking them Checked TSH and free T4, CBC - TSH abnormality likely due to previous NP thyroid exposure Recent CMP reviewed - unremarkable

## 2022-01-21 NOTE — Assessment & Plan Note (Addendum)
Has tried Flomax with no relief UA reviewed On Uroxatrol - held for now due to urinary urgency concern

## 2022-01-21 NOTE — Assessment & Plan Note (Signed)
Lab Results  Component Value Date   TSH 6.540 (H) 01/17/2022   With normal free T4 Recheck TSH and free T4 - if persistently elevated, will start Levothyroxine 

## 2022-01-21 NOTE — Assessment & Plan Note (Signed)
Unclear etiology, could be overflow incontinence from BPH or urge incontinence Started Oxybutynin for now, hold Uroxatral

## 2022-01-24 ENCOUNTER — Ambulatory Visit: Payer: Medicare HMO | Admitting: Internal Medicine

## 2022-02-03 DIAGNOSIS — Z79899 Other long term (current) drug therapy: Secondary | ICD-10-CM | POA: Diagnosis not present

## 2022-02-04 ENCOUNTER — Encounter: Payer: Self-pay | Admitting: Dermatology

## 2022-02-04 NOTE — Progress Notes (Signed)
   Follow-Up Visit   Subjective  Scott Chang is a 81 y.o. male who presents for the following: Annual Exam (Patient here today for yearly skin check, per patient he's now getting little bumps that come and go on his body that itch x 3- 4 months. No personal history or family history of atypical moles, melanoma and non mole skin cancer. ).  Skin check, history of itchy bumps that come and go for several months Location:  Duration:  Quality:  Associated Signs/Symptoms: Modifying Factors:  Severity:  Timing: Context:   Objective  Well appearing patient in no apparent distress; mood and affect are within normal limits. Waist up skin examination: 1 atypical pigmented spot left cheek will be biopsied.  No other lesions suspicious for melanoma or nonmelanoma skin cancer.  Patient describes scattered itchy bumps that wax and wane.  No typical lesions present today but history could fit a variant of urticaria including cholinergic (no clear-cut trigger).  Left Buccal Cheek Irregular 5 mm pigmented macule, dermoscopy shows no pseudocysts but likely not melanocytic neoplasm.       All skin waist up examined.   Assessment & Plan    Neoplasm of uncertain behavior of skin Left Buccal Cheek  Skin / nail biopsy Type of biopsy: tangential   Informed consent: discussed and consent obtained   Timeout: patient name, date of birth, surgical site, and procedure verified   Anesthesia: the lesion was anesthetized in a standard fashion   Anesthetic:  1% lidocaine w/ epinephrine 1-100,000 local infiltration Instrument used: flexible razor blade   Hemostasis achieved with: aluminum chloride and electrodesiccation   Outcome: patient tolerated procedure well   Post-procedure details: wound care instructions given    Specimen 1 - Surgical pathology Differential Diagnosis: atypia mm  STAT Check Margins: No  Screening exam for skin cancer  Annual skin  examination.  Urticaria  Treatment initiated but will recheck if condition worsens.      I, Lavonna Monarch, MD, have reviewed all documentation for this visit.  The documentation on 02/04/22 for the exam, diagnosis, procedures, and orders are all accurate and complete.

## 2022-03-22 ENCOUNTER — Other Ambulatory Visit: Payer: Self-pay | Admitting: Internal Medicine

## 2022-03-22 DIAGNOSIS — N401 Enlarged prostate with lower urinary tract symptoms: Secondary | ICD-10-CM

## 2022-04-25 ENCOUNTER — Ambulatory Visit (INDEPENDENT_AMBULATORY_CARE_PROVIDER_SITE_OTHER): Payer: Medicare HMO | Admitting: Internal Medicine

## 2022-04-25 ENCOUNTER — Encounter: Payer: Self-pay | Admitting: Internal Medicine

## 2022-04-25 VITALS — BP 122/72 | HR 74 | Ht 70.0 in | Wt 159.2 lb

## 2022-04-25 DIAGNOSIS — G8929 Other chronic pain: Secondary | ICD-10-CM | POA: Diagnosis not present

## 2022-04-25 DIAGNOSIS — N401 Enlarged prostate with lower urinary tract symptoms: Secondary | ICD-10-CM

## 2022-04-25 DIAGNOSIS — H9203 Otalgia, bilateral: Secondary | ICD-10-CM | POA: Diagnosis not present

## 2022-04-25 DIAGNOSIS — L570 Actinic keratosis: Secondary | ICD-10-CM | POA: Diagnosis not present

## 2022-04-25 DIAGNOSIS — R3915 Urgency of urination: Secondary | ICD-10-CM

## 2022-04-25 DIAGNOSIS — L299 Pruritus, unspecified: Secondary | ICD-10-CM

## 2022-04-25 DIAGNOSIS — R351 Nocturia: Secondary | ICD-10-CM | POA: Diagnosis not present

## 2022-04-25 DIAGNOSIS — R4181 Age-related cognitive decline: Secondary | ICD-10-CM | POA: Diagnosis not present

## 2022-04-25 HISTORY — DX: Other chronic pain: G89.29

## 2022-04-25 HISTORY — DX: Actinic keratosis: L57.0

## 2022-04-25 MED ORDER — CLOBETASOL PROPIONATE 0.05 % EX CREA: 1.0000 | TOPICAL_CREAM | Freq: Two times a day (BID) | CUTANEOUS | 0 refills | Status: DC

## 2022-04-25 MED ORDER — ALFUZOSIN HCL ER 10 MG PO TB24: ORAL_TABLET | ORAL | 3 refills | Status: DC

## 2022-04-25 NOTE — Progress Notes (Signed)
Established Patient Office Visit  Subjective:  Patient ID: Scott Chang, male    DOB: 02/10/1941  Age: 81 y.o. MRN: [id number]  CC:  Chief Complaint  Patient presents with   Follow-up    Requesting referral to ENT Dr.Teoh due to itching in ears with a film patient states the film builds up within 3-4 hours of cleaning.rash with bumps that itch all over     HPI Scott Chang is a 81 y.o. male with past medical history of OA, DDD of lumbar spine and BPPV who presents for c/o ear itching and rash on his UE.  Ear itching/discomfort: He has chronic, intermittent ear itching/discomfort.  He has tried using Debrox eardrops without much relief.  His ear exam did not show impacted earwax today.  Denies any ear discharge.  Denies any nasal congestion or postnasal drip.  He requests referral to ENT specialist.  Urinary urgency: He was placed on oxybutynin in the last visit, but has not noticed any difference in his urinary urgency.  He used to take Uroxatral for BPH, and requests a refill of it.  He currently denies any dysuria or hematuria.  Memory problem: He c/o difficulty remembering even recent things.  His MMSE was 25/30.  He currently lives alone and is independent for ADLs.  Denies any episode of confusion or agitation, but feels frustrated with memory concern.  He also reports recurrent rash on his UE and LE, which is intensely itching.  He has tried clobetasol cream in the past with some relief.  Denies any recent insect bite or any new chemical exposure.     Past Medical History:  Diagnosis Date   Anxiety    Arthritis    Colon polyps    Dyspnea on exertion 04/18/2019   HLD (hyperlipidemia) 04/18/2019   Hypercholesteremia    Malaise and fatigue 04/18/2019   Neuropathy     Past Surgical History:  Procedure Laterality Date   APPENDECTOMY     CHOLECYSTECTOMY     COLONOSCOPY N/A 05/09/2013   Procedure: COLONOSCOPY;  Surgeon: Rogene Houston, MD;  Location: [facility];   Service: Endoscopy;  Laterality: N/A;  63   COLONOSCOPY W/ BIOPSIES AND POLYPECTOMY     Left knee arthroscopy     Left shoulder arthroscopy     X 2    Family History  Problem Relation Age of Onset   Chronic Renal Failure Mother    Heart attack Father    Cancer Brother    Osteoarthritis Daughter    Colon cancer Neg Hx     Social History   Socioeconomic History   Marital status: Married    Spouse name: Not on file   Number of children: Not on file   Years of education: Not on file   Highest education level: Not on file  Occupational History   Not on file  Tobacco Use   Smoking status: Former    Packs/day: 1.00    Years: 40.00    Total pack years: 40.00    Types: Cigarettes    Quit date: 06/21/1988    Years since quitting: 33.8   Smokeless tobacco: Never  Vaping Use   Vaping Use: Never used  Substance and Sexual Activity   Alcohol use: Not Currently   Drug use: No   Sexual activity: Not on file  Other Topics Concern   Not on file  Social History Narrative   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   Social  Determinants of Health   Financial Resource Strain: Low Risk  (01/17/2022)   Overall Financial Resource Strain (CARDIA)    Difficulty of Paying Living Expenses: Not hard at all  Food Insecurity: No Food Insecurity (01/17/2022)   Hunger Vital Sign    Worried About Running Out of Food in the Last Year: Never true    Ran Out of Food in the Last Year: Never true  Transportation Needs: No Transportation Needs (01/17/2022)   PRAPARE - Hydrologist (Medical): No    Lack of Transportation (Non-Medical): No  Physical Activity: Sufficiently Active (01/17/2022)   Exercise Vital Sign    Days of Exercise per Week: 5 days    Minutes of Exercise per Session: 140 min  Stress: Stress Concern Present (01/17/2022)   [place]    Feeling of Stress : To some extent   Social Connections: Socially Isolated (01/17/2022)   Social Connection and Isolation Panel [NHANES]    Frequency of Communication with Friends and Family: More than three times a week    Frequency of Social Gatherings with Friends and Family: More than three times a week    Attends Religious Services: Never    Marine scientist or Organizations: No    Attends Archivist Meetings: Never    Marital Status: Widowed  Intimate Partner Violence: Not At Risk (01/17/2022)   Humiliation, Afraid, Rape, and Kick questionnaire    Fear of Current or Ex-Partner: No    Emotionally Abused: No    Physically Abused: No    Sexually Abused: No    Outpatient Medications Prior to Visit  Medication Sig Dispense Refill   celecoxib (CELEBREX) 100 MG capsule Take 1 capsule (100 mg total) by mouth daily. 30 capsule 3   EPINEPHrine 0.3 mg/0.3 mL IJ SOAJ injection Inject 0.3 mg into the muscle as needed for anaphylaxis. 1 each 1   gabapentin (NEURONTIN) 100 MG capsule TAKE 1 CAPSULE BY MOUTH THREE TIMES A DAY. 90 capsule 3   meclizine (ANTIVERT) 25 MG tablet Take 1 tablet (25 mg total) by mouth 2 (two) times daily as needed for dizziness. 30 tablet 1   Multiple Vitamin (MULTIVITAMIN WITH MINERALS) TABS tablet Take 1 tablet by mouth daily.     prednisoLONE acetate (PRED FORTE) 1 % ophthalmic suspension SMARTSIG:In Eye(s)     alfuzosin (UROXATRAL) 10 MG 24 hr tablet TAKE 1 TABLET BY MOUTHONCE A DAY WITH BREAKFAST. 30 tablet 0   oxybutynin (DITROPAN-XL) 10 MG 24 hr tablet Take 1 tablet (10 mg total) by mouth at bedtime. 30 tablet 3   Polyethyl Glycol-Propyl Glycol (SYSTANE OP) Place 1 drop into both eyes daily as needed (Dry Eyes).     No facility-administered medications prior to visit.    Allergies  Allergen Reactions   Bee Venom Anaphylaxis   Penicillins Itching and Rash    ROS Review of Systems  Constitutional:  Positive for fatigue. Negative for chills and fever.  HENT:  Negative for  congestion and sore throat.        B/l ear discomfort  Eyes:  Negative for pain and discharge.  Respiratory:  Negative for cough and shortness of breath.   Cardiovascular:  Negative for chest pain and palpitations.  Gastrointestinal:  Negative for diarrhea, nausea and vomiting.  Endocrine: Negative for polydipsia and polyuria.  Genitourinary:  Positive for urgency. Negative for dysuria and hematuria.       Nocturia  Musculoskeletal:  Negative for neck pain and neck stiffness.  Skin:  Positive for rash.  Neurological:  Positive for dizziness. Negative for weakness, numbness and headaches.  Psychiatric/Behavioral:  Negative for agitation and behavioral problems.       Objective:    Physical Exam Vitals reviewed.  Constitutional:      General: He is not in acute distress.    Appearance: He is not diaphoretic.  HENT:     Head: Normocephalic and atraumatic.     Right Ear: Ear canal and external ear normal. There is no impacted cerumen.     Left Ear: Ear canal and external ear normal. There is no impacted cerumen.     Nose: Nose normal.     Mouth/Throat:     Mouth: Mucous membranes are moist.  Eyes:     General: No scleral icterus.    Extraocular Movements: Extraocular movements intact.  Cardiovascular:     Rate and Rhythm: Normal rate and regular rhythm.     Pulses: Normal pulses.     Heart sounds: Normal heart sounds. No murmur heard. Pulmonary:     Breath sounds: Normal breath sounds. No wheezing or rales.  Musculoskeletal:     Cervical back: Neck supple. No tenderness.     Right lower leg: No edema.     Left lower leg: No edema.  Skin:    General: Skin is warm.     Findings: Lesion (Actinic keratoses - over right arm near elbow and hip area) present. No rash.  Neurological:     General: No focal deficit present.     Mental Status: He is alert and oriented to person, place, and time.     Cranial Nerves: No cranial nerve deficit.     Sensory: No sensory deficit.      Motor: No weakness.  Psychiatric:        Mood and Affect: Mood normal.        Behavior: Behavior normal.     BP 122/72 (BP Location: Left Arm, Patient Position: Sitting, Cuff Size: Normal)   Pulse 74   Ht '5\' 10"'$  (1.778 m)   Wt 159 lb 3.2 oz (72.2 kg)   SpO2 97%   BMI 22.84 kg/m  Wt Readings from Last 3 Encounters:  04/25/22 159 lb 3.2 oz (72.2 kg)  01/17/22 157 lb 6.4 oz (71.4 kg)  09/21/21 163 lb 9.6 oz (74.2 kg)    Lab Results  Component Value Date   TSH 6.540 (H) 01/17/2022   Lab Results  Component Value Date   WBC 7.5 09/21/2021   HGB 14.2 09/21/2021   HCT 42.2 09/21/2021   MCV 94 09/21/2021   PLT 208 09/21/2021   Lab Results  Component Value Date   NA 137 08/12/2020   K 4.6 08/12/2020   CO2 30 08/12/2020   GLUCOSE 98 08/12/2020   BUN 14 08/12/2020   CREATININE 1.10 08/12/2020   BILITOT 0.6 08/12/2020   AST 47 (H) 08/12/2020   ALT 45 08/12/2020   PROT 6.9 08/12/2020   CALCIUM 9.0 08/12/2020   No results found for: "CHOL" No results found for: "HDL" No results found for: "LDLCALC" No results found for: "TRIG" No results found for: "CHOLHDL" No results found for: "HGBA1C"    Assessment & Plan:   Problem List Items Addressed This Visit       Musculoskeletal and Integument   Actinic keratosis    Skin lesions likely actinic keratoses Clobetasol cream for itching  Relevant Medications   clobetasol cream (TEMOVATE) 0.05 %     Other   Benign prostatic hyperplasia with nocturia    Has tried Flomax with no relief UA reviewed On Uroxatrol, refilled      Relevant Medications   alfuzosin (UROXATRAL) 10 MG 24 hr tablet   Other Relevant Orders   Ambulatory referral to Urology   Urinary urgency    Unclear etiology, could be overflow incontinence from BPH or urge incontinence DC Oxybutynin as there was no difference in symptoms Referred to Urology      Relevant Orders   Ambulatory referral to Urology   Chronic ear pain, bilateral - Primary     Ear discomfort and itching He has tried Debrox eardrops Ear exam benign today Referred to ENT specialist as he has chronic ear discomfort      Relevant Orders   Ambulatory referral to ENT   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    Dispense:  30 g    Refill:  0   alfuzosin (UROXATRAL) 10 MG 24 hr tablet    Sig: TAKE 1 TABLET BY MOUTHONCE A DAY WITH BREAKFAST.    Dispense:  90 tablet    Refill:  3    Follow-up: Return if symptoms worsen or fail to improve.    Lindell Spar, MD

## 2022-04-25 NOTE — Assessment & Plan Note (Signed)
Ear discomfort and itching He has tried Debrox eardrops Ear exam benign today Referred to ENT specialist as he has chronic ear discomfort

## 2022-04-25 NOTE — Assessment & Plan Note (Signed)
Has tried Flomax with no relief UA reviewed On Uroxatrol, refilled

## 2022-04-25 NOTE — Assessment & Plan Note (Signed)
Unclear etiology, could be overflow incontinence from BPH or urge incontinence DC Oxybutynin as there was no difference in symptoms Referred to Urology

## 2022-04-25 NOTE — Assessment & Plan Note (Signed)
Skin lesions likely actinic keratoses Clobetasol cream for itching

## 2022-04-25 NOTE — Patient Instructions (Signed)
Please stop taking Oxybutynin. Please continue taking Alfuzosin.  Please continue taking other medications as prescribed.  You are being referred to Dr Benjamine Mola for ear itching and pain.

## 2022-04-25 NOTE — Assessment & Plan Note (Signed)
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

## 2022-04-26 ENCOUNTER — Encounter: Payer: Self-pay | Admitting: Physician Assistant

## 2022-05-02 ENCOUNTER — Other Ambulatory Visit (INDEPENDENT_AMBULATORY_CARE_PROVIDER_SITE_OTHER): Payer: Medicare HMO

## 2022-05-02 ENCOUNTER — Encounter: Payer: Self-pay | Admitting: Physician Assistant

## 2022-05-02 ENCOUNTER — Ambulatory Visit: Payer: Medicare HMO | Admitting: Physician Assistant

## 2022-05-02 VITALS — BP 151/79 | HR 76 | Ht 70.0 in | Wt 154.2 lb

## 2022-05-02 DIAGNOSIS — G309 Alzheimer's disease, unspecified: Secondary | ICD-10-CM | POA: Diagnosis not present

## 2022-05-02 DIAGNOSIS — R413 Other amnesia: Secondary | ICD-10-CM

## 2022-05-02 DIAGNOSIS — F0280 Dementia in other diseases classified elsewhere without behavioral disturbance: Secondary | ICD-10-CM | POA: Diagnosis not present

## 2022-05-02 LAB — VITAMIN B12: Vitamin B-12: 539 pg/mL (ref 211–911)

## 2022-05-02 MED ORDER — DONEPEZIL HCL 5 MG PO TABS: 5.0000 mg | ORAL_TABLET | Freq: Every day | ORAL | 11 refills | Status: DC

## 2022-05-02 NOTE — Progress Notes (Cosign Needed)
Assessment/Plan:    The patient is seen in neurologic consultation at the request of Lindell Spar, MD for the evaluation of memory.  Scott Chang is a very pleasant 81 y.o. year old RH male with  a history of  hyperlipidemia,Vit D deficiency,  OA/DDD of the L spine, peripheral neuropathy, history of BPPV seen today for evaluation of memory loss. MoCA today is 17 /30 . He is able to perform his ADLs well.  Personally reviewed MRI from 2022 is remarkable for atrophy and mild small vessel disease. 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 Continue to control mood and sleep meds as per PCP Recommend good control of cardiovascular risk factors.   Folllow up  in 1 month   Subjective:    The patient is accompanied by his daughter who supplements the history.    How long did patient have memory difficulties? For the last  3-4 years, when he began to  notice difficulty remembering recent conversations and names of people. Initially this was attributed to age but since his wife death 1 year ago, his memory worsened and now he is "not sure if grief contributed to memory or not, but it is worse"-he says. . Able to do ADLs and likes to work on his vehicles an does acres or yard work. He does not like to do any crossword puzzles or word finding. repeats oneself?" Not so much of that " Disoriented when walking into a room?  Patient denies, but at times he forgets what he came to the room for Leaving objects in unusual places?  Patient denies, but does  forget where he left common objects such as remote, keys and phone.   Patient lives  alone since his wife died on 2021-05-12. Ambulates  with difficulty?   Patient denies   Recent falls?  Patient denies   Any head injuries?  Patient denies   History of seizures?   Patient denies   Wandering behavior?  Patient denies   Patient drives?  No issues. "I still do a great job".  Any mood changes ? He denies Any depression?:  Patient denies a history of, but his wife died 1 year ago and at times he feels sad, he is dealing with grief.  Hallucinations?  Patient denies   Paranoia?  Patient denies   Denies vivid dreams, REM behavior or sleepwalking. He has insomnia, does not feel rested in most instances .    History of sleep apnea?  Patient denies   Any hygiene concerns?  Patient denies   Independent of bathing and dressing?  Endorsed  Does the patient needs help with medications? Patient in charge  Occasionally misses doses  Who is in charge of the finances?  Patient  is in charge   Any changes in appetite?  Patient denies   Trying to eliminate some foods of the diet due to skin rashes followed by Dermatology Patient have trouble swallowing? Patient denies   Does the patient cook?  Patient denies   Any kitchen accidents such as leaving the stove on? Patient denies  Any headaches?  Patient denies   Double vision? Patient denies   Any focal numbness or tingling?  Patient denies   Chronic back pain Patient denies   Unilateral weakness?  Patient denies   Any tremors?  Patient denies   Any history of anosmia?  Patient denies   Any incontinence of urine? Urgency incontinence and BPH, on Ditropan Any bowel dysfunction?   Patient denies   History of heavy alcohol intake?  Patient denies   History of heavy tobacco use?  Patient denies   Family history of dementia?   Unknown, parents died very young. 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   meclizine (ANTIVERT) 25 mg, Oral, 2 times daily PRN   Multiple Vitamin (MULTIVITAMIN WITH MINERALS) TABS tablet 1 tablet, Daily   Propylene Glycol (SYSTANE COMPLETE OP) Ophthalmic     VITALS:   Vitals:   05/02/22 0745  BP: (!) 151/79  Pulse: 76  SpO2: 100%  Weight: 154 lb 3.2 oz (69.9 kg)  Height: '5\' 10"'$  (1.778 m)      04/25/2022    3:03 PM 01/17/2022    9:22 AM 01/17/2022    9:19 AM 01/17/2022    8:31 AM 09/21/2021    9:51 AM  Depression screen PHQ 2/9  Decreased Interest 0 0 0 0 0  Down, Depressed, Hopeless 0 0 0 0 0  PHQ - 2 Score 0 0 0 0 0    PHYSICAL EXAM   HEENT:  Normocephalic, atraumatic. The mucous membranes are moist. The superficial temporal arteries are without ropiness or tenderness. Cardiovascular: Regular rate and rhythm. Lungs: Clear to auscultation bilaterally. Neck: There are no carotid bruits noted bilaterally.  NEUROLOGICAL:    05/02/2022    8:00 AM  Montreal Cognitive Assessment   Visuospatial/ Executive (0/5) 1  Naming (0/3) 3  Attention: Read list of digits (0/2) 2  Attention: Read list of letters (0/1) 1  Attention: Serial 7 subtraction starting at 100 (0/3) 1  Language: Repeat phrase (0/2) 2  Language : Fluency (0/1) 0  Abstraction (0/2) 1  Delayed Recall (0/5) 0  Orientation (0/6) 6  Total 17  Adjusted Score (based on education) 17        No data to display           Orientation:  Alert and oriented to  person, place and time. No aphasia or dysarthria. Fund of knowledge is appropriate. Recent and remote memory is impaired  Attention and concentration are normal.  Able to name objects and repeat phrases. Delayed recall 0/6  Cranial nerves: There is good facial symmetry. Extraocular muscles are intact and visual fields are full to confrontational testing. Speech is fluent and clear. Soft palate rises symmetrically and there is no tongue deviation. Hearing is intact to conversational tone. Tone: Tone is good throughout. Sensation: Sensation is intact to light touch and pinprick throughout. Vibration is intact at  the bilateral big toe.There is no extinction with double simultaneous stimulation. There is no sensory dermatomal level identified. Coordination: The patient has no difficulty with RAM's or FNF bilaterally. Normal finger to nose  Motor: Strength is 5/5 in the bilateral upper and lower extremities. There is no pronator drift. There are no fasciculations noted. DTR's: Deep tendon reflexes are 1/4 at the bilateral biceps, triceps, brachioradialis, patella and achilles.  Plantar responses are downgoing bilaterally. Gait and Station: The patient is able to ambulate without difficulty.The patient is able to ambulate in a tandem fashion, able to stand in the Romberg position.     Thank you for allowing [place] the opportunity to participate in the care of this nice patient. Please do not hesitate to contact us for any questions or concerns.   Total time spent on today's visit was 51 minutes dedicated to this patient today, preparing to see patient, examining the patient, ordering tests and/or medications and counseling the patient, documenting clinical information in the EHR or other health record, independently interpreting results and communicating results to the patient/family, discussing treatment and goals, answering patient's questions and coordinating care.  Cc:  Lindell Spar, MD  Sharene Butters 05/02/2022 8:40 AM

## 2022-05-02 NOTE — Patient Instructions (Addendum)
It was a pleasure to see you today at our office.   Recommendations:  Follow up in  1 months We will start donepezil half tablet ('5mg'$ ) daily   Side effects include nausea, vomiting, diarrhea, vivid dreams, and muscle cramps.    MRI brain Check lab today Neuropsych evaluation  Memory  decline, memory medications: Call our office [phone]   For psychiatric meds, mood meds: Please have your primary care physician manage these medications.   Counseling regarding caregiver distress, including caregiver depression, anxiety and issues regarding community resources, adult day care programs, adult living facilities, or memory care questions:   Feel free to contact [place], Social Worker at [phone]   For assessment of decision of mental capacity and competency:  Call Dr. Anthoney Harada, geriatric psychiatrist at [phone]  For guidance in geriatric dementia issues please call [facility] [phone]  For guidance regarding [facility] and if placement were needed at the facility, contact Arnell Asal, Social Worker tel: [phone]  If you have any severe symptoms of a stroke, or other severe issues such as confusion,severe chills or fever, etc call 911 or go to the ER as you may need to be evaluated further        RECOMMENDATIONS FOR ALL PATIENTS WITH MEMORY PROBLEMS: 1. Continue to exercise (Recommend 30 minutes of walking everyday, or 3 hours every week) 2. Increase social interactions - continue going to [place] and enjoy social gatherings with friends and family 3. Eat healthy, avoid fried foods and eat more fruits and vegetables 4. Maintain adequate blood pressure, blood sugar, and blood cholesterol level. Reducing the risk of stroke and cardiovascular disease also helps promoting better memory. 5. Avoid stressful situations. Live a simple life and avoid aggravations. Organize your time and prepare for the next day in  anticipation. 6. Sleep well, avoid any interruptions of sleep and avoid any distractions in the bedroom that may interfere with adequate sleep quality 7. Avoid sugar, avoid sweets as there is a strong link between excessive sugar intake, diabetes, and cognitive impairment We discussed the Mediterranean diet, which has been shown to help patients reduce the risk of progressive memory disorders and reduces cardiovascular risk. This includes eating fish, eat fruits and green leafy vegetables, nuts like almonds and hazelnuts, walnuts, and also use olive oil. Avoid fast foods and fried foods as much as possible. Avoid sweets and sugar as sugar use has been linked to worsening of memory function.  There is always a concern of gradual progression of memory problems. If this is the case, then we may need to adjust level of care according to patient needs. Support, both to the patient and caregiver, should then be put into place.    The Alzheimer's Association is here all day, every day for people facing Alzheimer's disease through our free 24/7 Helpline: [phone]. The Helpline provides reliable information and support to all those who need assistance, such as individuals living with memory loss, Alzheimer's or other dementia, caregivers, health care professionals and the public.  Our highly trained and knowledgeable staff can help you with: Understanding memory loss, dementia and Alzheimer's  Medications and other treatment options  General information about aging and brain health  Skills to provide quality care and to find the best care from professionals  Legal, financial and living-arrangement decisions Our Helpline also features: Confidential care consultation provided by master's level clinicians who can help with decision-making support, crisis assistance and education on issues families face every  day  Help in a caller's preferred language using our translation service that features more than 200  languages and dialects  Referrals to local community programs, services and ongoing support     FALL PRECAUTIONS: Be cautious when walking. Scan the area for obstacles that may increase the risk of trips and falls. When getting up in the mornings, sit up at the edge of the bed for a few minutes before getting out of bed. Consider elevating the bed at the head end to avoid drop of blood pressure when getting up. Walk always in a well-lit room (use night lights in the walls). Avoid area rugs or power cords from appliances in the middle of the walkways. Use a walker or a cane if necessary and consider physical therapy for balance exercise. Get your eyesight checked regularly.  FINANCIAL OVERSIGHT: Supervision, especially oversight when making financial decisions or transactions is also recommended.  HOME SAFETY: Consider the safety of the kitchen when operating appliances like stoves, microwave oven, and blender. Consider having supervision and share cooking responsibilities until no longer able to participate in those. Accidents with firearms and other hazards in the house should be identified and addressed as well.   ABILITY TO BE LEFT ALONE: If patient is unable to contact 911 operator, consider using LifeLine, or when the need is there, arrange for someone to stay with patients. Smoking is a fire hazard, consider supervision or cessation. Risk of wandering should be assessed by caregiver and if detected at any point, supervision and safe proof recommendations should be instituted.  MEDICATION SUPERVISION: Inability to self-administer medication needs to be constantly addressed. Implement a mechanism to ensure safe administration of the medications.   DRIVING: Regarding driving, in patients with progressive memory problems, driving will be impaired. We advise to have someone else do the driving if trouble finding directions or if minor accidents are reported. Independent driving assessment is  available to determine safety of driving.   If you are interested in the driving assessment, you can contact the following:  [employer] in [place]  [place] [phone] or [phone]      [place] refers to food and lifestyle choices that are based on the traditions of countries located on the [employer]. This way of eating has been shown to help prevent certain conditions and improve outcomes for people who have chronic diseases, like kidney disease and heart disease. What are tips for following this plan? Lifestyle  Cook and eat meals together with your family, when possible. Drink enough fluid to keep your urine clear or pale yellow. Be physically active every day. This includes: Aerobic exercise like running or swimming. Leisure activities like gardening, walking, or housework. Get 7-8 hours of sleep each night. If recommended by your health care provider, drink red wine in moderation. This means 1 glass a day for nonpregnant women and 2 glasses a day for men. A glass of wine equals 5 oz (150 mL). Reading food labels  Check the serving size of packaged foods. For foods such as rice and pasta, the serving size refers to the amount of cooked product, not dry. Check the total fat in packaged foods. Avoid foods that have saturated fat or trans fats. Check the ingredients list for added sugars, such as corn syrup. Shopping  At the grocery store, buy most of your food from the areas near the walls of the store.  This includes: Fresh fruits and vegetables (produce). Grains, beans, nuts, and seeds. Some of these may be available in unpackaged forms or large amounts (in bulk). Fresh seafood. Poultry and eggs. Low-fat dairy products. Buy whole ingredients instead of prepackaged foods. Buy fresh fruits and vegetables in-season  from local farmers markets. Buy frozen fruits and vegetables in resealable bags. If you do not have access to quality fresh seafood, buy precooked frozen shrimp or canned fish, such as tuna, salmon, or sardines. Buy small amounts of raw or cooked vegetables, salads, or olives from the deli or salad bar at your store. Stock your pantry so you always have certain foods on hand, such as olive oil, canned tuna, canned tomatoes, rice, pasta, and beans. Cooking  Cook foods with extra-virgin olive oil instead of using butter or other vegetable oils. Have meat as a side dish, and have vegetables or grains as your main dish. This means having meat in small portions or adding small amounts of meat to foods like pasta or stew. Use beans or vegetables instead of meat in common dishes like chili or lasagna. Experiment with different cooking methods. Try roasting or broiling vegetables instead of steaming or sauteing them. Add frozen vegetables to soups, stews, pasta, or rice. Add nuts or seeds for added healthy fat at each meal. You can add these to yogurt, salads, or vegetable dishes. Marinate fish or vegetables using olive oil, lemon juice, garlic, and fresh herbs. Meal planning  Plan to eat 1 vegetarian meal one day each week. Try to work up to 2 vegetarian meals, if possible. Eat seafood 2 or more times a week. Have healthy snacks readily available, such as: Vegetable sticks with hummus. Greek yogurt. Fruit and nut trail mix. Eat balanced meals throughout the week. This includes: Fruit: 2-3 servings a day Vegetables: 4-5 servings a day Low-fat dairy: 2 servings a day Fish, poultry, or lean meat: 1 serving a day Beans and legumes: 2 or more servings a week Nuts and seeds: 1-2 servings a day Whole grains: 6-8 servings a day Extra-virgin olive oil: 3-4 servings a day Limit red meat and sweets to only a few servings a month What are my food choices? Mediterranean diet Recommended Grains:  Whole-grain pasta. Brown rice. Bulgar wheat. Polenta. Couscous. Whole-wheat bread. [place]. Vegetables: Artichokes. Beets. Broccoli. Cabbage. Carrots. Eggplant. Green beans. Chard. Kale. Spinach. Onions. Leeks. Peas. Squash. Tomatoes. Peppers. Radishes. Fruits: Apples. Apricots. Avocado. Berries. Bananas. Cherries. Dates. Figs. Grapes. Lemons. Melon. Oranges. Peaches. Plums. Pomegranate. Meats and other protein foods: Beans. Almonds. Sunflower seeds. Pine nuts. Peanuts. [place]. Salmon. Scallops. Shrimp. [place]. Tilapia. Clams. Oysters. Eggs. Dairy: Low-fat milk. Cheese. Greek yogurt. Beverages: Water. Red wine. Herbal tea. Fats and oils: Extra virgin olive oil. Avocado oil. Grape seed oil. Sweets and desserts: [place] yogurt with honey. Baked apples. Poached pears. Trail mix. Seasoning and other foods: Basil. Cilantro. Coriander. Cumin. Mint. Parsley. Sage. Rosemary. Tarragon. Garlic. Oregano. Thyme. Pepper. Balsalmic vinegar. Tahini. Hummus. Tomato sauce. Olives. Mushrooms. Limit these Grains: Prepackaged pasta or rice dishes. Prepackaged cereal with added sugar. Vegetables: Deep fried potatoes (french fries). Fruits: Fruit canned in syrup. Meats and other protein foods: Beef. Pork. Lamb. Poultry with skin. Hot dogs. [place] Salines. Dairy: Ice cream. Sour cream. Whole milk. Beverages: Juice. Sugar-sweetened soft drinks. Beer. Liquor and spirits. Fats and oils: Butter. Canola oil. Vegetable oil. Beef fat (tallow). Lard. Sweets and desserts: Cookies. Cakes. Pies. Candy. Seasoning and other foods: Mayonnaise. Premade sauces and marinades. The items listed may not be  a complete list. Talk with your dietitian about what dietary choices are right for you. Summary The Mediterranean diet includes both food and lifestyle choices. Eat a variety of fresh fruits and vegetables, beans, nuts, seeds, and whole grains. Limit the amount of red meat and sweets that you eat. Talk with your health care provider about  whether it is safe for you to drink red wine in moderation. This means 1 glass a day for nonpregnant women and 2 glasses a day for men. A glass of wine equals 5 oz (150 mL). This information is not intended to replace advice given to you by your health care provider. Make sure you discuss any questions you have with your health care provider. Document Released: 01/28/2016 Document Revised: 03/01/2016 Document Reviewed: 01/28/2016 Elsevier Interactive Patient Education  2017 [employer].

## 2022-05-02 NOTE — Progress Notes (Signed)
B12 is normal, thanks

## 2022-05-06 ENCOUNTER — Telehealth: Payer: Self-pay | Admitting: Anesthesiology

## 2022-05-06 NOTE — Telephone Encounter (Signed)
No answer at 1241 pm 05/06/2022

## 2022-05-06 NOTE — Telephone Encounter (Signed)
Left detailed message to call if you have any quesitons.

## 2022-05-06 NOTE — Telephone Encounter (Signed)
Pt's daughter called stating that patient has not been able to sleep since starting taking Aricept on 05/02/22. Whenever he dozes off for a few minutes he wakes up feeling nauseated and dizzy. Pt's daughter would like to ask if there is something that can be prescribed or something OTC that he can take to help him sleep.

## 2022-05-09 ENCOUNTER — Encounter: Payer: Self-pay | Admitting: Physician Assistant

## 2022-05-09 NOTE — Telephone Encounter (Signed)
Ok to take it in the morning, thanks

## 2022-05-18 ENCOUNTER — Ambulatory Visit: Payer: Medicare HMO | Admitting: Urology

## 2022-05-23 DIAGNOSIS — L308 Other specified dermatitis: Secondary | ICD-10-CM | POA: Diagnosis not present

## 2022-05-27 ENCOUNTER — Ambulatory Visit
Admission: RE | Admit: 2022-05-27 | Discharge: 2022-05-27 | Disposition: A | Discharge status: Home / Self Care | Payer: Medicare HMO | Source: Ambulatory Visit | Attending: Physician Assistant | Admitting: Physician Assistant

## 2022-05-27 DIAGNOSIS — R413 Other amnesia: Secondary | ICD-10-CM

## 2022-05-27 DIAGNOSIS — I6782 Cerebral ischemia: Secondary | ICD-10-CM | POA: Diagnosis not present

## 2022-05-30 NOTE — Progress Notes (Signed)
MRI brain shows mild hardening of the arteries, age related  and the brain volume is normal for age thanks

## 2022-06-01 ENCOUNTER — Ambulatory Visit: Payer: Medicare HMO | Admitting: Urology

## 2022-06-01 ENCOUNTER — Encounter: Payer: Self-pay | Admitting: Urology

## 2022-06-01 VITALS — BP 132/74 | HR 80

## 2022-06-01 DIAGNOSIS — R3915 Urgency of urination: Secondary | ICD-10-CM | POA: Diagnosis not present

## 2022-06-01 DIAGNOSIS — R351 Nocturia: Secondary | ICD-10-CM | POA: Diagnosis not present

## 2022-06-01 DIAGNOSIS — N401 Enlarged prostate with lower urinary tract symptoms: Secondary | ICD-10-CM | POA: Diagnosis not present

## 2022-06-01 DIAGNOSIS — N138 Other obstructive and reflux uropathy: Secondary | ICD-10-CM

## 2022-06-01 MED ORDER — TAMSULOSIN HCL 0.4 MG PO CAPS: 0.4000 mg | ORAL_CAPSULE | Freq: Every day | ORAL | 11 refills | Status: DC

## 2022-06-01 NOTE — Patient Instructions (Signed)

## 2022-06-01 NOTE — Progress Notes (Signed)
post void residual=14

## 2022-06-01 NOTE — Progress Notes (Signed)
06/01/2022 11:36 AM   Scott Chang 01/01/1941 [id number]  Referring provider: Lindell Spar, MD [address] [place],  [place] [address]  No chief complaint on file.   HPI: Mr Candy is a 81yo here for evaluation of urinary urgency. Starting 8 months ago he developed urinary urgency and frequency. Nocturia 2-3x. He was tried on uroxatral '10mg'$  which failed to improve his urinary urgency or frequency.  IPSS 14 QOl 5. No hematuria or dysuria.   PMH: Past Medical History:  Diagnosis Date   Anxiety    Arthritis    Colon polyps    Dyspnea on exertion 04/18/2019   HLD (hyperlipidemia) 04/18/2019   Hypercholesteremia    Malaise and fatigue 04/18/2019   Neuropathy     Surgical History: Past Surgical History:  Procedure Laterality Date   APPENDECTOMY     CHOLECYSTECTOMY     COLONOSCOPY N/A 05/09/2013   Procedure: COLONOSCOPY;  Surgeon: Rogene Houston, MD;  Location: [facility];  Service: Endoscopy;  Laterality: N/A;  1030   COLONOSCOPY W/ BIOPSIES AND POLYPECTOMY     Left knee arthroscopy     Left shoulder arthroscopy     X 2    Home Medications:  Allergies as of 06/01/2022       Reactions   Bee Venom Anaphylaxis   Penicillins Itching, Rash        Medication List        Accurate as of June 01, 2022 11:36 AM. If you have any questions, ask your nurse or doctor.          clobetasol cream 0.05 % Commonly known as: TEMOVATE Apply 1 Application topically 2 (two) times daily.   donepezil 5 MG tablet Commonly known as: ARICEPT Take 1 tablet (5 mg total) by mouth at bedtime.   EPINEPHrine 0.3 mg/0.3 mL Soaj injection Commonly known as: EPI-PEN Inject 0.3 mg into the muscle as needed for anaphylaxis.   gabapentin 100 MG capsule Commonly known as: NEURONTIN TAKE 1 CAPSULE BY MOUTH THREE TIMES A DAY.   meclizine 25 MG tablet Commonly known as: ANTIVERT Take 1 tablet (25 mg total) by mouth 2 (two) times daily as needed for dizziness.    multivitamin with minerals Tabs tablet Take 1 tablet by mouth daily.   SYSTANE COMPLETE OP Apply to eye.        Allergies:  Allergies  Allergen Reactions   Bee Venom Anaphylaxis   Penicillins Itching and Rash    Family History: Family History  Problem Relation Age of Onset   Chronic Renal Failure Mother    Heart attack Father    Cancer Brother    Osteoarthritis Daughter    Colon cancer Neg Hx     Social History:  reports that he quit smoking about 33 years ago. His smoking use included cigarettes. He has a 40.00 pack-year smoking history. He has never used smokeless tobacco. He reports that he does not currently use alcohol. He reports that he does not use drugs.  ROS: All other review of systems were reviewed and are negative except what is noted above in HPI  Physical Exam: BP 132/74   Pulse 80   Constitutional:  Alert and oriented, No acute distress. HEENT: [place] AT, moist mucus membranes.  Trachea midline, no masses. Cardiovascular: No clubbing, cyanosis, or edema. Respiratory: Normal respiratory effort, no increased work of breathing. GI: Abdomen is soft, nontender, nondistended, no abdominal masses GU: No CVA tenderness. Circumcised phallus. No masses/lesions on  penis, testis, scrotum. Prostate 80g smooth no nodules no induration.  Lymph: No cervical or inguinal lymphadenopathy. Skin: No rashes, bruises or suspicious lesions. Neurologic: Grossly intact, no focal deficits, moving all 4 extremities. Psychiatric: Normal mood and affect.  Laboratory Data: Lab Results  Component Value Date   WBC 7.5 09/21/2021   HGB 14.2 09/21/2021   HCT 42.2 09/21/2021   MCV 94 09/21/2021   PLT 208 09/21/2021    Lab Results  Component Value Date   CREATININE 1.10 08/12/2020    Lab Results  Component Value Date   PSA 0.2 10/09/2019    Lab Results  Component Value Date   TESTOSTERONE 2,515 (H) 08/12/2020    No results found for: "HGBA1C"  Urinalysis     Component Value Date/Time   BILIRUBINUR negative 09/21/2021 1054   KETONESUR negative 09/21/2021 1054   UROBILINOGEN 0.2 09/21/2021 1054   NITRITE Negative 09/21/2021 1054   LEUKOCYTESUR Negative 09/21/2021 1054    No results found for: "LABMICR", "WBCUA", "RBCUA", "LABEPIT", "MUCUS", "BACTERIA"  Pertinent Imaging:  No results found for this or any previous visit.  No results found for this or any previous visit.  No results found for this or any previous visit.  No results found for this or any previous visit.  No results found for this or any previous visit.  No valid procedures specified. No results found for this or any previous visit.  No results found for this or any previous visit.   Assessment & Plan:    1. Benign prostatic hyperplasia with nocturia -We will trial flomax 0.'4mg'$  daily - Urinalysis, Routine w reflex microscopic - BLADDER SCAN AMB NON-IMAGING  2. Urinary urgency -We will trial floamx 0.'4mg'$  daily.If this fails to improve his urgency we will trial mirabegron      No follow-ups on file.  Nicolette Bang, MD  [facility] [place]

## 2022-06-03 ENCOUNTER — Ambulatory Visit: Payer: Medicare HMO | Admitting: Physician Assistant

## 2022-06-03 ENCOUNTER — Encounter: Payer: Self-pay | Admitting: Physician Assistant

## 2022-06-03 VITALS — BP 135/73 | HR 81 | Resp 18 | Ht 70.0 in | Wt 157.0 lb

## 2022-06-03 DIAGNOSIS — R413 Other amnesia: Secondary | ICD-10-CM

## 2022-06-03 MED ORDER — MEMANTINE HCL 5 MG PO TABS: 5.0000 mg | ORAL_TABLET | Freq: Every evening | ORAL | 11 refills | Status: DC

## 2022-06-03 NOTE — Progress Notes (Signed)
Assessment/Plan:    Memory Impairment  Scott Chang is a very pleasant 81 y.o. RH male with  a history of  hyperlipidemia,Vit D deficiency,  OA/DDD of the L spine, peripheral neuropathy, history of BPPV  seen today in follow up to discuss the 05/27/2022 MRI brain, personally reviewed, remarkable for sequela of mild chronic microvascular ischemic changes with age-appropriate volume loss without lobar predominance.  There is unchanged appearance of the posterior pituitary, including a known 5 mm T1 hyperintense lesion at the base of the sella..  Patient had been prescribed donepezil 5 mg daily, but he was unable to tolerate, as he developed dizziness and insomnia while taking the medication.  Otherwise, there are no new symptoms since his prior visit.  Recommendations   Follow up in 6  months. Start memantine 5 mg nightly, side effects discussed.  We may increase to 5 mg twice daily if tolerated. Recommend counseling for grief Continue to control mood and sleep meds as per PCP Recommend good control of cardiovascular risk factors Awaiting neurocognitive testing to further evaluate cognitive concerns and determine other underlying cause of memory changes including potential contribution from sleep, anxiety and depression, and for clarity of the diagnosis.     Subjective:    This patient is accompanied in the office by  who supplements the history.  Previous records as well as any outside records available were reviewed prior to todays visit.  He was last seen on 05/02/2022, at which time his MoCA was 18/30.   Any changes in memory since last visit?  He denies any changes in his memory "about the same ". Tolerating meds?  He was unable to tolerate the medication at night as he was experiencing insomnia and dizziness, but he is doing well taking it during the morning  Initial visit 05/02/2022 How long did patient have memory difficulties? For the last  3-4 years, when he began to  notice  difficulty remembering recent conversations and names of people. Initially this was attributed to age but since his wife death 1 year ago, his memory worsened and now he is "not sure if grief contributed to memory or not, but it is worse"-he says. . Able to do ADLs and likes to work on his vehicles an does acres or yard work. He does not like to do any crossword puzzles or word finding. repeats oneself?" Not so much of that " Disoriented when walking into a room?  Patient denies, but at times he forgets what he came to the room for Leaving objects in unusual places?  Patient denies, but does forget where he left common objects such as remote, keys and phone.   Patient lives  alone since his wife died on 2021/05/10. Ambulates  with difficulty?   Patient denies   Recent falls?  Patient denies   Any head injuries?  Patient denies   History of seizures?   Patient denies   Wandering behavior?  Patient denies   Patient drives?  No issues. "I still do a great job".  Any mood changes ? He denies Any depression?:  Patient denies a history of, but his wife died 1 year ago and at times he feels sad, he is dealing with grief.  Hallucinations?  Patient denies   Paranoia?  Patient denies   Denies vivid dreams, REM behavior or sleepwalking. He has insomnia, does not feel rested in most instances .    History of sleep apnea?  Patient denies   Any hygiene concerns?  Patient denies   Independent of bathing and dressing?  Endorsed  Does the patient needs help with medications? Patient in charge  Occasionally misses doses  Who is in charge of the finances?  Patient  is in charge   Any changes in appetite?  Patient denies   Trying to eliminate some foods of the diet due to skin rashes followed by Dermatology Patient have trouble swallowing? Patient denies   Does the patient cook?  Patient denies   Any kitchen accidents such as leaving the stove on? Patient denies   Any headaches?  Patient denies   Double vision?  Patient denies   Any focal numbness or tingling?  Patient denies   Chronic back pain Patient denies   Unilateral weakness?  Patient denies   Any tremors?  Patient denies   Any history of anosmia?  Patient denies   Any incontinence of urine? Urgency incontinence and BPH, on Ditropan Any bowel dysfunction?   Patient denies   History of heavy alcohol intake?  Patient denies   History of heavy tobacco use?  Patient denies   Family history of dementia?   Unknown, parents died very young. 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  CURRENT MEDICATIONS:  Outpatient Encounter Medications as of 06/03/2022  Medication Sig   clobetasol cream (TEMOVATE) 8.24 % Apply 1 Application topically 2 (two) times daily. (Patient not taking: Reported on 06/01/2022)   donepezil (ARICEPT) 5 MG tablet Take 1 tablet (5 mg total) by mouth at bedtime.   EPINEPHrine 0.3 mg/0.3 mL IJ SOAJ injection Inject 0.3 mg into the muscle as needed for anaphylaxis.   gabapentin (NEURONTIN) 100 MG capsule TAKE 1 CAPSULE BY MOUTH THREE TIMES A DAY.   meclizine (ANTIVERT) 25 MG tablet Take 1 tablet (25 mg total) by mouth 2 (two) times daily as needed for dizziness.   Multiple Vitamin (MULTIVITAMIN WITH MINERALS) TABS tablet Take 1 tablet by mouth daily.   Propylene Glycol (SYSTANE COMPLETE OP) Apply to eye.   tamsulosin (FLOMAX) 0.4 MG CAPS capsule Take 1 capsule (0.4 mg total) by mouth daily after supper.   No  facility-administered encounter medications on file as of 06/03/2022.        No data to display            05/03/2022    9:52 AM 05/02/2022    8:00 AM  Montreal Cognitive Assessment   Visuospatial/ Executive (0/5) 1 1  Naming (0/3) 3 3  Attention: Read list of digits (0/2) 2 2  Attention: Read list of letters (0/1) 1 1  Attention: Serial 7 subtraction starting at 100 (0/3) 1 1  Language: Repeat phrase (0/2) 2 2  Language : Fluency (0/1) 0 0  Abstraction (0/2) 1 1  Delayed Recall (0/5) 0 0  Orientation (0/6) 6 6  Total 17 17  Adjusted Score (based on education) 18 17   Thank you for allowing [place] the opportunity to participate in the care of this nice patient. Please do  not hesitate to contact us for any questions or concerns.   Total time spent on today's visit was 21 minutes dedicated to this patient today, preparing to see patient, examining the patient, ordering tests and/or medications and counseling the patient, documenting clinical information in the EHR or other health record, independently interpreting results and communicating results to the patient/family, discussing treatment and goals, answering patient's questions and coordinating care.  Cc:  Lindell Spar, MD  Sharene Butters 06/03/2022 6:43 AM

## 2022-06-03 NOTE — Patient Instructions (Addendum)
It was a pleasure to see you today at our office.   Recommendations:  Follow up in 6 months Start Memantine '5mg'$  tablets.  Take 1 tablet at bedtime   Keep neurocognitive testing   Memory  decline, memory medications: Call our office [phone]   For psychiatric meds, mood meds: Please have your primary care physician manage these medications.     For assessment of decision of mental capacity and competency:  Call Dr. Anthoney Harada, geriatric psychiatrist at [phone]  For guidance in geriatric dementia issues please call [facility] [phone]     If you have any severe symptoms of a stroke, or other severe issues such as confusion,severe chills or fever, etc call 911 or go to the ER as you may need to be evaluated further        RECOMMENDATIONS FOR ALL PATIENTS WITH MEMORY PROBLEMS: 1. Continue to exercise (Recommend 30 minutes of walking everyday, or 3 hours every week) 2. Increase social interactions - continue going to [place] and enjoy social gatherings with friends and family 3. Eat healthy, avoid fried foods and eat more fruits and vegetables 4. Maintain adequate blood pressure, blood sugar, and blood cholesterol level. Reducing the risk of stroke and cardiovascular disease also helps promoting better memory. 5. Avoid stressful situations. Live a simple life and avoid aggravations. Organize your time and prepare for the next day in anticipation. 6. Sleep well, avoid any interruptions of sleep and avoid any distractions in the bedroom that may interfere with adequate sleep quality 7. Avoid sugar, avoid sweets as there is a strong link between excessive sugar intake, diabetes, and cognitive impairment We discussed the Mediterranean diet, which has been shown to help patients reduce the risk of progressive memory disorders and reduces cardiovascular risk. This includes eating fish, eat fruits and green leafy vegetables, nuts like almonds and hazelnuts, walnuts,  and also use olive oil. Avoid fast foods and fried foods as much as possible. Avoid sweets and sugar as sugar use has been linked to worsening of memory function.  There is always a concern of gradual progression of memory problems. If this is the case, then we may need to adjust level of care according to patient needs. Support, both to the patient and caregiver, should then be put into place.    The Alzheimer's Association is here all day, every day for people facing Alzheimer's disease through our free 24/7 Helpline: [phone]. The Helpline provides reliable information and support to all those who need assistance, such as individuals living with memory loss, Alzheimer's or other dementia, caregivers, health care professionals and the public.  Our highly trained and knowledgeable staff can help you with: Understanding memory loss, dementia and Alzheimer's  Medications and other treatment options  General information about aging and brain health  Skills to provide quality care and to find the best care from professionals  Legal, financial and living-arrangement decisions Our Helpline also features: Confidential care consultation provided by master's level clinicians who can help with decision-making support, crisis assistance and education on issues families face every day  Help in a caller's preferred language using our translation service that features more than 200 languages and dialects  Referrals to local community programs, services and ongoing support     FALL PRECAUTIONS: Be cautious when walking. Scan the area for obstacles that may increase the risk of trips and falls. When getting up in the mornings, sit up at the edge of the bed for a few minutes before  getting out of bed. Consider elevating the bed at the head end to avoid drop of blood pressure when getting up. Walk always in a well-lit room (use night lights in the walls). Avoid area rugs or power cords from appliances in the  middle of the walkways. Use a walker or a cane if necessary and consider physical therapy for balance exercise. Get your eyesight checked regularly.  FINANCIAL OVERSIGHT: Supervision, especially oversight when making financial decisions or transactions is also recommended.  HOME SAFETY: Consider the safety of the kitchen when operating appliances like stoves, microwave oven, and blender. Consider having supervision and share cooking responsibilities until no longer able to participate in those. Accidents with firearms and other hazards in the house should be identified and addressed as well.   ABILITY TO BE LEFT ALONE: If patient is unable to contact 911 operator, consider using LifeLine, or when the need is there, arrange for someone to stay with patients. Smoking is a fire hazard, consider supervision or cessation. Risk of wandering should be assessed by caregiver and if detected at any point, supervision and safe proof recommendations should be instituted.  MEDICATION SUPERVISION: Inability to self-administer medication needs to be constantly addressed. Implement a mechanism to ensure safe administration of the medications.   DRIVING: Regarding driving, in patients with progressive memory problems, driving will be impaired. We advise to have someone else do the driving if trouble finding directions or if minor accidents are reported. Independent driving assessment is available to determine safety of driving.   If you are interested in the driving assessment, you can contact the following:  [employer] in [place]  [place] [phone] or [phone]      [place] refers to food and lifestyle choices that are based on the traditions of countries located on the [employer]. This way of eating has been shown to help prevent certain  conditions and improve outcomes for people who have chronic diseases, like kidney disease and heart disease. What are tips for following this plan? Lifestyle  Cook and eat meals together with your family, when possible. Drink enough fluid to keep your urine clear or pale yellow. Be physically active every day. This includes: Aerobic exercise like running or swimming. Leisure activities like gardening, walking, or housework. Get 7-8 hours of sleep each night. If recommended by your health care provider, drink red wine in moderation. This means 1 glass a day for nonpregnant women and 2 glasses a day for men. A glass of wine equals 5 oz (150 mL). Reading food labels  Check the serving size of packaged foods. For foods such as rice and pasta, the serving size refers to the amount of cooked product, not dry. Check the total fat in packaged foods. Avoid foods that have saturated fat or trans fats. Check the ingredients list for added sugars, such as corn syrup. Shopping  At the grocery store, buy most of your food from the areas near the walls of the store. This includes: Fresh fruits and vegetables (produce). Grains, beans, nuts, and seeds. Some of these may be available in unpackaged forms or large amounts (in bulk). Fresh seafood. Poultry and eggs. Low-fat dairy products. Buy whole ingredients instead of prepackaged foods. Buy fresh fruits and vegetables in-season from local farmers markets. Buy frozen fruits and vegetables in resealable bags. If you do not have access to quality fresh seafood, buy precooked frozen shrimp  or canned fish, such as tuna, salmon, or sardines. Buy small amounts of raw or cooked vegetables, salads, or olives from the deli or salad bar at your store. Stock your pantry so you always have certain foods on hand, such as olive oil, canned tuna, canned tomatoes, rice, pasta, and beans. Cooking  Cook foods with extra-virgin olive oil instead of using butter or other  vegetable oils. Have meat as a side dish, and have vegetables or grains as your main dish. This means having meat in small portions or adding small amounts of meat to foods like pasta or stew. Use beans or vegetables instead of meat in common dishes like chili or lasagna. Experiment with different cooking methods. Try roasting or broiling vegetables instead of steaming or sauteing them. Add frozen vegetables to soups, stews, pasta, or rice. Add nuts or seeds for added healthy fat at each meal. You can add these to yogurt, salads, or vegetable dishes. Marinate fish or vegetables using olive oil, lemon juice, garlic, and fresh herbs. Meal planning  Plan to eat 1 vegetarian meal one day each week. Try to work up to 2 vegetarian meals, if possible. Eat seafood 2 or more times a week. Have healthy snacks readily available, such as: Vegetable sticks with hummus. Greek yogurt. Fruit and nut trail mix. Eat balanced meals throughout the week. This includes: Fruit: 2-3 servings a day Vegetables: 4-5 servings a day Low-fat dairy: 2 servings a day Fish, poultry, or lean meat: 1 serving a day Beans and legumes: 2 or more servings a week Nuts and seeds: 1-2 servings a day Whole grains: 6-8 servings a day Extra-virgin olive oil: 3-4 servings a day Limit red meat and sweets to only a few servings a month What are my food choices? Mediterranean diet Recommended Grains: Whole-grain pasta. Brown rice. Bulgar wheat. Polenta. Couscous. Whole-wheat bread. [place]. Vegetables: Artichokes. Beets. Broccoli. Cabbage. Carrots. Eggplant. Green beans. Chard. Kale. Spinach. Onions. Leeks. Peas. Squash. Tomatoes. Peppers. Radishes. Fruits: Apples. Apricots. Avocado. Berries. Bananas. Cherries. Dates. Figs. Grapes. Lemons. Melon. Oranges. Peaches. Plums. Pomegranate. Meats and other protein foods: Beans. Almonds. Sunflower seeds. Pine nuts. Peanuts. Balm. Salmon. Scallops. Shrimp. [place]. Tilapia. Clams.  Oysters. Eggs. Dairy: Low-fat milk. Cheese. Greek yogurt. Beverages: Water. Red wine. Herbal tea. Fats and oils: Extra virgin olive oil. Avocado oil. Grape seed oil. Sweets and desserts: [place] yogurt with honey. Baked apples. Poached pears. Trail mix. Seasoning and other foods: Basil. Cilantro. Coriander. Cumin. Mint. Parsley. Sage. Rosemary. Tarragon. Garlic. Oregano. Thyme. Pepper. Balsalmic vinegar. Tahini. Hummus. Tomato sauce. Olives. Mushrooms. Limit these Grains: Prepackaged pasta or rice dishes. Prepackaged cereal with added sugar. Vegetables: Deep fried potatoes (french fries). Fruits: Fruit canned in syrup. Meats and other protein foods: Beef. Pork. Lamb. Poultry with skin. Hot dogs. [place] Salines. Dairy: Ice cream. Sour cream. Whole milk. Beverages: Juice. Sugar-sweetened soft drinks. Beer. Liquor and spirits. Fats and oils: Butter. Canola oil. Vegetable oil. Beef fat (tallow). Lard. Sweets and desserts: Cookies. Cakes. Pies. Candy. Seasoning and other foods: Mayonnaise. Premade sauces and marinades. The items listed may not be a complete list. Talk with your dietitian about what dietary choices are right for you. Summary The Mediterranean diet includes both food and lifestyle choices. Eat a variety of fresh fruits and vegetables, beans, nuts, seeds, and whole grains. Limit the amount of red meat and sweets that you eat. Talk with your health care provider about whether it is safe for you to drink red wine in moderation. This means 1 glass  a day for nonpregnant women and 2 glasses a day for men. A glass of wine equals 5 oz (150 mL). This information is not intended to replace advice given to you by your health care provider. Make sure you discuss any questions you have with your health care provider. Document Released: 01/28/2016 Document Revised: 03/01/2016 Document Reviewed: 01/28/2016 Elsevier Interactive Patient Education  2017 [employer].

## 2022-06-22 DIAGNOSIS — H608X3 Other otitis externa, bilateral: Secondary | ICD-10-CM | POA: Diagnosis not present

## 2022-06-22 DIAGNOSIS — H903 Sensorineural hearing loss, bilateral: Secondary | ICD-10-CM | POA: Diagnosis not present

## 2022-07-03 ENCOUNTER — Encounter: Payer: Self-pay | Admitting: Physician Assistant

## 2022-07-04 ENCOUNTER — Other Ambulatory Visit: Payer: Self-pay | Admitting: Physician Assistant

## 2022-07-04 MED ORDER — MEMANTINE HCL 5 MG PO TABS: 5.0000 mg | ORAL_TABLET | Freq: Two times a day (BID) | ORAL | 11 refills | Status: DC

## 2022-07-07 DIAGNOSIS — R42 Dizziness and giddiness: Secondary | ICD-10-CM | POA: Diagnosis not present

## 2022-07-07 DIAGNOSIS — H903 Sensorineural hearing loss, bilateral: Secondary | ICD-10-CM | POA: Diagnosis not present

## 2022-07-11 ENCOUNTER — Ambulatory Visit: Payer: Medicare HMO | Admitting: Urology

## 2022-07-13 DIAGNOSIS — L308 Other specified dermatitis: Secondary | ICD-10-CM | POA: Diagnosis not present

## 2022-07-20 ENCOUNTER — Encounter: Payer: Self-pay | Admitting: Internal Medicine

## 2022-07-20 ENCOUNTER — Ambulatory Visit (INDEPENDENT_AMBULATORY_CARE_PROVIDER_SITE_OTHER): Payer: Medicare HMO | Admitting: Internal Medicine

## 2022-07-20 VITALS — BP 133/72 | HR 87 | Ht 70.0 in | Wt 160.6 lb

## 2022-07-20 DIAGNOSIS — E559 Vitamin D deficiency, unspecified: Secondary | ICD-10-CM

## 2022-07-20 DIAGNOSIS — F411 Generalized anxiety disorder: Secondary | ICD-10-CM | POA: Insufficient documentation

## 2022-07-20 DIAGNOSIS — G309 Alzheimer's disease, unspecified: Secondary | ICD-10-CM | POA: Diagnosis not present

## 2022-07-20 DIAGNOSIS — E038 Other specified hypothyroidism: Secondary | ICD-10-CM

## 2022-07-20 DIAGNOSIS — F0280 Dementia in other diseases classified elsewhere without behavioral disturbance: Secondary | ICD-10-CM | POA: Diagnosis not present

## 2022-07-20 DIAGNOSIS — R351 Nocturia: Secondary | ICD-10-CM

## 2022-07-20 DIAGNOSIS — E782 Mixed hyperlipidemia: Secondary | ICD-10-CM

## 2022-07-20 DIAGNOSIS — Z0001 Encounter for general adult medical examination with abnormal findings: Secondary | ICD-10-CM | POA: Diagnosis not present

## 2022-07-20 DIAGNOSIS — N401 Enlarged prostate with lower urinary tract symptoms: Secondary | ICD-10-CM

## 2022-07-20 DIAGNOSIS — R739 Hyperglycemia, unspecified: Secondary | ICD-10-CM | POA: Diagnosis not present

## 2022-07-20 MED ORDER — TRAZODONE HCL 50 MG PO TABS: 25.0000 mg | ORAL_TABLET | Freq: Every evening | ORAL | 3 refills | Status: DC | PRN

## 2022-07-20 NOTE — Patient Instructions (Signed)
Please start taking Trazodone as prescribed.  Please maintain simple sleep hygiene. - Maintain dark and non-noisy environment in the bedroom. - Please use the bedroom for sleep only. - Do not use electronic devices in the bedroom. - Please take dinner at least 2 hours before bedtime. - Please avoid caffeinated products in the evening, including coffee, soft drinks. - Please try to maintain the regular sleep-wake cycle - Go to bed and wake up at the same time.  Please continue taking other medications as prescribed.

## 2022-07-20 NOTE — Assessment & Plan Note (Signed)
On Namenda Did not tolerate Aricept Followed by Neurology

## 2022-07-20 NOTE — Progress Notes (Signed)
Established Patient Office Visit  Subjective:  Patient ID: Scott Chang, male    DOB: 1940-09-02  Age: 82 y.o. MRN: [id number]  CC:  Chief Complaint  Patient presents with   Annual Exam    Patient is having trouble sleeping at night     HPI Scott Chang is a 82 y.o. male with past medical history of OA, DDD of lumbar spine and BPPV who presents for annual physical.  Memory problem: He c/o difficulty remembering even recent things.  He has had neurology evaluation, and was initially placed on donepezil and recently switched to [place]. He currently lives alone and is independent for ADLs.  Denies any episode of confusion or agitation, but feels frustrated with memory concern.  He reports constant anxiety at nighttime and insomnia.  He sleeps about 3-4 hours at nighttime.  Denies any daytime naps.  He feels fatigued due to lack of sleep.  Denies any recent weight change, night sweats or LAD.  He has nocturia and urinary urgency.  He has tried Flomax, Uroxatrol and oxybutynin for these.  He had urology evaluation recently and was placed on Flomax again (?).  He has not seen any difference in his symptoms.   Past Medical History:  Diagnosis Date   Anxiety    Arthritis    Colon polyps    Dyspnea on exertion 04/18/2019   HLD (hyperlipidemia) 04/18/2019   Hypercholesteremia    Malaise and fatigue 04/18/2019   Neuropathy     Past Surgical History:  Procedure Laterality Date   APPENDECTOMY     CHOLECYSTECTOMY     COLONOSCOPY N/A 05/09/2013   Procedure: COLONOSCOPY;  Surgeon: Rogene Houston, MD;  [facility];  Service: Endoscopy;  Laterality: N/A;  20   COLONOSCOPY W/ BIOPSIES AND POLYPECTOMY     Left knee arthroscopy     Left shoulder arthroscopy     X 2    Family History  Problem Relation Age of Onset   Chronic Renal Failure Mother    Heart attack Father    Cancer Brother    Osteoarthritis Daughter    Colon cancer Neg Hx     Social History    Socioeconomic History   Marital status: Married    Spouse name: Not on file   Number of children: Not on file   Years of education: 1   Highest education level: Not on file  Occupational History   Not on file  Tobacco Use   Smoking status: Former    Packs/day: 1.00    Years: 40.00    Total pack years: 40.00    Types: Cigarettes    Quit date: 06/21/1988    Years since quitting: 34.1   Smokeless tobacco: Never  Vaping Use   Vaping Use: Never used  Substance and Sexual Activity   Alcohol use: Not Currently   Drug use: No   Sexual activity: Not on file  Other Topics Concern   Not on file  Social History Narrative   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   Right handed    Drinks caffeine   One floor home   Lives byhimeself   Social Determinants of Health   Financial Resource Strain: Low Risk  (01/17/2022)   Overall Financial Resource Strain (CARDIA)    Difficulty of Paying Living Expenses: Not hard at all  Food Insecurity: No Food Insecurity (01/17/2022)   Hunger Vital Sign    Worried About Charity fundraiser in  the Last Year: Never true    [place] in the Last Year: Never true  Transportation Needs: No Transportation Needs (01/17/2022)   PRAPARE - Hydrologist (Medical): No    Lack of Transportation (Non-Medical): No  Physical Activity: Sufficiently Active (01/17/2022)   Exercise Vital Sign    Days of Exercise per Week: 5 days    Minutes of Exercise per Session: 140 min  Stress: Stress Concern Present (01/17/2022)   [place]    Feeling of Stress : To some extent  Social Connections: Socially Isolated (01/17/2022)   Social Connection and Isolation Panel [NHANES]    Frequency of Communication with Friends and Family: More than three times a week    Frequency of Social Gatherings with Friends and Family: More than three times a week    Attends  Religious Services: Never    Marine scientist or Organizations: No    Attends Archivist Meetings: Never    Marital Status: Widowed  Intimate Partner Violence: Not At Risk (01/17/2022)   Humiliation, Afraid, Rape, and Kick questionnaire    Fear of Current or Ex-Partner: No    Emotionally Abused: No    Physically Abused: No    Sexually Abused: No    Outpatient Medications Prior to Visit  Medication Sig Dispense Refill   clobetasol cream (TEMOVATE) 2.83 % Apply 1 Application topically 2 (two) times daily. (Patient not taking: Reported on 06/01/2022) 30 g 0   donepezil (ARICEPT) 5 MG tablet Take 1 tablet (5 mg total) by mouth at bedtime. 30 tablet 11   EPINEPHrine 0.3 mg/0.3 mL IJ SOAJ injection Inject 0.3 mg into the muscle as needed for anaphylaxis. 1 each 1   meclizine (ANTIVERT) 25 MG tablet Take 1 tablet (25 mg total) by mouth 2 (two) times daily as needed for dizziness. 30 tablet 1   memantine (NAMENDA) 5 MG tablet Take 1 tablet (5 mg total) by mouth 2 (two) times daily. 60 tablet 11   Multiple Vitamin (MULTIVITAMIN WITH MINERALS) TABS tablet Take 1 tablet by mouth daily.     Propylene Glycol (SYSTANE COMPLETE OP) Apply to eye.     tamsulosin (FLOMAX) 0.4 MG CAPS capsule Take 1 capsule (0.4 mg total) by mouth daily after supper. 30 capsule 11   gabapentin (NEURONTIN) 100 MG capsule TAKE 1 CAPSULE BY MOUTH THREE TIMES A DAY. 90 capsule 3   No facility-administered medications prior to visit.    Allergies  Allergen Reactions   Bee Venom Anaphylaxis   Penicillins Itching and Rash    ROS Review of Systems  Constitutional:  Positive for fatigue. Negative for chills and fever.  HENT:  Negative for congestion and sore throat.        B/l ear discomfort  Eyes:  Negative for pain and discharge.  Respiratory:  Negative for cough and shortness of breath.   Cardiovascular:  Negative for chest pain and palpitations.  Gastrointestinal:  Negative for diarrhea, nausea and  vomiting.  Endocrine: Negative for polydipsia and polyuria.  Genitourinary:  Positive for urgency. Negative for dysuria and hematuria.       Nocturia  Musculoskeletal:  Negative for neck pain and neck stiffness.  Skin:  Negative for rash.  Neurological:  Positive for dizziness. Negative for weakness, numbness and headaches.  Psychiatric/Behavioral:  Positive for sleep disturbance. Negative for agitation and behavioral problems. The patient is nervous/anxious.  Objective:    Physical Exam Vitals reviewed.  Constitutional:      General: He is not in acute distress.    Appearance: He is not diaphoretic.  HENT:     Head: Normocephalic and atraumatic.     Right Ear: Ear canal and external ear normal. There is no impacted cerumen.     Left Ear: Ear canal and external ear normal. There is no impacted cerumen.     Nose: Nose normal.     Mouth/Throat:     Mouth: Mucous membranes are moist.  Eyes:     General: No scleral icterus.    Extraocular Movements: Extraocular movements intact.  Cardiovascular:     Rate and Rhythm: Normal rate and regular rhythm.     Pulses: Normal pulses.     Heart sounds: Normal heart sounds. No murmur heard. Pulmonary:     Breath sounds: Normal breath sounds. No wheezing or rales.  Abdominal:     Palpations: Abdomen is soft.     Tenderness: There is no abdominal tenderness.  Musculoskeletal:     Cervical back: Neck supple. No tenderness.     Right lower leg: No edema.     Left lower leg: No edema.  Skin:    General: Skin is warm.     Findings: Lesion (Actinic keratoses - over right arm near elbow and hip area) present. No rash.  Neurological:     General: No focal deficit present.     Mental Status: He is alert and oriented to person, place, and time.     Cranial Nerves: No cranial nerve deficit.     Sensory: No sensory deficit.     Motor: No weakness.  Psychiatric:        Mood and Affect: Mood normal.        Behavior: Behavior normal.      BP 133/72 (BP Location: Left Arm, Patient Position: Sitting, Cuff Size: Normal)   Pulse 87   Ht '5\' 10"'$  (1.778 m)   Wt 160 lb 9.6 oz (72.8 kg)   SpO2 99%   BMI 23.04 kg/m  Wt Readings from Last 3 Encounters:  07/20/22 160 lb 9.6 oz (72.8 kg)  06/03/22 157 lb (71.2 kg)  05/02/22 154 lb 3.2 oz (69.9 kg)    Lab Results  Component Value Date   TSH 6.540 (H) 01/17/2022   Lab Results  Component Value Date   WBC 7.5 09/21/2021   HGB 14.2 09/21/2021   HCT 42.2 09/21/2021   MCV 94 09/21/2021   PLT 208 09/21/2021   Lab Results  Component Value Date   NA 137 08/12/2020   K 4.6 08/12/2020   CO2 30 08/12/2020   GLUCOSE 98 08/12/2020   BUN 14 08/12/2020   CREATININE 1.10 08/12/2020   BILITOT 0.6 08/12/2020   AST 47 (H) 08/12/2020   ALT 45 08/12/2020   PROT 6.9 08/12/2020   CALCIUM 9.0 08/12/2020   No results found for: "CHOL" No results found for: "HDL" No results found for: "LDLCALC" No results found for: "TRIG" No results found for: "CHOLHDL" No results found for: "HGBA1C"    Assessment & Plan:   Problem List Items Addressed This Visit       Endocrine   Subclinical hypothyroidism    Lab Results  Component Value Date   TSH 6.540 (H) 01/17/2022  With normal free T4 Recheck TSH and free T4 - if persistently elevated, will start Levothyroxine      Relevant Orders  TSH + free T4     Nervous and Auditory   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)    On Namenda Did not tolerate Aricept Followed by Neurology      Relevant Medications   traZODone (DESYREL) 50 MG tablet   Other Relevant Orders   CMP14+EGFR   CBC with Differential/Platelet     Other   Vitamin D deficiency   Relevant Orders   VITAMIN D 25 Hydroxy (Vit-D Deficiency, Fractures)   HLD (hyperlipidemia)   Relevant Orders   Lipid panel   Benign prostatic hyperplasia with nocturia    Has tried Flomax and Uroxatral with no relief UA reviewed Followed by Urology      Relevant Orders    PSA   Encounter for general adult medical examination with abnormal findings - Primary    Physical exam as documented. Fasting blood tests ordered.      Relevant Orders   CMP14+EGFR   CBC with Differential/Platelet   GAD (generalized anxiety disorder)    Has anxiety and insomnia Started Trazodone PRN for insomnia       Relevant Medications   traZODone (DESYREL) 50 MG tablet   Other Visit Diagnoses     Hyperglycemia       Relevant Orders   Hemoglobin A1c       Meds ordered this encounter  Medications   traZODone (DESYREL) 50 MG tablet    Sig: Take 0.5-1 tablets (25-50 mg total) by mouth at bedtime as needed for sleep.    Dispense:  30 tablet    Refill:  3    Follow-up: Return in about 6 weeks (around 08/31/2022) for GAD and insomnia.    Lindell Spar, MD

## 2022-07-20 NOTE — Assessment & Plan Note (Signed)
Has anxiety and insomnia Started Trazodone PRN for insomnia

## 2022-07-20 NOTE — Assessment & Plan Note (Signed)
Has tried Flomax and Uroxatral with no relief UA reviewed Followed by Urology

## 2022-07-20 NOTE — Assessment & Plan Note (Signed)
Lab Results  Component Value Date   TSH 6.540 (H) 01/17/2022   With normal free T4 Recheck TSH and free T4 - if persistently elevated, will start Levothyroxine

## 2022-07-20 NOTE — Assessment & Plan Note (Signed)
Physical exam as documented. Fasting blood tests ordered. 

## 2022-07-21 DIAGNOSIS — E038 Other specified hypothyroidism: Secondary | ICD-10-CM | POA: Diagnosis not present

## 2022-07-21 DIAGNOSIS — F0280 Dementia in other diseases classified elsewhere without behavioral disturbance: Secondary | ICD-10-CM | POA: Diagnosis not present

## 2022-07-21 DIAGNOSIS — E782 Mixed hyperlipidemia: Secondary | ICD-10-CM | POA: Diagnosis not present

## 2022-07-21 DIAGNOSIS — E559 Vitamin D deficiency, unspecified: Secondary | ICD-10-CM | POA: Diagnosis not present

## 2022-07-21 DIAGNOSIS — N401 Enlarged prostate with lower urinary tract symptoms: Secondary | ICD-10-CM | POA: Diagnosis not present

## 2022-07-21 DIAGNOSIS — Z0001 Encounter for general adult medical examination with abnormal findings: Secondary | ICD-10-CM | POA: Diagnosis not present

## 2022-07-21 DIAGNOSIS — R739 Hyperglycemia, unspecified: Secondary | ICD-10-CM | POA: Diagnosis not present

## 2022-07-21 DIAGNOSIS — R351 Nocturia: Secondary | ICD-10-CM | POA: Diagnosis not present

## 2022-07-21 DIAGNOSIS — G309 Alzheimer's disease, unspecified: Secondary | ICD-10-CM | POA: Diagnosis not present

## 2022-07-22 ENCOUNTER — Other Ambulatory Visit: Payer: Self-pay | Admitting: Internal Medicine

## 2022-07-22 DIAGNOSIS — E039 Hypothyroidism, unspecified: Secondary | ICD-10-CM

## 2022-07-22 LAB — CMP14+EGFR
ALT: 32 IU/L (ref 0–44)
AST: 32 IU/L (ref 0–40)
Albumin/Globulin Ratio: 1.8 (ref 1.2–2.2)
Albumin: 4.4 g/dL (ref 3.8–4.8)
Alkaline Phosphatase: 77 IU/L (ref 44–121)
BUN/Creatinine Ratio: 19 (ref 10–24)
BUN: 16 mg/dL (ref 8–27)
Bilirubin Total: 0.5 mg/dL (ref 0.0–1.2)
CO2: 23 mmol/L (ref 20–29)
Calcium: 9.4 mg/dL (ref 8.6–10.2)
Chloride: 100 mmol/L (ref 96–106)
Creatinine, Ser: 0.83 mg/dL (ref 0.76–1.27)
Globulin, Total: 2.5 g/dL (ref 1.5–4.5)
Glucose: 101 mg/dL — ABNORMAL HIGH (ref 70–99)
Potassium: 5 mmol/L (ref 3.5–5.2)
Sodium: 137 mmol/L (ref 134–144)
Total Protein: 6.9 g/dL (ref 6.0–8.5)
eGFR: 89 mL/min/{1.73_m2} (ref >59)

## 2022-07-22 LAB — CBC WITH DIFFERENTIAL/PLATELET
Basophils Absolute: 0.1 10*3/uL (ref 0.0–0.2)
Basos: 1 %
EOS (ABSOLUTE): 0.2 10*3/uL (ref 0.0–0.4)
Eos: 3 %
Hematocrit: 42.9 % (ref 37.5–51.0)
Hemoglobin: 14.3 g/dL (ref 13.0–17.7)
Immature Grans (Abs): 0 10*3/uL (ref 0.0–0.1)
Immature Granulocytes: 0 %
Lymphocytes Absolute: 2 10*3/uL (ref 0.7–3.1)
Lymphs: 29 %
MCH: 32.2 pg (ref 26.6–33.0)
MCHC: 33.3 g/dL (ref 31.5–35.7)
MCV: 97 fL (ref 79–97)
Monocytes Absolute: 0.7 10*3/uL (ref 0.1–0.9)
Monocytes: 10 %
Neutrophils Absolute: 4 10*3/uL (ref 1.4–7.0)
Neutrophils: 57 %
Platelets: 207 10*3/uL (ref 150–450)
RBC: 4.44 x10E6/uL (ref 4.14–5.80)
RDW: 13 % (ref 11.6–15.4)
WBC: 6.9 10*3/uL (ref 3.4–10.8)

## 2022-07-22 LAB — LIPID PANEL
Chol/HDL Ratio: 4.1 ratio (ref 0.0–5.0)
Cholesterol, Total: 224 mg/dL — ABNORMAL HIGH (ref 100–199)
HDL: 54 mg/dL (ref >39)
LDL Chol Calc (NIH): 146 mg/dL — ABNORMAL HIGH (ref 0–99)
Triglycerides: 136 mg/dL (ref 0–149)
VLDL Cholesterol Cal: 24 mg/dL (ref 5–40)

## 2022-07-22 LAB — HEMOGLOBIN A1C
Est. average glucose Bld gHb Est-mCnc: 114 mg/dL
Hgb A1c MFr Bld: 5.6 % (ref 4.8–5.6)

## 2022-07-22 LAB — TSH+FREE T4
Free T4: 1.04 ng/dL (ref 0.82–1.77)
TSH: 4.58 u[IU]/mL — ABNORMAL HIGH (ref 0.450–4.500)

## 2022-07-22 LAB — VITAMIN D 25 HYDROXY (VIT D DEFICIENCY, FRACTURES): Vit D, 25-Hydroxy: 33.5 ng/mL (ref 30.0–100.0)

## 2022-07-22 LAB — PSA: Prostate Specific Ag, Serum: 0.2 ng/mL (ref 0.0–4.0)

## 2022-07-22 MED ORDER — LEVOTHYROXINE SODIUM 25 MCG PO TABS: 25.0000 ug | ORAL_TABLET | Freq: Every day | ORAL | 0 refills | Status: DC

## 2022-08-02 DIAGNOSIS — H04123 Dry eye syndrome of bilateral lacrimal glands: Secondary | ICD-10-CM | POA: Diagnosis not present

## 2022-08-08 ENCOUNTER — Other Ambulatory Visit: Payer: Self-pay | Admitting: Internal Medicine

## 2022-08-08 DIAGNOSIS — F411 Generalized anxiety disorder: Secondary | ICD-10-CM

## 2022-08-16 ENCOUNTER — Ambulatory Visit: Payer: Medicare HMO | Admitting: Urology

## 2022-08-16 ENCOUNTER — Encounter: Payer: Self-pay | Admitting: Urology

## 2022-08-16 VITALS — BP 144/56 | HR 93

## 2022-08-16 DIAGNOSIS — R351 Nocturia: Secondary | ICD-10-CM | POA: Diagnosis not present

## 2022-08-16 DIAGNOSIS — R3915 Urgency of urination: Secondary | ICD-10-CM

## 2022-08-16 DIAGNOSIS — N401 Enlarged prostate with lower urinary tract symptoms: Secondary | ICD-10-CM

## 2022-08-16 MED ORDER — MIRABEGRON ER 25 MG PO TB24: 25.0000 mg | ORAL_TABLET | Freq: Every day | ORAL | 0 refills | Status: DC

## 2022-08-16 NOTE — Progress Notes (Signed)
08/16/2022 4:09 PM   Scott Chang July 19, 1940 [id number]  Referring provider: Lindell Spar, MD [address] [place],  [place] [address]  Followup urinary urgency   HPI: Scott Chang is a 82yo here for followup for BPH and urinary urgency. He notes no improvement in his urgency on flomax 0.'4mg'$  daily. He was started on oxybutynin '5mg'$  which helped his urgency but he has noted issue with concentration and memory.    PMH: Past Medical History:  Diagnosis Date   Anxiety    Arthritis    Colon polyps    Dyspnea on exertion 04/18/2019   HLD (hyperlipidemia) 04/18/2019   Hypercholesteremia    Malaise and fatigue 04/18/2019   Neuropathy     Surgical History: Past Surgical History:  Procedure Laterality Date   APPENDECTOMY     CHOLECYSTECTOMY     COLONOSCOPY N/A 05/09/2013   Procedure: COLONOSCOPY;  Surgeon: Rogene Houston, MD;  Location: [facility];  Service: Endoscopy;  Laterality: N/A;  1030   COLONOSCOPY W/ BIOPSIES AND POLYPECTOMY     Left knee arthroscopy     Left shoulder arthroscopy     X 2    Home Medications:  Allergies as of 08/16/2022       Reactions   Bee Venom Anaphylaxis   Penicillins Itching, Rash        Medication List        Accurate as of August 16, 2022  4:09 PM. If you have any questions, ask your nurse or doctor.          augmented betamethasone dipropionate 0.05 % cream Commonly known as: DIPROLENE-AF Apply topically.   bacitracin-polymyxin b ophthalmic ointment Commonly known as: POLYSPORIN   clobetasol cream 0.05 % Commonly known as: TEMOVATE Apply 1 Application topically 2 (two) times daily.   donepezil 5 MG tablet Commonly known as: ARICEPT Take 1 tablet (5 mg total) by mouth at bedtime.   EPINEPHrine 0.3 mg/0.3 mL Soaj injection Commonly known as: EPI-PEN Inject 0.3 mg into the muscle as needed for anaphylaxis.   erythromycin ophthalmic ointment at bedtime.   levothyroxine 25 MCG tablet Commonly known as:  SYNTHROID Take 1 tablet (25 mcg total) by mouth daily.   meclizine 25 MG tablet Commonly known as: ANTIVERT Take 1 tablet (25 mg total) by mouth 2 (two) times daily as needed for dizziness.   memantine 5 MG tablet Commonly known as: NAMENDA Take 1 tablet (5 mg total) by mouth 2 (two) times daily.   multivitamin with minerals Tabs tablet Take 1 tablet by mouth daily.   SYSTANE COMPLETE OP Apply to eye.   tamsulosin 0.4 MG Caps capsule Commonly known as: FLOMAX Take 1 capsule (0.4 mg total) by mouth daily after supper.   traZODone 50 MG tablet Commonly known as: DESYREL Take 0.5-1 tablets (25-50 mg total) by mouth at bedtime as needed for sleep.        Allergies:  Allergies  Allergen Reactions   Bee Venom Anaphylaxis   Penicillins Itching and Rash    Family History: Family History  Problem Relation Age of Onset   Chronic Renal Failure Mother    Heart attack Father    Cancer Brother    Osteoarthritis Daughter    Colon cancer Neg Hx     Social History:  reports that he quit smoking about 34 years ago. His smoking use included cigarettes. He has a 40.00 pack-year smoking history. He has never used smokeless tobacco. He reports that he  does not currently use alcohol. He reports that he does not use drugs.  ROS: All other review of systems were reviewed and are negative except what is noted above in HPI  Physical Exam: BP (!) 144/56   Pulse 93   Constitutional:  Alert and oriented, No acute distress. HEENT: [place] AT, moist mucus membranes.  Trachea midline, no masses. Cardiovascular: No clubbing, cyanosis, or edema. Respiratory: Normal respiratory effort, no increased work of breathing. GI: Abdomen is soft, nontender, nondistended, no abdominal masses GU: No CVA tenderness.  Lymph: No cervical or inguinal lymphadenopathy. Skin: No rashes, bruises or suspicious lesions. Neurologic: Grossly intact, no focal deficits, moving all 4 extremities. Psychiatric: Normal  mood and affect.  Laboratory Data: Lab Results  Component Value Date   WBC 6.9 07/21/2022   HGB 14.3 07/21/2022   HCT 42.9 07/21/2022   MCV 97 07/21/2022   PLT 207 07/21/2022    Lab Results  Component Value Date   CREATININE 0.83 07/21/2022    Lab Results  Component Value Date   PSA 0.2 10/09/2019    Lab Results  Component Value Date   TESTOSTERONE 2,515 (H) 08/12/2020    Lab Results  Component Value Date   HGBA1C 5.6 07/21/2022    Urinalysis    Component Value Date/Time   BILIRUBINUR negative 09/21/2021 1054   KETONESUR negative 09/21/2021 1054   UROBILINOGEN 0.2 09/21/2021 1054   NITRITE Negative 09/21/2021 1054   LEUKOCYTESUR Negative 09/21/2021 1054    No results found for: "LABMICR", "WBCUA", "RBCUA", "LABEPIT", "MUCUS", "BACTERIA"  Pertinent Imaging:  No results found for this or any previous visit.  No results found for this or any previous visit.  No results found for this or any previous visit.  No results found for this or any previous visit.  No results found for this or any previous visit.  No valid procedures specified. No results found for this or any previous visit.  No results found for this or any previous visit.   Assessment & Plan:    1. Benign prostatic hyperplasia with nocturia -we will start mirabegron '25mg'$  daily and stop flomax 0.'4mg'$  daily - Urinalysis, Routine w reflex microscopic  2. Urinary urgency -mirabegron '25mg'$ . Stop oxybutynin    No follow-ups on file.  Nicolette Bang, MD  [facility] [place]

## 2022-08-16 NOTE — Patient Instructions (Signed)

## 2022-08-31 ENCOUNTER — Encounter: Payer: Self-pay | Admitting: Internal Medicine

## 2022-08-31 ENCOUNTER — Ambulatory Visit (INDEPENDENT_AMBULATORY_CARE_PROVIDER_SITE_OTHER): Payer: Medicare HMO | Admitting: Internal Medicine

## 2022-08-31 VITALS — BP 130/68 | HR 84 | Ht 70.0 in | Wt 160.0 lb

## 2022-08-31 DIAGNOSIS — E782 Mixed hyperlipidemia: Secondary | ICD-10-CM | POA: Diagnosis not present

## 2022-08-31 DIAGNOSIS — F411 Generalized anxiety disorder: Secondary | ICD-10-CM

## 2022-08-31 DIAGNOSIS — R351 Nocturia: Secondary | ICD-10-CM | POA: Diagnosis not present

## 2022-08-31 DIAGNOSIS — N401 Enlarged prostate with lower urinary tract symptoms: Secondary | ICD-10-CM

## 2022-08-31 DIAGNOSIS — E039 Hypothyroidism, unspecified: Secondary | ICD-10-CM

## 2022-08-31 MED ORDER — TRAZODONE HCL 50 MG PO TABS: 50.0000 mg | ORAL_TABLET | Freq: Every day | ORAL | 3 refills | Status: DC

## 2022-08-31 MED ORDER — LEVOTHYROXINE SODIUM 25 MCG PO TABS: 25.0000 ug | ORAL_TABLET | Freq: Every day | ORAL | 0 refills | Status: DC

## 2022-08-31 NOTE — Assessment & Plan Note (Signed)
Had anxiety and insomnia On Trazodone 50 mg qHS PRN for insomnia, now improved Sleep hygiene discussed

## 2022-08-31 NOTE — Progress Notes (Signed)
Established Patient Office Visit  Subjective:  Patient ID: Scott Chang, male    DOB: 1941/03/31  Age: 82 y.o. MRN: [id number]  CC:  Chief Complaint  Patient presents with   Insomnia    Six week follow up for insomnia and GAD    HPI Scott Chang is a 82 y.o. male with past medical history of OA, DDD of lumbar spine and BPPV who presents for f/u of GAD and insomnia.  GAD: He was having constant anxiety at night time and insomnia.  He was given trazodone 50 mg nightly, which has improved his sleep quality.  He still wakes up at nighttime for urinating.  His anxiety and fatigue have also slightly improved due to better sleep quality.  Denies any SI or HI currently.  Hypothyroidism: He was placed on levothyroxine 25 mcg QD.  He has seen slight improvement in fatigue.  Denies any recent change in weight or appetite.  Past Medical History:  Diagnosis Date   Anxiety    Arthritis    Colon polyps    Dyspnea on exertion 04/18/2019   HLD (hyperlipidemia) 04/18/2019   Hypercholesteremia    Malaise and fatigue 04/18/2019   Neuropathy     Past Surgical History:  Procedure Laterality Date   APPENDECTOMY     CHOLECYSTECTOMY     COLONOSCOPY N/A 05/09/2013   Procedure: COLONOSCOPY;  Surgeon: Rogene Houston, MD;  Location: [facility];  Service: Endoscopy;  Laterality: N/A;  62   COLONOSCOPY W/ BIOPSIES AND POLYPECTOMY     Left knee arthroscopy     Left shoulder arthroscopy     X 2    Family History  Problem Relation Age of Onset   Chronic Renal Failure Mother    Heart attack Father    Cancer Brother    Osteoarthritis Daughter    Colon cancer Neg Hx     Social History   Socioeconomic History   Marital status: Married    Spouse name: Not on file   Number of children: Not on file   Years of education: 1   Highest education level: Not on file  Occupational History   Not on file  Tobacco Use   Smoking status: Former    Packs/day: 1.00    Years: 40.00    Total  pack years: 40.00    Types: Cigarettes    Quit date: 06/21/1988    Years since quitting: 34.2   Smokeless tobacco: Never  Vaping Use   Vaping Use: Never used  Substance and Sexual Activity   Alcohol use: Not Currently   Drug use: No   Sexual activity: Not on file  Other Topics Concern   Not on file  Social History Narrative   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   Right handed    Drinks caffeine   One floor home   Lives [place]elf   Social Determinants of Health   Financial Resource Strain: Low Risk  (01/17/2022)   Overall Financial Resource Strain (CARDIA)    Difficulty of Paying Living Expenses: Not hard at all  Food Insecurity: No Food Insecurity (01/17/2022)   Hunger Vital Sign    Worried About Running Out of Food in the Last Year: Never true    Ran Out of Food in the Last Year: Never true  Transportation Needs: No Transportation Needs (01/17/2022)   PRAPARE - Hydrologist (Medical): No    Lack of Transportation (Non-Medical): No  Physical Activity: Sufficiently Active (01/17/2022)   Exercise Vital Sign    Days of Exercise per Week: 5 days    Minutes of Exercise per Session: 140 min  Stress: Stress Concern Present (01/17/2022)   [place]    Feeling of Stress : To some extent  Social Connections: Socially Isolated (01/17/2022)   Social Connection and Isolation Panel [NHANES]    Frequency of Communication with Friends and Family: More than three times a week    Frequency of Social Gatherings with Friends and Family: More than three times a week    Attends Religious Services: Never    Marine scientist or Organizations: No    Attends Archivist Meetings: Never    Marital Status: Widowed  Intimate Partner Violence: Not At Risk (01/17/2022)   Humiliation, Afraid, Rape, and Kick questionnaire    Fear of Current or Ex-Partner: No    Emotionally  Abused: No    Physically Abused: No    Sexually Abused: No    Outpatient Medications Prior to Visit  Medication Sig Dispense Refill   augmented betamethasone dipropionate (DIPROLENE-AF) 0.05 % cream Apply topically.     bacitracin-polymyxin b (POLYSPORIN) ophthalmic ointment      clobetasol cream (TEMOVATE) [id number] % Apply 1 Application topically 2 (two) times daily. (Patient not taking: Reported on 06/01/2022) 30 g 0   donepezil (ARICEPT) 5 MG tablet Take 1 tablet (5 mg total) by mouth at bedtime. 30 tablet 11   EPINEPHrine 0.3 mg/0.3 mL IJ SOAJ injection Inject 0.3 mg into the muscle as needed for anaphylaxis. 1 each 1   erythromycin ophthalmic ointment at bedtime.     meclizine (ANTIVERT) 25 MG tablet Take 1 tablet (25 mg total) by mouth 2 (two) times daily as needed for dizziness. 30 tablet 1   memantine (NAMENDA) 5 MG tablet Take 1 tablet (5 mg total) by mouth 2 (two) times daily. 60 tablet 11   mirabegron ER (MYRBETRIQ) 25 MG TB24 tablet Take 1 tablet (25 mg total) by mouth daily. 30 tablet 0   Multiple Vitamin (MULTIVITAMIN WITH MINERALS) TABS tablet Take 1 tablet by mouth daily.     Propylene Glycol (SYSTANE COMPLETE OP) Apply to eye.     tamsulosin (FLOMAX) 0.4 MG CAPS capsule Take 1 capsule (0.4 mg total) by mouth daily after supper. 30 capsule 11   levothyroxine (SYNTHROID) 25 MCG tablet Take 1 tablet (25 mcg total) by mouth daily. 90 tablet 0   traZODone (DESYREL) 50 MG tablet Take 0.5-1 tablets (25-50 mg total) by mouth at bedtime as needed for sleep. 30 tablet 2   No facility-administered medications prior to visit.    Allergies  Allergen Reactions   Bee Venom Anaphylaxis   Penicillins Itching and Rash    ROS Review of Systems  Constitutional:  Positive for fatigue. Negative for chills and fever.  HENT:  Negative for congestion and sore throat.        B/l ear discomfort  Eyes:  Negative for pain and discharge.  Respiratory:  Negative for cough and shortness of breath.    Cardiovascular:  Negative for chest pain and palpitations.  Gastrointestinal:  Negative for diarrhea, nausea and vomiting.  Endocrine: Negative for polydipsia and polyuria.  Genitourinary:  Positive for urgency. Negative for dysuria and hematuria.       Nocturia  Musculoskeletal:  Negative for neck pain and neck stiffness.  Skin:  Negative for rash.  Neurological:  Positive  for dizziness. Negative for weakness, numbness and headaches.  Psychiatric/Behavioral:  Negative for agitation and behavioral problems. The patient is nervous/anxious.       Objective:    Physical Exam Vitals reviewed.  Constitutional:      General: He is not in acute distress.    Appearance: He is not diaphoretic.  HENT:     Head: Normocephalic and atraumatic.     Nose: Nose normal.     Mouth/Throat:     Mouth: Mucous membranes are moist.  Eyes:     General: No scleral icterus.    Extraocular Movements: Extraocular movements intact.  Cardiovascular:     Rate and Rhythm: Normal rate and regular rhythm.     Pulses: Normal pulses.     Heart sounds: Normal heart sounds. No murmur heard. Pulmonary:     Breath sounds: Normal breath sounds. No wheezing or rales.  Abdominal:     Palpations: Abdomen is soft.     Tenderness: There is no abdominal tenderness.  Musculoskeletal:     Cervical back: Neck supple. No tenderness.     Right lower leg: No edema.     Left lower leg: No edema.  Skin:    General: Skin is warm.     Findings: Lesion (Actinic keratoses - over right arm near elbow and hip area) present. No rash.  Neurological:     General: No focal deficit present.     Mental Status: He is alert and oriented to person, place, and time.     Cranial Nerves: No cranial nerve deficit.     Sensory: No sensory deficit.     Motor: No weakness.  Psychiatric:        Mood and Affect: Mood normal.        Behavior: Behavior normal.     BP 130/68 (BP Location: Left Arm, Patient Position: Sitting, Cuff Size:  Normal)   Pulse 84   Ht '5\' 10"'$  (1.778 m)   Wt 160 lb (72.6 kg)   SpO2 98%   BMI 22.96 kg/m  Wt Readings from Last 3 Encounters:  08/31/22 160 lb (72.6 kg)  07/20/22 160 lb 9.6 oz (72.8 kg)  06/03/22 157 lb (71.2 kg)    Lab Results  Component Value Date   TSH 4.580 (H) 07/21/2022   Lab Results  Component Value Date   WBC 6.9 07/21/2022   HGB 14.3 07/21/2022   HCT 42.9 07/21/2022   MCV 97 07/21/2022   PLT 207 07/21/2022   Lab Results  Component Value Date   NA 137 07/21/2022   K 5.0 07/21/2022   CO2 23 07/21/2022   GLUCOSE 101 (H) 07/21/2022   BUN 16 07/21/2022   CREATININE 0.83 07/21/2022   BILITOT 0.5 07/21/2022   ALKPHOS 77 07/21/2022   AST 32 07/21/2022   ALT 32 07/21/2022   PROT 6.9 07/21/2022   ALBUMIN 4.4 07/21/2022   CALCIUM 9.4 07/21/2022   EGFR 89 07/21/2022   Lab Results  Component Value Date   CHOL 224 (H) 07/21/2022   Lab Results  Component Value Date   HDL 54 07/21/2022   Lab Results  Component Value Date   LDLCALC 146 (H) 07/21/2022   Lab Results  Component Value Date   TRIG 136 07/21/2022   Lab Results  Component Value Date   CHOLHDL 4.1 07/21/2022   Lab Results  Component Value Date   HGBA1C 5.6 07/21/2022      Assessment & Plan:   Problem List Items Addressed  This Visit       Endocrine   Hypothyroidism    Lab Results  Component Value Date   TSH 4.580 (H) 07/21/2022  With normal free T4 Started Levothyroxine 25 mcg QD as he has symptoms - chronic fatigue and memory changes Check TSH and free T4 after 3 months      Relevant Medications   levothyroxine (SYNTHROID) 25 MCG tablet   Other Relevant Orders   TSH + free T4     Other   HLD (hyperlipidemia)    Last lipid profile reviewed Had new onset hypothyroidism Recently started Levothyroxine for hypothyroidism Advised to follow low cholesterol diet for now      Relevant Orders   Lipid Profile   Benign prostatic hyperplasia with nocturia    On Flomax UA  reviewed Followed by Urology      GAD (generalized anxiety disorder) - Primary    Had anxiety and insomnia On Trazodone 50 mg qHS PRN for insomnia, now improved Sleep hygiene discussed      Relevant Medications   traZODone (DESYREL) 50 MG tablet    Meds ordered this encounter  Medications   traZODone (DESYREL) 50 MG tablet    Sig: Take 1 tablet (50 mg total) by mouth at bedtime.    Dispense:  30 tablet    Refill:  3   levothyroxine (SYNTHROID) 25 MCG tablet    Sig: Take 1 tablet (25 mcg total) by mouth daily.    Dispense:  90 tablet    Refill:  0    Follow-up: Return in about 4 months (around 12/31/2022) for Hypothyroidism and insomnia.    Lindell Spar, MD

## 2022-08-31 NOTE — Assessment & Plan Note (Signed)
On Flomax UA reviewed Followed by Urology

## 2022-08-31 NOTE — Assessment & Plan Note (Addendum)
Lab Results  Component Value Date   TSH 4.580 (H) 07/21/2022   With normal free T4 Started Levothyroxine 25 mcg QD as he has symptoms - chronic fatigue and memory changes Check TSH and free T4 after 3 months

## 2022-08-31 NOTE — Assessment & Plan Note (Signed)
Last lipid profile reviewed Had new onset hypothyroidism Recently started Levothyroxine for hypothyroidism Advised to follow low cholesterol diet for now

## 2022-08-31 NOTE — Patient Instructions (Addendum)
Please continue taking Trazodone as prescribed.  Please continue to follow low salt diet and ambulate as tolerated.  Please get blood tests done before the next visit.

## 2022-09-01 ENCOUNTER — Telehealth: Payer: Self-pay | Admitting: Internal Medicine

## 2022-09-01 NOTE — Telephone Encounter (Signed)
Spoke to daughter.

## 2022-09-01 NOTE — Telephone Encounter (Signed)
Pt daughter called stating he has been taking laxatives for 3 day. He was seen yesterday & didn't mention it. States he is dizzy when he is trying to go & he has tore his rectum area straining. States he just called her very winded after trying to go to the bathroom. Wants to know what to do?

## 2022-10-05 ENCOUNTER — Encounter: Payer: Self-pay | Admitting: Urology

## 2022-10-05 ENCOUNTER — Ambulatory Visit: Payer: Medicare HMO | Admitting: Urology

## 2022-10-05 VITALS — BP 134/75 | HR 71 | Ht 70.0 in | Wt 160.0 lb

## 2022-10-05 DIAGNOSIS — N401 Enlarged prostate with lower urinary tract symptoms: Secondary | ICD-10-CM

## 2022-10-05 DIAGNOSIS — R3915 Urgency of urination: Secondary | ICD-10-CM

## 2022-10-05 DIAGNOSIS — R351 Nocturia: Secondary | ICD-10-CM

## 2022-10-05 LAB — URINALYSIS, ROUTINE W REFLEX MICROSCOPIC
Bilirubin, UA: NEGATIVE
Glucose, UA: NEGATIVE
Ketones, UA: NEGATIVE
Nitrite, UA: NEGATIVE
Protein,UA: NEGATIVE
Specific Gravity, UA: 1.015 (ref 1.005–1.030)
Urobilinogen, Ur: 0.2 mg/dL (ref 0.2–1.0)
pH, UA: 6 (ref 5.0–7.5)

## 2022-10-05 LAB — MICROSCOPIC EXAMINATION: Bacteria, UA: NONE SEEN

## 2022-10-05 MED ORDER — MIRABEGRON ER 50 MG PO TB24: 50.0000 mg | ORAL_TABLET | Freq: Every day | ORAL | 11 refills | Status: DC

## 2022-10-05 MED ORDER — TAMSULOSIN HCL 0.4 MG PO CAPS: 0.4000 mg | ORAL_CAPSULE | Freq: Every day | ORAL | 11 refills | Status: DC

## 2022-10-05 NOTE — Progress Notes (Signed)
10/05/2022 10:43 AM   Scott Chang 05/08/1941 [id number]  Referring provider: Anabel Halon, MD [address] [place],  [place] [address]  Followup urinary urgency   HPI: Scott Chang is a 82yo here for followup for BPH and urinary urgency. IPSS 14 QOL 2 on flomax. He tried mirabegron last viist which improved his urgency and 3 weeks on the medication. Urine stream strong. No straining to urinate. No UTIs since last visit.    PMH: Past Medical History:  Diagnosis Date   Anxiety    Arthritis    Colon polyps    Dyspnea on exertion 04/18/2019   HLD (hyperlipidemia) 04/18/2019   Hypercholesteremia    Malaise and fatigue 04/18/2019   Neuropathy     Surgical History: Past Surgical History:  Procedure Laterality Date   APPENDECTOMY     CHOLECYSTECTOMY     COLONOSCOPY N/A 05/09/2013   Procedure: COLONOSCOPY;  Surgeon: Malissa Hippo, MD;  Location: [facility];  Service: Endoscopy;  Laterality: N/A;  1030   COLONOSCOPY W/ BIOPSIES AND POLYPECTOMY     Left knee arthroscopy     Left shoulder arthroscopy     X 2    Home Medications:  Allergies as of 10/05/2022       Reactions   Bee Venom Anaphylaxis   Penicillins Itching, Rash        Medication List        Accurate as of October 05, 2022 10:43 AM. If you have any questions, ask your nurse or doctor.          augmented betamethasone dipropionate 0.05 % cream Commonly known as: DIPROLENE-AF Apply topically.   bacitracin-polymyxin b ophthalmic ointment Commonly known as: POLYSPORIN   clobetasol cream 0.05 % Commonly known as: TEMOVATE Apply 1 Application topically 2 (two) times daily.   donepezil 5 MG tablet Commonly known as: ARICEPT Take 1 tablet (5 mg total) by mouth at bedtime.   EPINEPHrine 0.3 mg/0.3 mL Soaj injection Commonly known as: EPI-PEN Inject 0.3 mg into the muscle as needed for anaphylaxis.   erythromycin ophthalmic ointment at bedtime.   levothyroxine 25 MCG tablet Commonly  known as: SYNTHROID Take 1 tablet (25 mcg total) by mouth daily.   meclizine 25 MG tablet Commonly known as: ANTIVERT Take 1 tablet (25 mg total) by mouth 2 (two) times daily as needed for dizziness.   memantine 5 MG tablet Commonly known as: NAMENDA Take 1 tablet (5 mg total) by mouth 2 (two) times daily.   mirabegron ER 25 MG Tb24 tablet Commonly known as: MYRBETRIQ Take 1 tablet (25 mg total) by mouth daily.   multivitamin with minerals Tabs tablet Take 1 tablet by mouth daily.   SYSTANE COMPLETE OP Apply to eye.   tamsulosin 0.4 MG Caps capsule Commonly known as: FLOMAX Take 1 capsule (0.4 mg total) by mouth daily after supper.   traZODone 50 MG tablet Commonly known as: DESYREL Take 1 tablet (50 mg total) by mouth at bedtime.        Allergies:  Allergies  Allergen Reactions   Bee Venom Anaphylaxis   Penicillins Itching and Rash    Family History: Family History  Problem Relation Age of Onset   Chronic Renal Failure Mother    Heart attack Father    Cancer Brother    Osteoarthritis Daughter    Colon cancer Neg Hx     Social History:  reports that he quit smoking about 34 years ago. His smoking use  included cigarettes. He has a 40.00 pack-year smoking history. He has never used smokeless tobacco. He reports that he does not currently use alcohol. He reports that he does not use drugs.  ROS: All other review of systems were reviewed and are negative except what is noted above in HPI  Physical Exam: BP 134/75   Pulse 71   Ht  (1.778 m)   Wt 160 lb (72.6 kg)   BMI 22.96 kg/m   Constitutional:  Alert and oriented, No acute distress. HEENT: [place] AT, moist mucus membranes.  Trachea midline, no masses. Cardiovascular: No clubbing, cyanosis, or edema. Respiratory: Normal respiratory effort, no increased work of breathing. GI: Abdomen is soft, nontender, nondistended, no abdominal masses GU: No CVA tenderness.  Lymph: No cervical or inguinal  lymphadenopathy. Skin: No rashes, bruises or suspicious lesions. Neurologic: Grossly intact, no focal deficits, moving all 4 extremities. Psychiatric: Normal mood and affect.  Laboratory Data: Lab Results  Component Value Date   WBC 6.9 07/21/2022   HGB 14.3 07/21/2022   HCT 42.9 07/21/2022   MCV 97 07/21/2022   PLT 207 07/21/2022    Lab Results  Component Value Date   CREATININE 0.83 07/21/2022    Lab Results  Component Value Date   PSA 0.2 10/09/2019    Lab Results  Component Value Date   TESTOSTERONE 2,515 (H) 08/12/2020    Lab Results  Component Value Date   HGBA1C 5.6 07/21/2022    Urinalysis    Component Value Date/Time   BILIRUBINUR negative 09/21/2021 1054   KETONESUR negative 09/21/2021 1054   UROBILINOGEN 0.2 09/21/2021 1054   NITRITE Negative 09/21/2021 1054   LEUKOCYTESUR Negative 09/21/2021 1054    No results found for: "LABMICR", "WBCUA", "RBCUA", "LABEPIT", "MUCUS", "BACTERIA"  Pertinent Imaging:  No results found for this or any previous visit.  No results found for this or any previous visit.  No results found for this or any previous visit.  No results found for this or any previous visit.  No results found for this or any previous visit.  No valid procedures specified. No results found for this or any previous visit.  No results found for this or any previous visit.   Assessment & Plan:    1. Benign prostatic hyperplasia with nocturia -flomax 0.4mg  daily - Urinalysis, Routine w reflex microscopic  2. Urinary urgency Mirabegron  daily   No follow-ups on file.  Wilkie Aye, MD  [facility] [place]

## 2022-10-05 NOTE — Patient Instructions (Signed)

## 2022-12-05 ENCOUNTER — Encounter: Payer: Self-pay | Admitting: Physician Assistant

## 2022-12-05 ENCOUNTER — Ambulatory Visit: Payer: Medicare HMO | Admitting: Physician Assistant

## 2022-12-05 VITALS — BP 130/70 | HR 71 | Resp 18 | Ht 70.0 in | Wt 159.0 lb

## 2022-12-05 DIAGNOSIS — R413 Other amnesia: Secondary | ICD-10-CM

## 2022-12-05 NOTE — Progress Notes (Addendum)
Assessment/Plan:   Memory Impairment of unclear etiology  Scott Chang is a very pleasant 83 y.o. RH male  with  a history of  hyperlipidemia,Vit D deficiency,  OA/DDD of the L spine, peripheral neuropathy, history of BPPV seen today in follow up for memory loss. Patient was on memantine 5 mg bid but discontinued it 2 months ago because "was not doing anything". He was educated again on the role of this medicine, which role is of slowing down cognitive decline. Patient is able to participate on his IADLs.  Continues to drive .He is scheduled for Neuropsych testing in 01/2023 .       Recommendations   Follow up in 6  months. Restart Memantine 5 mg  nightly. Side effects were discussed  Patient is scheduled for Neuropsych testing for clarity of diagnosis and disease trajectory (01/2023)  Recommend good control of her cardiovascular risk factors Continue to control mood as per PCP    Subjective:    This patient is accompanied in the office by his daughter who supplements the history.  Previous records as well as any outside records available were reviewed prior to todays visit. Patient was initially seen on  05/02/22 with [facility] 18/30    Any changes in memory since last visit? Patient has some difficulty remembering recent conversations and people names. Continues to work on his vehicles but "sometimes he cannot find a tool that he should know how to use"-daughter says.   repeats oneself?  "On occasions, not all the time".  Disoriented when walking into a room?  Patient denies except occasionally not remembering what he came to the room for    Leaving objects in unusual places? May misplace the phone or keys, but not in unusual places. "He lost the wallet 3 times"-daughter says .  Wandering behavior?  denies   Any personality changes since last visit?  denies   Any worsening depression?:  denies, but he is still dealing with sadness due to death of his wife. Hallucinations or paranoia?   denies   Seizures?    denies    Any sleep changes?  He has some insomnia but since trazodone he is "sleeping a little better".  Denies vivid dreams, REM behavior or sleepwalking   Sleep apnea?   denies   Any hygiene concerns?    denies   Independent of bathing and dressing?  Endorsed  Does the patient needs help with medications?  Patient is in charge. Stopped it 2 months ago memantine as above.   Who is in charge of the finances?  Patient is in charge     Any changes in appetite?  "About normal".  Patient have trouble swallowing?  denies   Does the patient cook?  No   Any headaches?   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 No photophobia.   Chronic back pain  denies   Ambulates with difficulty?    denies   Recent falls or head injuries? denies     Unilateral weakness, numbness or tingling?  denies   Any tremors?  denies   Any anosmia?  Patient denies   Any incontinence of urine?  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 Any bowel dysfunction?   denies      Patient lives since the death of his wife in 05/13/2021  Does the patient drive?  Yes, "I am a good driver".      Initial  visit 05/02/2022 How long did patient have memory difficulties? For the last  3-4 years, when he began to  notice difficulty remembering recent conversations and names of people. Initially this was attributed to age but since his wife death 1 year ago, his memory worsened and now he is "not sure if grief contributed to memory or not, but it is worse"-he says. . Able to do ADLs and likes to work on his vehicles an does acres or yard work. He does not like to do any crossword puzzles or word finding. repeats oneself?" Not so much of that " Disoriented when walking into a room?  Patient denies, but at times he forgets what he came to the room for Leaving objects in unusual places?  Patient denies, but does forget where he left common objects such as remote,  keys and phone.   Patient lives  alone since his wife died on 27-May-2021. Ambulates  with difficulty?   Patient denies   Recent falls?  Patient denies   Any head injuries?  Patient denies   History of seizures?   Patient denies   Wandering behavior?  Patient denies   Patient drives?  No issues. "I still do a great job".  Any mood changes ? He denies Any depression?:  Patient denies a history of, but his wife died 1 year ago and at times he feels sad, he is dealing with grief.  Hallucinations?  Patient denies   Paranoia?  Patient denies   Denies vivid dreams, REM behavior or sleepwalking. He has insomnia, does not feel rested in most instances .    History of sleep apnea?  Patient denies   Any hygiene concerns?  Patient denies   Independent of bathing and dressing?  Endorsed  Does the patient needs help with medications? Patient in charge  Occasionally misses doses  Who is in charge of the finances?  Patient  is in charge   Any changes in appetite?  Patient denies   Trying to eliminate some foods of the diet due to skin rashes followed by Dermatology Patient have trouble swallowing? Patient denies   Does the patient cook?  Patient denies   Any kitchen accidents such as leaving the stove on? Patient denies   Any headaches?  Patient denies   Double vision? Patient denies   Any focal numbness or tingling?  Patient denies   Chronic back pain Patient denies   Unilateral weakness?  Patient denies   Any tremors?  Patient denies   Any history of anosmia?  Patient denies   Any incontinence of urine? Urgency incontinence and BPH, on Ditropan Any bowel dysfunction?   Patient denies   History of heavy alcohol intake?  Patient denies   History of heavy tobacco use?  Patient denies   Family history of dementia?   Unknown, parents died very young. Alzheimer's disease     Labs: TSH  6.5, nl T4 1.06  05/27/2022 MRI brain, personally reviewed, remarkable for sequela of mild chronic microvascular  ischemic changes with age-appropriate volume loss without lobar predominance.  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   PREVIOUS MEDICATIONS: donepezil 5 mg , dizziness and insomnia.  CURRENT MEDICATIONS:  Outpatient Encounter Medications as of 12/05/2022  Medication Sig   augmented betamethasone dipropionate (DIPROLENE-AF) 0.05 % cream Apply topically.   bacitracin-polymyxin b (POLYSPORIN) ophthalmic ointment    clobetasol cream (TEMOVATE) 0.05 % Apply 1  Application topically 2 (two) times daily.   donepezil (ARICEPT) 5 MG tablet Take 1 tablet (5 mg total) by mouth at bedtime.   levothyroxine (SYNTHROID) 25 MCG tablet Take 1 tablet (25 mcg total) by mouth daily.   meclizine (ANTIVERT) 25 MG tablet Take 1 tablet (25 mg total) by mouth 2 (two) times daily as needed for dizziness.   mirabegron ER (MYRBETRIQ) 50 MG TB24 tablet Take 1 tablet (50 mg total) by mouth daily.   Multiple Vitamin (MULTIVITAMIN WITH MINERALS) TABS tablet Take 1 tablet by mouth daily.   Propylene Glycol (SYSTANE COMPLETE OP) Apply to eye.   tamsulosin (FLOMAX) 0.4 MG CAPS capsule Take 1 capsule (0.4 mg total) by mouth daily after supper.   traZODone (DESYREL) 50 MG tablet Take 1 tablet (50 mg total) by mouth at bedtime.   EPINEPHrine 0.3 mg/0.3 mL IJ SOAJ injection Inject 0.3 mg into the muscle as needed for anaphylaxis. (Patient not taking: Reported on 12/05/2022)   erythromycin ophthalmic ointment at bedtime. (Patient not taking: Reported on 12/05/2022)   memantine (NAMENDA) 5 MG tablet Take 1 tablet (5 mg total) by mouth 2 (two) times daily. (Patient not taking: Reported on 12/05/2022)   No facility-administered encounter medications on file as of 12/05/2022.       12/05/2022   12:00 PM  MMSE - Mini Mental State Exam  Orientation to time 5  Orientation to Place 4  Registration 3  Attention/ Calculation 4  Recall 0  Language-  name 2 objects 2  Language- repeat 1  Language- follow 3 step command 3  Language- read & follow direction 1  Write a sentence 1  Copy design 0  Total score 24      05/03/2022    9:52 AM 05/02/2022    8:00 AM  Montreal Cognitive Assessment   Visuospatial/ Executive (0/5) 1 1  Naming (0/3) 3 3  Attention: Read list of digits (0/2) 2 2  Attention: Read list of letters (0/1) 1 1  Attention: Serial 7 subtraction starting at 100 (0/3) 1 1  Language: Repeat phrase (0/2) 2 2  Language : Fluency (0/1) 0 0  Abstraction (0/2) 1 1  Delayed Recall (0/5) 0 0  Orientation (0/6) 6 6  Total 17 17  Adjusted Score (based on education) 18 17    Objective:     PHYSICAL EXAMINATION:    VITALS:   Vitals:   12/05/22 0846  BP: 130/70  Pulse: 71  Resp: 18  SpO2: 98%  Weight: 159 lb (72.1 kg)  Height: 5\' 10"  (1.778 m)    GEN:  The patient appears stated age and is in NAD. HEENT:  Normocephalic, atraumatic.   Neurological examination:  General: NAD, well-groomed, appears stated age. Orientation: The patient is alert. Oriented to person, place and date Cranial nerves: There is good facial symmetry.The speech is fluent and clear. No aphasia or dysarthria. Fund of knowledge is appropriate. Recent and remote memory are impaired. Attention and concentration are reduced.  Able to name objects and repeat phrases.  Hearing is intact to conversational tone.   Sensation: Sensation is intact to light touch throughout Motor: Strength is at least antigravity x4. DTR's 2/4 in UE/LE     Movement examination: Tone: There is normal tone in the UE/LE Abnormal movements:  no tremor.  No myoclonus.  No asterixis.   Coordination:  There is no decremation with RAM's. Normal finger to nose  Gait and Station: The patient has no difficulty arising out of a  deep-seated chair without the use of the hands. The patient's stride length is good.  Gait is cautious and narrow.    Thank you for allowing [place] the  opportunity to participate in the care of this nice patient. Please do not hesitate to contact us for any questions or concerns.   Total time spent on today's visit was 34 minutes dedicated to this patient today, preparing to see patient, examining the patient, ordering tests and/or medications and counseling the patient, documenting clinical information in the EHR or other health record, independently interpreting results and communicating results to the patient/family, discussing treatment and goals, answering patient's questions and coordinating care.  Cc:  Anabel Halon, MD  Marlowe Kays 12/05/2022 5:14 PM

## 2022-12-05 NOTE — Patient Instructions (Addendum)
It was a pleasure to see you today at our office.   Recommendations:  Follow up in 6   months Resume memantine 5 mg daily  Neuropsych testing for clarity of diagnosis and disease trajectory  scheduled for 01/2023      For assessment of decision of mental capacity and competency:  Call Dr. Erick Blinks, geriatric psychiatrist at [phone] Counseling regarding caregiver distress,  including caregiver depression, anxiety and issues regarding community resources, adult day care programs, adult living facilities, or memory care questions:  please contact your  Primary Doctor's Social Worker   Whom to call: Memory  decline, memory medications: Call our office [phone]   For psychiatric meds, mood meds: Please have your primary care physician manage these medications.  If you have any severe symptoms of a stroke, or other severe issues such as confusion,severe chills or fever, etc call 911 or go to the ER as you may need to be evaluated further       RECOMMENDATIONS FOR ALL PATIENTS WITH MEMORY PROBLEMS: 1. Continue to exercise (Recommend 30 minutes of walking everyday, or 3 hours every week) 2. Increase social interactions - continue going to [place] and enjoy social gatherings with friends and family 3. Eat healthy, avoid fried foods and eat more fruits and vegetables 4. Maintain adequate blood pressure, blood sugar, and blood cholesterol level. Reducing the risk of stroke and cardiovascular disease also helps promoting better memory. 5. Avoid stressful situations. Live a simple life and avoid aggravations. Organize your time and prepare for the next day in anticipation. 6. Sleep well, avoid any interruptions of sleep and avoid any distractions in the bedroom that may interfere with adequate sleep quality 7. Avoid sugar, avoid sweets as there is a strong link between excessive sugar intake, diabetes, and cognitive impairment We discussed the Mediterranean diet, which has been shown  to help patients reduce the risk of progressive memory disorders and reduces cardiovascular risk. This includes eating fish, eat fruits and green leafy vegetables, nuts like almonds and hazelnuts, walnuts, and also use olive oil. Avoid fast foods and fried foods as much as possible. Avoid sweets and sugar as sugar use has been linked to worsening of memory function.  There is always a concern of gradual progression of memory problems. If this is the case, then we may need to adjust level of care according to patient needs. Support, both to the patient and caregiver, should then be put into place.    FALL PRECAUTIONS: Be cautious when walking. Scan the area for obstacles that may increase the risk of trips and falls. When getting up in the mornings, sit up at the edge of the bed for a few minutes before getting out of bed. Consider elevating the bed at the head end to avoid drop of blood pressure when getting up. Walk always in a well-lit room (use night lights in the walls). Avoid area rugs or power cords from appliances in the middle of the walkways. Use a walker or a cane if necessary and consider physical therapy for balance exercise. Get your eyesight checked regularly.  FINANCIAL OVERSIGHT: Supervision, especially oversight when making financial decisions or transactions is also recommended.  HOME SAFETY: Consider the safety of the kitchen when operating appliances like stoves, microwave oven, and blender. Consider having supervision and share cooking responsibilities until no longer able to participate in those. Accidents with firearms and other hazards in the house should be identified and addressed as well.   ABILITY TO BE  LEFT ALONE: If patient is unable to contact 911 operator, consider using [employer], or when the need is there, arrange for someone to stay with patients. Smoking is a fire hazard, consider supervision or cessation. Risk of wandering should be assessed by caregiver and if  detected at any point, supervision and safe proof recommendations should be instituted.  MEDICATION SUPERVISION: Inability to self-administer medication needs to be constantly addressed. Implement a mechanism to ensure safe administration of the medications.   DRIVING: Regarding driving, in patients with progressive memory problems, driving will be impaired. We advise to have someone else do the driving if trouble finding directions or if minor accidents are reported. Independent driving assessment is available to determine safety of driving.   If you are interested in the driving assessment, you can contact the following:  [employer] in [place] [phone]  [facility] [phone]  [facility] [phone]  [facility] [phone] or [phone]

## 2022-12-21 DIAGNOSIS — E039 Hypothyroidism, unspecified: Secondary | ICD-10-CM | POA: Diagnosis not present

## 2022-12-21 DIAGNOSIS — E782 Mixed hyperlipidemia: Secondary | ICD-10-CM | POA: Diagnosis not present

## 2022-12-22 LAB — LIPID PANEL
Chol/HDL Ratio: 5.2 ratio — ABNORMAL HIGH (ref 0.0–5.0)
Cholesterol, Total: 239 mg/dL — ABNORMAL HIGH (ref 100–199)
HDL: 46 mg/dL (ref >39)
LDL Chol Calc (NIH): 142 mg/dL — ABNORMAL HIGH (ref 0–99)
Triglycerides: 282 mg/dL — ABNORMAL HIGH (ref 0–149)
VLDL Cholesterol Cal: 51 mg/dL — ABNORMAL HIGH (ref 5–40)

## 2022-12-22 LAB — TSH+FREE T4
Free T4: 1.09 ng/dL (ref 0.82–1.77)
TSH: 4 u[IU]/mL (ref 0.450–4.500)

## 2023-01-04 ENCOUNTER — Ambulatory Visit (INDEPENDENT_AMBULATORY_CARE_PROVIDER_SITE_OTHER): Payer: Medicare HMO | Admitting: Internal Medicine

## 2023-01-04 ENCOUNTER — Encounter: Payer: Self-pay | Admitting: Internal Medicine

## 2023-01-04 VITALS — BP 132/64 | HR 76 | Ht 70.5 in | Wt 162.4 lb

## 2023-01-04 DIAGNOSIS — E039 Hypothyroidism, unspecified: Secondary | ICD-10-CM

## 2023-01-04 DIAGNOSIS — F411 Generalized anxiety disorder: Secondary | ICD-10-CM | POA: Diagnosis not present

## 2023-01-04 DIAGNOSIS — R351 Nocturia: Secondary | ICD-10-CM

## 2023-01-04 DIAGNOSIS — F0280 Dementia in other diseases classified elsewhere without behavioral disturbance: Secondary | ICD-10-CM

## 2023-01-04 DIAGNOSIS — R5382 Chronic fatigue, unspecified: Secondary | ICD-10-CM

## 2023-01-04 DIAGNOSIS — N401 Enlarged prostate with lower urinary tract symptoms: Secondary | ICD-10-CM | POA: Diagnosis not present

## 2023-01-04 DIAGNOSIS — G309 Alzheimer's disease, unspecified: Secondary | ICD-10-CM

## 2023-01-04 MED ORDER — LEVOTHYROXINE SODIUM 25 MCG PO TABS
25.0000 ug | ORAL_TABLET | Freq: Every day | ORAL | 1 refills | Status: DC
Start: 2023-01-04 — End: 2023-06-19

## 2023-01-04 MED ORDER — TRAZODONE HCL 50 MG PO TABS: 50.0000 mg | ORAL_TABLET | Freq: Every day | ORAL | 3 refills | Status: DC

## 2023-01-04 NOTE — Progress Notes (Signed)
Established Patient Office Visit  Subjective:  Patient ID: Scott Chang, male    DOB: 06-Jun-1941  Age: 82 y.o. MRN: [id number]  CC:  Chief Complaint  Patient presents with   Anxiety    Four month follow up     HPI Scott Chang is a 82 y.o. male with past medical history of OA, DDD of lumbar spine and BPPV who presents for f/u of GAD and insomnia.  GAD: He was having constant anxiety at night time and insomnia.  He was given trazodone 50 mg nightly, which has improved his sleep quality.  He still wakes up at nighttime for urinating.  His anxiety and fatigue have also slightly improved due to better sleep quality.  Denies any SI or HI currently.  Hypothyroidism: He was placed on levothyroxine 25 mcg QD.  He has seen slight improvement in fatigue.  Denies any recent change in weight or appetite.  HLD: His cholesterol level is still elevated.  He has been following low cholesterol diet.  He is physically active as well.  Past Medical History:  Diagnosis Date   Anxiety    Arthritis    Colon polyps    Dyspnea on exertion 04/18/2019   HLD (hyperlipidemia) 04/18/2019   Hypercholesteremia    Malaise and fatigue 04/18/2019   Neuropathy     Past Surgical History:  Procedure Laterality Date   APPENDECTOMY     CHOLECYSTECTOMY     COLONOSCOPY N/A 05/09/2013   Procedure: COLONOSCOPY;  Surgeon: Malissa Hippo, MD;  Location: [facility];  Service: Endoscopy;  Laterality: N/A;  1030   COLONOSCOPY W/ BIOPSIES AND POLYPECTOMY     Left knee arthroscopy     Left shoulder arthroscopy     X 2    Family History  Problem Relation Age of Onset   Chronic Renal Failure Mother    Heart attack Father    Cancer Brother    Osteoarthritis Daughter    Colon cancer Neg Hx     Social History   Socioeconomic History   Marital status: Married    Spouse name: Not on file   Number of children: Not on file   Years of education: 1   Highest education level: Not on file  Occupational  History   Not on file  Tobacco Use   Smoking status: Former    Current packs/day: 0.00    Average packs/day: 1 pack/day for 40.0 years (40.0 ttl pk-yrs)    Types: Cigarettes    Start date: 06/21/1948    Quit date: 06/21/1988    Years since quitting: 34.5   Smokeless tobacco: Never  Vaping Use   Vaping status: Never Used  Substance and Sexual Activity   Alcohol use: Not Currently   Drug use: No   Sexual activity: Not on file  Other Topics Concern   Not on file  Social History Narrative   Married for 52 years.Retired ,previously in Holiday representative.   Right handed    Drinks caffeine   One floor home   [place]   Social Determinants of Health   Financial Resource Strain: Low Risk  (01/17/2022)   Overall Financial Resource Strain (CARDIA)    Difficulty of Paying Living Expenses: Not hard at all  Food Insecurity: No Food Insecurity (01/17/2022)   Hunger Vital Sign    Worried About Running Out of Food in the Last Year: Never true    Ran Out of Food in the Last Year: Never true  Transportation Needs: No Transportation Needs (01/17/2022)   PRAPARE - Administrator, Civil Service (Medical): No    Lack of Transportation (Non-Medical): No  Physical Activity: Sufficiently Active (01/17/2022)   Exercise Vital Sign    Days of Exercise per Week: 5 days    Minutes of Exercise per Session: 140 min  Stress: Stress Concern Present (01/17/2022)   [employer] of Occupational Health - Occupational Stress Questionnaire    Feeling of Stress : To some extent  Social Connections: Socially Isolated (01/17/2022)   Social Connection and Isolation Panel [NHANES]    Frequency of Communication with Friends and Family: More than three times a week    Frequency of Social Gatherings with Friends and Family: More than three times a week    Attends Religious Services: Never    Database administrator or Organizations: No    Attends Banker Meetings: Never    Marital Status:  Widowed  Intimate Partner Violence: Not At Risk (01/17/2022)   Humiliation, Afraid, Rape, and Kick questionnaire    Fear of Current or Ex-Partner: No    Emotionally Abused: No    Physically Abused: No    Sexually Abused: No    Outpatient Medications Prior to Visit  Medication Sig Dispense Refill   augmented betamethasone dipropionate (DIPROLENE-AF) 0.05 % cream Apply topically.     bacitracin-polymyxin b (POLYSPORIN) ophthalmic ointment      clobetasol cream (TEMOVATE) 0.05 % Apply 1 Application topically 2 (two) times daily. 30 g 0   donepezil (ARICEPT) 5 MG tablet Take 1 tablet (5 mg total) by mouth at bedtime. 30 tablet 11   EPINEPHrine 0.3 mg/0.3 mL IJ SOAJ injection Inject 0.3 mg into the muscle as needed for anaphylaxis. (Patient not taking: Reported on 12/05/2022) 1 each 1   erythromycin ophthalmic ointment at bedtime. (Patient not taking: Reported on 12/05/2022)     meclizine (ANTIVERT) 25 MG tablet Take 1 tablet (25 mg total) by mouth 2 (two) times daily as needed for dizziness. 30 tablet 1   memantine (NAMENDA) 5 MG tablet Take 1 tablet (5 mg total) by mouth 2 (two) times daily. (Patient not taking: Reported on 12/05/2022) 60 tablet 11   mirabegron ER (MYRBETRIQ) 50 MG TB24 tablet Take 1 tablet (50 mg total) by mouth daily. 30 tablet 11   Multiple Vitamin (MULTIVITAMIN WITH MINERALS) TABS tablet Take 1 tablet by mouth daily.     Propylene Glycol (SYSTANE COMPLETE OP) Apply to eye.     tamsulosin (FLOMAX) 0.4 MG CAPS capsule Take 1 capsule (0.4 mg total) by mouth daily after supper. 30 capsule 11   levothyroxine (SYNTHROID) 25 MCG tablet Take 1 tablet (25 mcg total) by mouth daily. 90 tablet 0   traZODone (DESYREL) 50 MG tablet Take 1 tablet (50 mg total) by mouth at bedtime. 30 tablet 3   No facility-administered medications prior to visit.    Allergies  Allergen Reactions   Bee Venom Anaphylaxis   Penicillins Itching and Rash    ROS Review of Systems  Constitutional:   Positive for fatigue. Negative for chills and fever.  HENT:  Negative for congestion and sore throat.        B/l ear discomfort  Eyes:  Negative for pain and discharge.  Respiratory:  Negative for cough and shortness of breath.   Cardiovascular:  Negative for chest pain and palpitations.  Gastrointestinal:  Negative for diarrhea, nausea and vomiting.  Endocrine: Negative for polydipsia and polyuria.  Genitourinary:  Positive for urgency. Negative for dysuria and hematuria.       Nocturia  Musculoskeletal:  Negative for neck pain and neck stiffness.  Skin:  Negative for rash.  Neurological:  Negative for weakness, numbness and headaches.  Psychiatric/Behavioral:  Negative for agitation and behavioral problems. The patient is nervous/anxious.       Objective:    Physical Exam Vitals reviewed.  Constitutional:      General: He is not in acute distress.    Appearance: He is not diaphoretic.  HENT:     Head: Normocephalic and atraumatic.     Nose: Nose normal.     Mouth/Throat:     Mouth: Mucous membranes are moist.  Eyes:     General: No scleral icterus.    Extraocular Movements: Extraocular movements intact.  Cardiovascular:     Rate and Rhythm: Normal rate and regular rhythm.     Pulses: Normal pulses.     Heart sounds: Normal heart sounds. No murmur heard. Pulmonary:     Breath sounds: Normal breath sounds. No wheezing or rales.  Abdominal:     Palpations: Abdomen is soft.     Tenderness: There is no abdominal tenderness.  Musculoskeletal:     Cervical back: Neck supple. No tenderness.     Right lower leg: No edema.     Left lower leg: No edema.  Skin:    General: Skin is warm.     Findings: Lesion (Actinic keratoses - over right arm near elbow and hip area) present. No rash.  Neurological:     General: No focal deficit present.     Mental Status: He is alert and oriented to person, place, and time.     Cranial Nerves: No cranial nerve deficit.     Sensory: No  sensory deficit.     Motor: No weakness.  Psychiatric:        Mood and Affect: Mood normal.        Behavior: Behavior normal.     BP 132/64 (BP Location: Right Arm, Patient Position: Sitting, Cuff Size: Normal)   Pulse 76   Ht 5' 10.5" (1.791 m)   Wt 162 lb 6.4 oz (73.7 kg)   SpO2 98%   BMI 22.97 kg/m  Wt Readings from Last 3 Encounters:  01/04/23 162 lb 6.4 oz (73.7 kg)  12/05/22 159 lb (72.1 kg)  10/05/22 160 lb (72.6 kg)    Lab Results  Component Value Date   TSH 4.000 12/21/2022   Lab Results  Component Value Date   WBC 6.9 07/21/2022   HGB 14.3 07/21/2022   HCT 42.9 07/21/2022   MCV 97 07/21/2022   PLT 207 07/21/2022   Lab Results  Component Value Date   NA 137 07/21/2022   K 5.0 07/21/2022   CO2 23 07/21/2022   GLUCOSE 101 (H) 07/21/2022   BUN 16 07/21/2022   CREATININE 0.83 07/21/2022   BILITOT 0.5 07/21/2022   ALKPHOS 77 07/21/2022   AST 32 07/21/2022   ALT 32 07/21/2022   PROT 6.9 07/21/2022   ALBUMIN 4.4 07/21/2022   CALCIUM 9.4 07/21/2022   EGFR 89 07/21/2022   Lab Results  Component Value Date   CHOL 239 (H) 12/21/2022   Lab Results  Component Value Date   HDL 46 12/21/2022   Lab Results  Component Value Date   LDLCALC 142 (H) 12/21/2022   Lab Results  Component Value Date   TRIG 282 (H) 12/21/2022   Lab Results  Component  Value Date   CHOLHDL 5.2 (H) 12/21/2022   Lab Results  Component Value Date   HGBA1C 5.6 07/21/2022      Assessment & Plan:   Problem List Items Addressed This Visit       Endocrine   Hypothyroidism    Lab Results  Component Value Date   TSH 4.000 12/21/2022   With normal free T4 On Levothyroxine 25 mcg QD as he had symptoms - chronic fatigue and memory changes Check TSH and free T4 after 4 months      Relevant Medications   levothyroxine (SYNTHROID) 25 MCG tablet   Other Relevant Orders   TSH + free T4   CMP14+EGFR     Nervous and Auditory   Dementia due to Alzheimer's disease (HCC)     On Namenda Did not tolerate Aricept Followed by Neurology      Relevant Medications   traZODone (DESYREL) 50 MG tablet     Other   GAD (generalized anxiety disorder) - Primary (Chronic)    Had anxiety and insomnia On Trazodone 50 mg qHS PRN for insomnia, now improved Sleep hygiene discussed      Relevant Medications   traZODone (DESYREL) 50 MG tablet   Chronic fatigue    Was placed on NP thyroid and testosterone in the past by previous PCP, but he did not like it and stopped taking them Checked TSH and free T4, CBC Recent CMP reviewed - unremarkable      Relevant Orders   CBC with Differential/Platelet   Benign prostatic hyperplasia with nocturia    On Flomax UA reviewed Followed by Urology        Meds ordered this encounter  Medications   levothyroxine (SYNTHROID) 25 MCG tablet    Sig: Take 1 tablet (25 mcg total) by mouth daily.    Dispense:  90 tablet    Refill:  1   traZODone (DESYREL) 50 MG tablet    Sig: Take 1 tablet (50 mg total) by mouth at bedtime.    Dispense:  30 tablet    Refill:  3    Follow-up: Return in about 4 months (around 05/07/2023) for Hypothyroidism and insomnia.    Anabel Halon, MD

## 2023-01-04 NOTE — Assessment & Plan Note (Signed)
 On Namenda Did not tolerate Aricept Followed by Neurology

## 2023-01-04 NOTE — Assessment & Plan Note (Addendum)
Lab Results  Component Value Date   TSH 4.000 12/21/2022   With normal free T4 On Levothyroxine 25 mcg QD as he had symptoms - chronic fatigue and memory changes Check TSH and free T4 after 4 months

## 2023-01-04 NOTE — Assessment & Plan Note (Signed)
Had anxiety and insomnia On Trazodone 50 mg qHS PRN for insomnia, now improved Sleep hygiene discussed

## 2023-01-04 NOTE — Patient Instructions (Addendum)
Please continue to take medications as prescribed.  Please continue to follow low cholesterol diet and perform moderate exercise/walking at least 150 mins/week.  Please get fasting blood tests done before the next visit.

## 2023-01-04 NOTE — Assessment & Plan Note (Signed)
On Flomax UA reviewed Followed by Urology

## 2023-01-04 NOTE — Assessment & Plan Note (Signed)
Was placed on NP thyroid and testosterone in the past by previous PCP, but he did not like it and stopped taking them Checked TSH and free T4, CBC Recent CMP reviewed - unremarkable

## 2023-02-14 ENCOUNTER — Ambulatory Visit: Payer: Medicare HMO | Admitting: Psychology

## 2023-02-14 ENCOUNTER — Ambulatory Visit: Payer: Medicare HMO

## 2023-02-14 ENCOUNTER — Encounter: Payer: Self-pay | Admitting: Psychology

## 2023-02-14 DIAGNOSIS — F332 Major depressive disorder, recurrent severe without psychotic features: Secondary | ICD-10-CM

## 2023-02-14 DIAGNOSIS — R4189 Other symptoms and signs involving cognitive functions and awareness: Secondary | ICD-10-CM

## 2023-02-14 DIAGNOSIS — G3184 Mild cognitive impairment, so stated: Secondary | ICD-10-CM | POA: Diagnosis not present

## 2023-02-14 DIAGNOSIS — F411 Generalized anxiety disorder: Secondary | ICD-10-CM

## 2023-02-14 HISTORY — DX: Mild cognitive impairment of uncertain or unknown etiology: G31.84

## 2023-02-14 NOTE — Progress Notes (Signed)
   Psychometrician Note   Cognitive testing was administered to [employer] by Shan Levans, B.S. (psychometrist) under the supervision of Dr. Newman Nickels, Ph.D., licensed psychologist on 02/14/2023. Mr. Amie did not appear overtly distressed by the testing session per behavioral observation or responses across self-report questionnaires. Rest breaks were offered.    The battery of tests administered was selected by Dr. Newman Nickels, Ph.D. with consideration to Mr. Torian current level of functioning, the nature of his symptoms, emotional and behavioral responses during interview, level of literacy, observed level of motivation/effort, and the nature of the referral question. This battery was communicated to the psychometrist. Communication between Dr. Newman Nickels, Ph.D. and the psychometrist was ongoing throughout the evaluation and Dr. Newman Nickels, Ph.D. was immediately accessible at all times. Dr. Newman Nickels, Ph.D. provided supervision to the psychometrist on the date of this service to the extent necessary to assure the quality of all services provided.    Mirian Mo will return within approximately 1-2 weeks for an interactive feedback session with Dr. Milbert Coulter at which time his test performances, clinical impressions, and treatment recommendations will be reviewed in detail. Mr. Clougherty understands he can contact our office should he require our assistance before this time.  A total of 130 minutes of billable time were spent face-to-face with Mr. Sellars by the psychometrist. This includes both test administration and scoring time. Billing for these services is reflected in the clinical report generated by Dr. Newman Nickels, Ph.D.  This note reflects time spent with the psychometrician and does not include test scores or any clinical interpretations made by Dr. Milbert Coulter. The full report will follow in a separate note.

## 2023-02-14 NOTE — Progress Notes (Signed)
NEUROPSYCHOLOGICAL EVALUATION [facility] Department of Neurology  Date of Evaluation: February 14, 2023  Reason for Referral:   Scott Chang is a 82 y.o. right-handed Caucasian male referred by Marlowe Kays, PA-C, to characterize his current cognitive functioning and assist with diagnostic clarity and treatment planning in the context of subjective cognitive decline.  Assessment and Plan:   Clinical Impression(s): Scott Chang pattern of performance is suggestive of primary impairments surrounding executive functioning, verbal fluency, and all aspects of learning and memory. Additional performance variability was exhibited across processing speed and visuospatial abilities. Performances were appropriate relative to age-matched peers across attention/concentration, safety/judgment, receptive language, and confrontation naming. Functionally, Scott Chang lives alone and denied difficulties completing instrumental activities of daily living (ADLs) independently. His daughter lives nearby and, outside of her providing him reminders to take medications from time to time, was largely in agreement. As such, given evidence for cognitive dysfunction described above, he meets criteria for a Mild Neurocognitive Disorder ("mild cognitive impairment") at the present time.  The etiology for ongoing cognitive impairment is unclear. With that being said, I do have concerns surrounding an underlying neurodegenerative illness, namely Alzheimer's disease. Scott Chang did not benefit from the repetition of information across learning trials, was fully amnestic (i.e., 0% retention) across 2/3 memory tasks after a brief delay, and performed poorly across yes/no recognition trials. Taken together, this suggests evidence for rapid forgetting and a profound storage impairment, both of which are the hallmark testing patterns for this illness. Further impairment surrounding semantic fluency and  executive functioning is common and expected with typical disease trajectory. Intact confrontation naming is encouraging and could suggest that this illness still remains in earlier stages relatively speaking.   Scott Chang does not display behavioral characteristics of Lewy body disease, another more rare parkinsonian presentation, or frontotemporal lobar degeneration. Prior brain imaging did not reveal advanced cerebrovascular disease, making a primary vascular etiology unlikely. While he did report acute levels of severe anxiety and depression within the past 1-2 weeks, I do not feel that this would better account for largely amnestic memory. Psychiatric distress may certainly be worsening his overall presentation however. Continued medical monitoring will be important moving forward.   Recommendations: A repeat neuropsychological evaluation in 12-18 months (or sooner if functional decline is noted) is recommended to assess the trajectory of future cognitive decline should it occur. This will also aid in future efforts towards improved diagnostic clarity.  Scott Chang has already been prescribed medication aimed to address memory loss and concerns surrounding Alzheimer's disease (i.e., donepezil/Aricept and memantine/Namenda). He is encouraged to continue taking these medications as prescribed. It is important to highlight that this medication has been shown to slow functional decline in some individuals. There is no current treatment which can stop or reverse cognitive decline when caused by a neurodegenerative illness.   During testing, visual acuity concerns were noted. He had prominent difficulty lining up responses across mood-related questionnaires in addition to prominent dysfunction across tasks assessing visuospatial abilities. While he denied his perception of visual acuity concerns during interview, he did report eye aches and fatigue. Based upon this, I would recommend that he have an eye  evaluation performed to determine any underlying and treatable conditions.   Given the severity of acute psychiatric distress, Scott Chang is encouraged to speak with his prescribing physician regarding medication adjustments to optimally manage these symptoms. Given his degree of memory impairment, I do not feel that individual psychotherapy to address  these concerns would be beneficial.   Performance across neurocognitive testing is not a strong predictor of an individual's safety operating a motor vehicle. Despite this, I do have some concern surrounding driving ability given the degree of impairment surrounding executive functioning, visuospatial abilities, and memory. It would likely be prudent for Scott Chang and his family to pursue a formalized driving evaluation. They could reach out to the following agencies: [employer] in [place]: [phone] [facility]: [phone] [facility]: [phone] [facility]: [phone] or [phone]  Should there be progression of current deficits over time, Scott Chang is unlikely to regain any independent living skills lost. Therefore, it is recommended that he remain as involved as possible in all aspects of household chores, finances, and medication management, with supervision to ensure adequate performance. He will likely benefit from the establishment and maintenance of a routine in order to maximize his functional abilities over time.  It will be important for Scott Chang to have another person with him when in situations where he may need to process information, weigh the pros and cons of different options, and make decisions, in order to ensure that he fully understands and recalls all information to be considered.  If not already done, Scott Chang and his family may want to discuss his wishes regarding durable power of attorney and medical decision making, so that he can have input into these  choices. If they require legal assistance with this, long-term care resource access, or other aspects of estate planning, they could reach out to [facility] [employer] at [phone] for a free consultation. Additionally, they may wish to discuss future plans for caretaking and seek out community options for in home/residential care should they become necessary.  Scott Chang is encouraged to attend to lifestyle factors for brain health (e.g., regular physical exercise, good nutrition habits and consideration of the MIND-DASH diet, regular participation in cognitively-stimulating activities, and general stress management techniques), which are likely to have benefits for both emotional adjustment and cognition. In fact, in addition to promoting good general health, regular exercise incorporating aerobic activities (e.g., brisk walking, jogging, cycling, etc.) has been demonstrated to be a very effective treatment for depression and stress, with similar efficacy rates to both antidepressant medication and psychotherapy. Optimal control of vascular risk factors (including safe cardiovascular exercise and adherence to dietary recommendations) is encouraged. Continued participation in activities which provide mental stimulation and social interaction is also recommended.   Important information should be provided to Scott Chang in written format in all instances. This information should be placed in a highly frequented and easily visible location within his home to promote recall. External strategies such as written notes in a consistently used memory journal, visual and nonverbal auditory cues such as a calendar on the refrigerator or appointments with alarm, such as on a cell phone, can also help maximize recall.  Review of Records:   Scott Chang was seen by [facility] Marlowe Kays, [place]) on 05/02/2022 for an evaluation of memory loss. Memory decline was said to be present for the past 3-4 years, likely  exacerbated by the passing of his wife in November 2022. Primary examples included trouble recalling names and details of recent conversations. Trouble with ADLs was denied. Performance on a brief cognitive screening instrument (MOCA) was 17/30. Ultimately, Scott Chang was referred for a comprehensive neuropsychological evaluation to characterize his cognitive abilities and to assist with diagnostic clarity and treatment planning.   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 Brain MRI on 05/30/2022 was stable.   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   DDD (degenerative disc disease), lumbar 07/27/2018   Dyspnea on exertion 04/18/2019   GAD (generalized anxiety disorder)    History of colonic polyps 07/27/2018   HLD (hyperlipidemia) 04/18/2019   Hypercholesteremia    Hypothyroidism 01/17/2022   Neuropathy    Osteoarthritis 01/17/2022   Urinary urgency 09/21/2021   Vitamin D deficiency 07/27/2018    Past Surgical History:  Procedure Laterality Date   APPENDECTOMY     CHOLECYSTECTOMY     COLONOSCOPY N/A 05/09/2013   Procedure: COLONOSCOPY;  Surgeon: Malissa Hippo, MD;  Location: [facility];  Service: Endoscopy;  Laterality: N/A;  1030   COLONOSCOPY W/ BIOPSIES AND POLYPECTOMY     Left knee arthroscopy     Left shoulder arthroscopy     X 2    Current Outpatient Medications:    augmented betamethasone dipropionate (DIPROLENE-AF) 0.05 % cream, Apply topically., Disp: , Rfl:    bacitracin-polymyxin  b (POLYSPORIN) ophthalmic ointment, , Disp: , Rfl:    clobetasol cream (TEMOVATE) 0.05 %, Apply 1 Application topically 2 (two) times daily., Disp: 30 g, Rfl: 0   donepezil (ARICEPT) 5 MG tablet, Take 1 tablet (5 mg total) by mouth at bedtime., Disp: 30 tablet, Rfl: 11   EPINEPHrine 0.3 mg/0.3 mL IJ SOAJ injection, Inject 0.3 mg into the muscle as needed for anaphylaxis. (Patient not taking: Reported on 12/05/2022), Disp: 1 each, Rfl: 1   erythromycin ophthalmic ointment, at bedtime. (Patient not taking: Reported on 12/05/2022), Disp: , Rfl:    levothyroxine (SYNTHROID) 25 MCG tablet, Take 1 tablet (25 mcg total) by mouth daily., Disp: 90 tablet, Rfl: 1   meclizine (ANTIVERT) 25 MG tablet, Take 1 tablet (25 mg total) by mouth 2 (two) times daily as needed for dizziness., Disp: 30 tablet, Rfl: 1   memantine (NAMENDA) 5 MG tablet, Take 1 tablet (5 mg total) by mouth 2 (two) times daily. (Patient not taking: Reported on 12/05/2022), Disp: 60 tablet, Rfl: 11   mirabegron ER (MYRBETRIQ) 50 MG TB24 tablet, Take 1 tablet (50 mg total) by mouth daily., Disp: 30 tablet, Rfl: 11   Multiple Vitamin (MULTIVITAMIN WITH MINERALS) TABS tablet, Take 1 tablet by mouth daily., Disp: , Rfl:    Propylene Glycol (SYSTANE COMPLETE OP), Apply to eye., Disp: , Rfl:    tamsulosin (FLOMAX) 0.4 MG CAPS capsule, Take 1 capsule (0.4 mg total) by mouth daily after supper., Disp: 30 capsule, Rfl: 11   traZODone (DESYREL) 50 MG tablet, Take 1 tablet (50 mg total) by mouth at bedtime., Disp: 30 tablet, Rfl: 3  Clinical Interview:   The following information was obtained during a clinical interview with Scott Chang and his daughter prior to cognitive testing.  Cognitive Symptoms: Decreased short-term memory: Endorsed. Primary examples surrounded trouble recalling names and details of recent conversations. He also added entering rooms and forgetting his intention or trouble remembering what tool he was going to get while working on his  car. Difficulties were said to have progressed gradually over the past several years. His daughter was in agreement. She also confirmed that memory difficulties were present prior to his wife's passing in November 2022.  Decreased long-term memory: Denied. Decreased attention/concentration: Endorsed. He reported a diminished ability to remain focused for longer periods of time.  Reduced processing speed: Endorsed. Difficulties with executive functions: Denied. He and his daughter further denied trouble with impulsivity or any significant personality changes.  Difficulties with emotion regulation: Denied. Difficulties with receptive language: Denied. Difficulties with word finding: Endorsed. Decreased visuoperceptual ability: Denied.  Difficulties completing ADLs: Largely denied. He continues to live alone and manages medications and finances independently. His daughter lives on the adjacent property and does provide assistance with medications in the form of reminders and double checking that medications have been taken appropriately. He continues to drive without reported issue.   Additional Medical History: History of traumatic brain injury/concussion: Denied. History of stroke: Denied. History of seizure activity: Denied. History of known exposure to toxins: Denied. Symptoms of chronic pain: Endorsed. He reported symptoms of arthritis in is hands. His daughter also highlighted chronic pain in his knees and feet.  Experience of frequent headaches/migraines: Denied. Frequent instances of dizziness/vertigo: Endorsed. Symptoms generally occur when standing quickly or quickly changing his positioning.   Sensory changes: Denied. He did report some increased eye ache and fatigue. However, visual acuity was not said to be compromised and the cause for this was unknown.  Balance/coordination difficulties: He described his balance as "off." He noted several times where he was able to catch himself  while stumbling but did not report any recently completed falls. One side of the body was not said to be less stable Chang the other. His daughter theorized that mild instability was likely related to knee and foot pain.  Other motor difficulties: Denied.  Sleep History: Estimated hours obtained each night: 8 hours.  Difficulties falling asleep: Historically, he described poor sleep and trouble falling asleep due to an overactive mind. However, for the past 2-3 months, he reported no sleep concerns. The cause for this improvement was unknown.  Difficulties staying asleep: Denied. Feels rested and refreshed upon awakening: Endorsed.  History of snoring: Denied. History of waking up gasping for air: Denied. Witnessed breath cessation while asleep: Denied.  History of vivid dreaming: Denied. Excessive movement while asleep: Denied. Instances of acting out his dreams: Denied.  Psychiatric/Behavioral Health History: Depression: He described his current mood as "down." This was attributed to ongoing grief following the passing of his wife, as well as concerns surrounding cognitive decline and trouble remembering day-to-day tasks. He denied to his knowledge ever being formally diagnosed with a depressive mental health condition. Current or remote suicidal ideation, intent, or plan was denied.  Anxiety: He reported a longstanding history of generalized anxious distress. These difficulties have been elevated lately, largely due to his perception of progressive cognitive decline.  Mania: Denied. Trauma History: Denied. Visual/auditory hallucinations: Denied. Delusional thoughts: Denied.  Tobacco: Denied. Alcohol: He denied current alcohol consumption as well as a history of problematic alcohol abuse or dependence.  Recreational drugs: Denied.  Family History: Problem Relation Age of Onset   Chronic Renal Failure Mother    Heart attack Father    Cancer Brother    Osteoarthritis Daughter     Colon cancer Neg Hx    This information was confirmed by Scott Chang.  Academic/Vocational History: Highest level of educational attainment: 12 years. He graduated from high school and  described himself as a C/D student throughout academic settings. Math was noted as a likely relative weakness.  History of developmental delay: Denied. History of grade repetition: Endorsed. He reported needing to repeat the 3rd grade. However, this was due to his family moving around and him frequently changing schools during that time rather Chang due to poor academic performance.  Enrollment in special education courses: Denied. History of LD/ADHD: Denied.  Employment: Retired. 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   Evaluation Results:   Behavioral Observations: Scott Chang was accompanied by his daughter, arrived to his appointment on time, and was appropriately dressed and groomed. He appeared alert and oriented. Observed gait and station were within normal limits. Gross motor functioning appeared intact upon informal observation and no abnormal movements (e.g., tremors) were noted. His affect was generally relaxed and positive. Spontaneous speech was fluent and word finding difficulties were not observed during the clinical interview. Thought processes were coherent, organized, and normal in content. Insight into his cognitive difficulties appeared adequate.   During testing, greater visual acuity concerns were noted. He had prominent difficulty lining up responses across mood-related questionnaires in addition to prominent dysfunction across tasks assessing visuospatial abilities. Sustained attention was appropriate. Task engagement was adequate and he persisted when challenged. Overall, Scott Chang was cooperative with the clinical interview and subsequent testing procedures.   Adequacy of Effort: The validity of neuropsychological  testing is limited by the extent to which the individual being tested may be assumed to have exerted adequate effort during testing. Mr. Domanski expressed his intention to perform to the best of his abilities and exhibited adequate task engagement and persistence. Scores across stand-alone and embedded performance validity measures were within expectation. As such, the results of the current evaluation are believed to be a valid representation of Mr. Carraher current cognitive functioning.  Test Results: Mr. Rubendall was largely oriented at the time of the current evaluation. He was three days off when stating the current date.  Intellectual abilities based upon educational and vocational attainment were estimated to be in the below average to average range. Premorbid abilities were estimated to be within the lower limits of the average range based upon a single-word reading test.   Processing speed was exceptionally low across a rapid symbol decoding task but below average across three other tasks. Basic attention was average to above average. More complex attention (e.g., working memory) was below average. Executive functioning was exceptionally low to well below average. He performed in the average range across a task assessing safety and judgment.  Assessed receptive language abilities were below average. Mr. Taboada did not exhibit any difficulties comprehending task instructions and answered all questions asked of him appropriately. Assessed expressive language was mildly variable. Phonemic fluency was well below average, semantic fluency was exceptionally low to well below average, and confrontation naming was average to above average.    Assessed visuospatial/visuoconstructional abilities were exceptionally low outside of an appropriate performance when copying a complex figure. When asked to draw a clock, he drew the outer circle and placed the numbers well. He exhibited confusion and was unable to  make an attempt at placing the clock hands.    Learning (i.e., encoding) of novel verbal information was exceptionally low. Spontaneous delayed recall (i.e., retrieval) of previously learned information was exceptionally low to well below average. Retention rates were 100% (raw score of 3) across a story learning task, 0% across a  list learning task, and 0% across a figure drawing task. Performance across recognition tasks was exceptionally low to well below average, suggesting negligible evidence for information consolidation.   Results of emotional screening instruments suggested that recent symptoms of generalized anxiety were in the severe range, while symptoms of depression were also within the severe range. A screening instrument assessing recent sleep quality suggested the presence of minimal sleep dysfunction.  Tables of Scores:   Note: This summary of test scores accompanies the interpretive report and should not be considered in isolation without reference to the appropriate sections in the text. Descriptors are based on appropriate normative data and may be adjusted based on clinical judgment. Terms such as "Within Normal Limits" and "Outside Normal Limits" are used when a more specific description of the test score cannot be determined.       Percentile - Normative Descriptor > 98 - Exceptionally High 91-97 - Well Above Average 75-90 - Above Average 25-74 - Average 9-24 - Below Average 2-8 - Well Below Average < 2 - Exceptionally Low       Validity:   DESCRIPTOR       DCT: --- --- Within Normal Limits  RBANS EI: --- --- Within Normal Limits  WAIS-IV RDS: --- --- Within Normal Limits       Orientation:      Raw Score Percentile   NAB Orientation, Form 1 27/29 --- ---       Cognitive Screening:      Raw Score Percentile   SLUMS: 14/30 --- ---       RBANS, Form A: Standard Score/ Scaled Score Percentile   Total Score 60 <1 Exceptionally Low  Immediate Memory 49 <1  Exceptionally Low    List Learning 1 <1 Exceptionally Low    Story Memory 3 1 Exceptionally Low  Visuospatial/Constructional 81 10 Below Average    Figure Copy 10 50 Average    Line Orientation 8/20 <2 Exceptionally Low  Language 83 13 Below Average    Picture Naming 10/10 >75 Above Average    Semantic Fluency 3 1 Exceptionally Low  Attention 72 3 Well Below Average    Digit Span 9 37 Average    Coding 2 <1 Exceptionally Low  Delayed Memory 56 <1 Exceptionally Low    List Recall 0/10 <2 Exceptionally Low    List Recognition 16/20 3-9 Well Below Average    Story Recall 5 5 Well Below Average    Story Recognition 5/12 1-4 Exceptionally Low    Figure Recall 1 <1 Exceptionally Low    Figure Recognition 0/8 <1 Exceptionally Low        Intellectual Functioning:      Standard Score Percentile   Test of Premorbid Functioning: 90 25 Average       Attention/Executive Function:     Trail Making Test (TMT): Raw Score (Scaled Score) Percentile     Part A 76 secs.,  0 errors (6) 9 Below Average    Part B Discontinued --- Impaired  *Based on [employer] Older Normative Studies (MOANS)           Scaled Score Percentile   WAIS-IV Digit Span: 8 25 Average    Forward 12 75 Above Average    Backward 7 16 Below Average    Sequencing 6 9 Below Average        Scaled Score Percentile   WAIS-IV Similarities: 5 5 Well Below Average       D-KEFS Color-Word Interference Test: Raw  Score (Scaled Score) Percentile     Color Naming 47 secs. (6) 9 Below Average    Word Reading 34 secs. (6) 9 Below Average    Inhibition Discontinued --- Impaired    Inhibition/Switching Discontinued --- Impaired       NAB Executive Functions Module, Form 1: T Score Percentile     Judgment 56 73 Average       Language:     Verbal Fluency Test: Raw Score (Scaled Score) Percentile     Phonemic Fluency (CFL) 15 (5) 5 Well Below Average    Category Fluency 22 (5) 5 Well Below Average  *Based on [employer] Older Normative  Studies (MOANS)          NAB Language Module, Form 1: T Score Percentile     Auditory Comprehension 41 18 Below Average    Naming 29/31 (52) 58 Average       Visuospatial/Visuoconstruction:      Raw Score Percentile   Clock Drawing: 6/10 --- Impaired        Scaled Score Percentile   WAIS-IV Block Design: 3 1 Exceptionally Low       Mood and Personality:      Raw Score Percentile   Geriatric Depression Scale: 24 --- Severe  Geriatric Anxiety Scale: 28 --- Severe    Somatic 8 --- Mild    Cognitive 12 --- Severe    Affective 8 --- Moderate       Additional Questionnaires:      Raw Score Percentile   PROMIS Sleep Disturbance Questionnaire: 8 --- None to Slight   Informed Consent and Coding/Compliance:   The current evaluation represents a clinical evaluation for the purposes previously outlined by the referral source and is in no way reflective of a forensic evaluation.   Mr. Sandvik was provided with a verbal description of the nature and purpose of the present neuropsychological evaluation. Also reviewed were the foreseeable risks and/or discomforts and benefits of the procedure, limits of confidentiality, and mandatory reporting requirements of this provider. The patient was given the opportunity to ask questions and receive answers about the evaluation. Oral consent to participate was provided by the patient.   This evaluation was conducted by Newman Nickels, Ph.D., ABPP-CN, board certified clinical neuropsychologist. Mr. Bolek completed a clinical interview with Dr. Milbert Coulter, billed as one unit [phone], and 130 minutes of cognitive testing and scoring, billed as one unit [phone] and three additional units [id number]. Psychometrist Shan Levans, B.S. assisted Dr. Milbert Coulter with test administration and scoring procedures. As a separate and discrete service, one unit [id number] and two units [phone] were billed for Dr. Tammy Sours time spent in interpretation and report writing.

## 2023-02-22 ENCOUNTER — Encounter: Payer: Medicare HMO | Admitting: Psychology

## 2023-02-22 ENCOUNTER — Ambulatory Visit: Payer: Medicare HMO | Admitting: Psychology

## 2023-02-22 DIAGNOSIS — F411 Generalized anxiety disorder: Secondary | ICD-10-CM | POA: Diagnosis not present

## 2023-02-22 DIAGNOSIS — G3184 Mild cognitive impairment, so stated: Secondary | ICD-10-CM

## 2023-02-22 DIAGNOSIS — F331 Major depressive disorder, recurrent, moderate: Secondary | ICD-10-CM

## 2023-02-22 NOTE — Progress Notes (Signed)
   Neuropsychology Feedback Session [name]. [facility] [place] Department of Neurology  Reason for Referral:   Scott Chang is a 82 y.o. right-handed Caucasian male referred by Marlowe Kays, PA-C, to characterize his current cognitive functioning and assist with diagnostic clarity and treatment planning in the context of subjective cognitive decline.   Feedback:   [name] completed a comprehensive neuropsychological evaluation on 02/14/2023. Please refer to that encounter for the full report and recommendations. Briefly, results suggested primary impairments surrounding executive functioning, verbal fluency, and all aspects of learning and memory. Additional performance variability was exhibited across processing speed and visuospatial abilities. The etiology for ongoing cognitive impairment is unclear. With that being said, I do have concerns surrounding an underlying neurodegenerative illness, namely Alzheimer's disease. [name] did not benefit from the repetition of information across learning trials, was fully amnestic (i.e., 0% retention) across 2/3 memory tasks after a brief delay, and performed poorly across yes/no recognition trials. Taken together, this suggests evidence for rapid forgetting and a profound storage impairment, both of which are the hallmark testing patterns for this illness. Further impairment surrounding semantic fluency and executive functioning is common and expected with typical disease trajectory. Intact confrontation naming is encouraging and could suggest that this illness still remains in earlier stages relatively speaking.   [name] was accompanied by his daughter during the current feedback session. Content of the current session focused on the results of his neuropsychological evaluation. [name] was given the opportunity to ask questions and his questions were answered. He was encouraged to reach out should additional questions arise. A  copy of his report was provided at the conclusion of the visit.      One unit [phone] was billed for Dr. Tammy Sours time spent preparing for, conducting, and documenting the current feedback session with Mr. Dichter.

## 2023-03-02 DIAGNOSIS — E039 Hypothyroidism, unspecified: Secondary | ICD-10-CM | POA: Diagnosis not present

## 2023-03-02 DIAGNOSIS — R5382 Chronic fatigue, unspecified: Secondary | ICD-10-CM | POA: Diagnosis not present

## 2023-03-06 ENCOUNTER — Encounter: Payer: Self-pay | Admitting: Internal Medicine

## 2023-03-06 DIAGNOSIS — J101 Influenza due to other identified influenza virus with other respiratory manifestations: Secondary | ICD-10-CM | POA: Diagnosis not present

## 2023-03-06 DIAGNOSIS — R030 Elevated blood-pressure reading, without diagnosis of hypertension: Secondary | ICD-10-CM | POA: Diagnosis not present

## 2023-03-06 DIAGNOSIS — J3089 Other allergic rhinitis: Secondary | ICD-10-CM | POA: Diagnosis not present

## 2023-03-06 DIAGNOSIS — U071 COVID-19: Secondary | ICD-10-CM | POA: Diagnosis not present

## 2023-03-06 DIAGNOSIS — R0981 Nasal congestion: Secondary | ICD-10-CM | POA: Diagnosis not present

## 2023-03-06 DIAGNOSIS — Z6823 Body mass index (BMI) 23.0-23.9, adult: Secondary | ICD-10-CM | POA: Diagnosis not present

## 2023-03-07 ENCOUNTER — Encounter: Payer: Self-pay | Admitting: Internal Medicine

## 2023-03-07 ENCOUNTER — Telehealth: Payer: Medicare PPO | Admitting: Internal Medicine

## 2023-03-07 DIAGNOSIS — J0110 Acute frontal sinusitis, unspecified: Secondary | ICD-10-CM

## 2023-03-07 MED ORDER — AZITHROMYCIN 250 MG PO TABS
ORAL_TABLET | ORAL | 0 refills | Status: AC
Start: 2023-03-07 — End: 2023-03-12

## 2023-03-07 MED ORDER — METHYLPREDNISOLONE 4 MG PO TBPK
ORAL_TABLET | ORAL | 0 refills | Status: DC
Start: 2023-03-07 — End: 2023-05-08

## 2023-03-07 NOTE — Assessment & Plan Note (Signed)
Started empiric azithromycin considering his persistent symptoms despite symptomatic treatment Medrol Dosepak considering history of allergies and recent dyspnea Continue Claritin and use Nasacort or Astelin nasal spray Continue nasal lavage as tolerated Avoid Sudafed due to rebound nasal congestion Advised to check home COVID test and contact if positive - he had COVID vaccine 2 weeks ago

## 2023-03-07 NOTE — Progress Notes (Signed)
Virtual Visit via Video Note   Because of Scott Chang's co-morbid illnesses, he is at least at moderate risk for complications without adequate follow up.  This format is felt to be most appropriate for this patient at this time.  All issues noted in this document were discussed and addressed.  A limited physical exam was performed with this format.      Evaluation Performed:  Follow-up visit  Date:  03/07/2023   ID:  Scott Chang, DOB 1940/09/16, MRN [id number]  Patient Location: Home Provider Location: Office/Clinic  Participants: Patient Location of Patient: Home Location of Provider: Telehealth Consent was obtain for visit to be over via telehealth. I verified that I am speaking with the correct person using two identifiers.  PCP:  Anabel Halon, MD   Chief Complaint: Nasal congestion  History of Present Illness:    Scott Chang is a 82 y.o. male who has a video visit for complaint of nasal congestion and postnasal drip for the last 5 days.  He presented to urgent care on 03/06/23 and was given Astelin nasal spray.  He reports that he had been taking Claritin and Nasacort nasal spray already without much relief.  He initially had dyspnea upon minimal exertion, which has improved now.  He has also tried taking Sudafed with no relief.  He has tried nasal lavage (Navage device) with transient relief.  Denies any fever or chills currently.  The patient does not have symptoms concerning for COVID-19 infection (fever, chills, cough, or new shortness of breath).   Past Medical, Surgical, Social History, Allergies, and Medications have been Reviewed.  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   Arthritis    hands   Bee sting allergy 01/21/2022   Benign paroxysmal positional vertigo 09/21/2021   Benign prostatic hyperplasia with nocturia 09/21/2021   Callus of foot  09/21/2021   Chronic ear pain, bilateral 04/25/2022   Chronic fatigue 07/27/2018   DDD (degenerative disc disease), cervical 07/27/2018   DDD (degenerative disc disease), lumbar 07/27/2018   Dyspnea on exertion 04/18/2019   GAD (generalized anxiety disorder)    History of colonic polyps 07/27/2018   HLD (hyperlipidemia) 04/18/2019   Hypercholesteremia    Hypothyroidism 01/17/2022   Neuropathy    Osteoarthritis 01/17/2022   Urinary urgency 09/21/2021   Vitamin D deficiency 07/27/2018   Past Surgical History:  Procedure Laterality Date   APPENDECTOMY     CHOLECYSTECTOMY     COLONOSCOPY N/A 05/09/2013   Procedure: COLONOSCOPY;  Surgeon: Malissa Hippo, MD;  [facility];  Service: Endoscopy;  Laterality: N/A;  1030   COLONOSCOPY W/ BIOPSIES AND POLYPECTOMY     Left knee arthroscopy     Left shoulder arthroscopy     X 2     Current Meds  Medication Sig   azithromycin (ZITHROMAX) 250 MG tablet Take 2 tablets on day 1, then 1 tablet daily on days 2 through 5   methylPREDNISolone (MEDROL DOSEPAK) 4 MG TBPK tablet Take as package instructions.     Allergies:   Bee venom and Penicillins   ROS:   Please see the history of present illness.     All other systems reviewed and are negative.   Labs/Other Tests and Data Reviewed:    Recent Labs: 03/02/2023: ALT 26; BUN 17; Creatinine, Ser 0.81; Hemoglobin 13.5; Platelets 187; Potassium 4.3; Sodium 136;  TSH 3.280   Recent Lipid Panel Lab Results  Component Value Date/Time   CHOL 239 (H) 12/21/2022 10:39 AM   TRIG 282 (H) 12/21/2022 10:39 AM   HDL 46 12/21/2022 10:39 AM   CHOLHDL 5.2 (H) 12/21/2022 10:39 AM   LDLCALC 142 (H) 12/21/2022 10:39 AM    Wt Readings from Last 3 Encounters:  01/04/23 162 lb 6.4 oz (73.7 kg)  12/05/22 159 lb (72.1 kg)  10/05/22 160 lb (72.6 kg)     Objective:    Vital Signs:  There were no vitals taken for this visit.   VITAL SIGNS:  reviewed GEN:  no acute distress EYES:  sclerae  anicteric, EOMI - Extraocular Movements Intact RESPIRATORY:  normal respiratory effort, symmetric expansion NEURO:  alert and oriented x 3, no obvious focal deficit PSYCH:  normal affect  ASSESSMENT & PLAN:    Acute non-recurrent frontal sinusitis Started empiric azithromycin considering his persistent symptoms despite symptomatic treatment Medrol Dosepak considering history of allergies and recent dyspnea Continue Claritin and use Nasacort or Astelin nasal spray Continue nasal lavage as tolerated Avoid Sudafed due to rebound nasal congestion Advised to check home COVID test and contact if positive - he had COVID vaccine 2 weeks ago    I discussed the assessment and treatment plan with the patient. The patient was provided an opportunity to ask questions, and all were answered. The patient agreed with the plan and demonstrated an understanding of the instructions.   The patient was advised to call back or seek an in-person evaluation if the symptoms worsen or if the condition fails to improve as anticipated.  The above assessment and management plan was discussed with the patient. The patient verbalized understanding of and has agreed to the management plan.   Medication Adjustments/Labs and Tests Ordered: Current medicines are reviewed at length with the patient today.  Concerns regarding medicines are outlined above.   Tests Ordered: No orders of the defined types were placed in this encounter.   Medication Changes: Meds ordered this encounter  Medications   azithromycin (ZITHROMAX) 250 MG tablet    Sig: Take 2 tablets on day 1, then 1 tablet daily on days 2 through 5    Dispense:  6 tablet    Refill:  0   methylPREDNISolone (MEDROL DOSEPAK) 4 MG TBPK tablet    Sig: Take as package instructions.    Dispense:  1 each    Refill:  0     Note: This dictation was prepared with Dragon dictation along with smaller phrase technology. Similar sounding words can be transcribed  inadequately or may not be corrected upon review. Any transcriptional errors that result from this process are unintentional.      Disposition:  Follow up  Signed, Anabel Halon, MD  03/07/2023 12:17 PM     [facility]

## 2023-03-07 NOTE — Patient Instructions (Signed)
Please start taking Azithromycin and Prednisone as prescribed.  Do not take Sudafed for now. Continue Claritin and Nasacort or Astelin nasal spray for nasal congestion.

## 2023-03-23 DIAGNOSIS — Z79899 Other long term (current) drug therapy: Secondary | ICD-10-CM | POA: Diagnosis not present

## 2023-04-05 ENCOUNTER — Ambulatory Visit: Payer: Medicare PPO | Admitting: Urology

## 2023-04-05 ENCOUNTER — Encounter: Payer: Self-pay | Admitting: Urology

## 2023-04-05 VITALS — BP 127/66 | HR 72

## 2023-04-05 DIAGNOSIS — R351 Nocturia: Secondary | ICD-10-CM | POA: Diagnosis not present

## 2023-04-05 DIAGNOSIS — R3915 Urgency of urination: Secondary | ICD-10-CM | POA: Diagnosis not present

## 2023-04-05 DIAGNOSIS — N401 Enlarged prostate with lower urinary tract symptoms: Secondary | ICD-10-CM

## 2023-04-05 LAB — URINALYSIS, ROUTINE W REFLEX MICROSCOPIC
Bilirubin, UA: NEGATIVE
Glucose, UA: NEGATIVE
Ketones, UA: NEGATIVE
Nitrite, UA: NEGATIVE
Protein,UA: NEGATIVE
Specific Gravity, UA: 1.015 (ref 1.005–1.030)
Urobilinogen, Ur: 0.2 mg/dL (ref 0.2–1.0)
pH, UA: 6 (ref 5.0–7.5)

## 2023-04-05 LAB — MICROSCOPIC EXAMINATION: Bacteria, UA: NONE SEEN

## 2023-04-05 MED ORDER — TAMSULOSIN HCL 0.4 MG PO CAPS: 0.4000 mg | ORAL_CAPSULE | Freq: Every day | ORAL | 11 refills | Status: DC

## 2023-04-05 NOTE — Progress Notes (Signed)
04/05/2023 1:55 PM   Scott Chang 07-Jul-1940 [id number]  Referring provider: Anabel Halon, MD [address] [place],  [place] [address]  BPH and urinary urgency   HPI: Scott Chang is a 82yo here for followup for BPh and urinary urgency. IPSS 8 QOL 4 on flomax 0.4mg  daily. He noted minimal improvement with mirabegron. Urine stream strong. No straining to urinate. He was previously on oxybutynin which caused worsening memory issues but did improve his urgency.    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   Arthritis    hands   Bee sting allergy 01/21/2022   Benign paroxysmal positional vertigo 09/21/2021   Benign prostatic hyperplasia with nocturia 09/21/2021   Callus of foot 09/21/2021   Chronic ear pain, bilateral 04/25/2022   Chronic fatigue 07/27/2018   DDD (degenerative disc disease), cervical 07/27/2018   DDD (degenerative disc disease), lumbar 07/27/2018   Dyspnea on exertion 04/18/2019   GAD (generalized anxiety disorder)    History of colonic polyps 07/27/2018   HLD (hyperlipidemia) 04/18/2019   Hypercholesteremia    Hypothyroidism 01/17/2022   Neuropathy    Osteoarthritis 01/17/2022   Urinary urgency 09/21/2021   Vitamin D deficiency 07/27/2018    Surgical History: Past Surgical History:  Procedure Laterality Date   APPENDECTOMY     CHOLECYSTECTOMY     COLONOSCOPY N/A 05/09/2013   Procedure: COLONOSCOPY;  Surgeon: Malissa Hippo, MD;  [facility]: [facility];  Service: Endoscopy;  Laterality: N/A;  1030   COLONOSCOPY W/ BIOPSIES AND POLYPECTOMY     Left knee arthroscopy     Left shoulder arthroscopy     X 2    Home Medications:  Allergies as of 04/05/2023       Reactions   Bee Venom Anaphylaxis   Penicillins Itching, Rash        Medication List        Accurate as of April 05, 2023  1:55 PM. If you have any questions, ask your  nurse or doctor.          augmented betamethasone dipropionate 0.05 % cream Commonly known as: DIPROLENE-AF Apply topically.   bacitracin-polymyxin b ophthalmic ointment Commonly known as: POLYSPORIN   clobetasol cream 0.05 % Commonly known as: TEMOVATE Apply 1 Application topically 2 (two) times daily.   donepezil 5 MG tablet Commonly known as: ARICEPT Take 1 tablet (5 mg total) by mouth at bedtime.   EPINEPHrine 0.3 mg/0.3 mL Soaj injection Commonly known as: EPI-PEN Inject 0.3 mg into the muscle as needed for anaphylaxis.   erythromycin ophthalmic ointment at bedtime.   levothyroxine 25 MCG tablet Commonly known as: SYNTHROID Take 1 tablet (25 mcg total) by mouth daily.   meclizine 25 MG tablet Commonly known as: ANTIVERT Take 1 tablet (25 mg total) by mouth 2 (two) times daily as needed for dizziness.   memantine 5 MG tablet Commonly known as: NAMENDA Take 1 tablet (5 mg total) by mouth 2 (two) times daily.   methylPREDNISolone 4 MG Tbpk tablet Commonly known as: MEDROL DOSEPAK Take as package instructions.   mirabegron ER 50 MG Tb24 tablet Commonly known as: MYRBETRIQ Take 1 tablet (50 mg total) by mouth daily.   multivitamin with minerals Tabs tablet Take 1 tablet by mouth daily.   SYSTANE COMPLETE OP Apply to eye.   tamsulosin 0.4 MG Caps capsule Commonly known as: FLOMAX Take 1 capsule (  0.4 mg total) by mouth daily after supper.   traZODone 50 MG tablet Commonly known as: DESYREL Take 1 tablet (50 mg total) by mouth at bedtime.        Allergies:  Allergies  Allergen Reactions   Bee Venom Anaphylaxis   Penicillins Itching and Rash    Family History: Family History  Problem Relation Age of Onset   Chronic Renal Failure Mother    Heart attack Father    Cancer Brother    Osteoarthritis Daughter    Colon cancer Neg Hx     Social History:  reports that he quit smoking about 34 years ago. His smoking use included cigarettes. He  started smoking about 74 years ago. He has a 40 pack-year smoking history. He has never used smokeless tobacco. He reports that he does not currently use alcohol. He reports that he does not use drugs.  ROS: All other review of systems were reviewed and are negative except what is noted above in HPI  Physical Exam: BP 127/66   Pulse 72   Constitutional:  Alert and oriented, No acute distress. HEENT:  AT, moist mucus membranes.  Trachea midline, no masses. Cardiovascular: No clubbing, cyanosis, or edema. Respiratory: Normal respiratory effort, no increased work of breathing. GI: Abdomen is soft, nontender, nondistended, no abdominal masses GU: No CVA tenderness.  Lymph: No cervical or inguinal lymphadenopathy. Skin: No rashes, bruises or suspicious lesions. Neurologic: Grossly intact, no focal deficits, moving all 4 extremities. Psychiatric: Normal mood and affect.  Laboratory Data: Lab Results  Component Value Date   WBC 7.8 03/02/2023   HGB 13.5 03/02/2023   HCT 41.9 03/02/2023   MCV 97 03/02/2023   PLT 187 03/02/2023    Lab Results  Component Value Date   CREATININE 0.81 03/02/2023    Lab Results  Component Value Date   PSA 0.2 10/09/2019    Lab Results  Component Value Date   TESTOSTERONE 2,515 (H) 08/12/2020    Lab Results  Component Value Date   HGBA1C 5.6 07/21/2022    Urinalysis    Component Value Date/Time   APPEARANCEUR Clear 10/05/2022 0959   GLUCOSEU Negative 10/05/2022 0959   BILIRUBINUR Negative 10/05/2022 0959   KETONESUR negative 09/21/2021 1054   PROTEINUR Negative 10/05/2022 0959   UROBILINOGEN 0.2 09/21/2021 1054   NITRITE Negative 10/05/2022 0959   LEUKOCYTESUR Trace (A) 10/05/2022 0959    Lab Results  Component Value Date   LABMICR See below: 10/05/2022   WBCUA 0-5 10/05/2022   LABEPIT 0-10 10/05/2022   BACTERIA None seen 10/05/2022    Pertinent Imaging:  No results found for this or any previous visit.  No results found  for this or any previous visit.  No results found for this or any previous visit.  No results found for this or any previous visit.  No results found for this or any previous visit.  No valid procedures specified. No results found for this or any previous visit.  No results found for this or any previous visit.   Assessment & Plan:    1. Benign prostatic hyperplasia with nocturia -continue flomax 0.4mg  daily - Urinalysis, Routine w reflex microscopic  2. Urinary urgency We will trial PTNS  No follow-ups on file.  Wilkie Aye, MD  [facility] [place]

## 2023-04-05 NOTE — Patient Instructions (Signed)

## 2023-04-18 ENCOUNTER — Ambulatory Visit: Payer: Medicare PPO

## 2023-04-25 ENCOUNTER — Ambulatory Visit: Payer: Medicare PPO

## 2023-04-25 DIAGNOSIS — R3915 Urgency of urination: Secondary | ICD-10-CM

## 2023-04-25 NOTE — Progress Notes (Signed)
PTNS  Session # 1 of 12  Patient Goals: reduce urgency, frequency, and sleep throughout the night.  Health & Social Factors: n/a Caffeine: 2 Alcohol: 0 Daytime voids #per day: 4 Night-time voids #per night: 3-5 Urgency: severe Incontinence Episodes #per day: 1 Treatment Plan/Comments: urge reduction techniques  Ankle used: right Treatment Setting: 5 Feeling/ Response: toe flex   Performed By: [facility] LPN  Follow Up: keep scheduled NV

## 2023-04-25 NOTE — Patient Instructions (Signed)

## 2023-05-01 ENCOUNTER — Other Ambulatory Visit: Payer: Self-pay | Admitting: Internal Medicine

## 2023-05-01 ENCOUNTER — Ambulatory Visit (INDEPENDENT_AMBULATORY_CARE_PROVIDER_SITE_OTHER): Payer: Medicare PPO

## 2023-05-01 DIAGNOSIS — H8110 Benign paroxysmal vertigo, unspecified ear: Secondary | ICD-10-CM

## 2023-05-01 DIAGNOSIS — N3281 Overactive bladder: Secondary | ICD-10-CM

## 2023-05-01 NOTE — Progress Notes (Signed)
PTNS  Session # 2 of 12  Patient Goals: no accidents  Health & Social Factors: no change Caffeine: 2 Alcohol: 0 Daytime voids #per day: 3 Night-time voids #per night: 2 Urgency: mild Incontinence Episodes #per day: n/a Treatment Plan/Comments: reduce caffiene and urge reduction techniques  Ankle used: left Treatment Setting: 2 Feeling/ Response: toe flex and sensory   Performed By: Guss Bunde, CMA  Follow Up: as scheduled.

## 2023-05-01 NOTE — Patient Instructions (Signed)

## 2023-05-08 ENCOUNTER — Ambulatory Visit (INDEPENDENT_AMBULATORY_CARE_PROVIDER_SITE_OTHER): Payer: Medicare PPO | Admitting: Internal Medicine

## 2023-05-08 ENCOUNTER — Encounter: Payer: Self-pay | Admitting: Internal Medicine

## 2023-05-08 VITALS — BP 127/71 | HR 79 | Ht 70.0 in | Wt 163.8 lb

## 2023-05-08 DIAGNOSIS — F411 Generalized anxiety disorder: Secondary | ICD-10-CM | POA: Diagnosis not present

## 2023-05-08 DIAGNOSIS — R5382 Chronic fatigue, unspecified: Secondary | ICD-10-CM

## 2023-05-08 DIAGNOSIS — N401 Enlarged prostate with lower urinary tract symptoms: Secondary | ICD-10-CM | POA: Diagnosis not present

## 2023-05-08 DIAGNOSIS — R351 Nocturia: Secondary | ICD-10-CM | POA: Diagnosis not present

## 2023-05-08 DIAGNOSIS — H5713 Ocular pain, bilateral: Secondary | ICD-10-CM

## 2023-05-08 DIAGNOSIS — E782 Mixed hyperlipidemia: Secondary | ICD-10-CM

## 2023-05-08 DIAGNOSIS — E039 Hypothyroidism, unspecified: Secondary | ICD-10-CM

## 2023-05-08 NOTE — Assessment & Plan Note (Signed)
Last lipid profile reviewed Had new onset hypothyroidism Recently started Levothyroxine for hypothyroidism Advised to follow low cholesterol diet for now

## 2023-05-08 NOTE — Assessment & Plan Note (Signed)
Lab Results  Component Value Date   TSH 3.280 03/02/2023   With normal free T4 On Levothyroxine 25 mcg QD as he had symptoms - chronic fatigue and memory changes Check TSH and free T4 after 4 months

## 2023-05-08 NOTE — Patient Instructions (Signed)
Please continue to take medications as prescribed.  Please continue to eat at regular intervals and ambulate as tolerated.

## 2023-05-08 NOTE — Progress Notes (Signed)
Established Patient Office Visit  Subjective:  Patient ID: Scott Chang, male    DOB: 02-07-1941  Age: 82 y.o. MRN: [id number]  CC:  Chief Complaint  Patient presents with   Hypothyroidism    Four month follow up    Insomnia    Four month follow up     HPI Scott Chang is a 82 y.o. male with past medical history of OA, DDD of lumbar spine and BPPV who presents for f/u of GAD and insomnia.  GAD: He was having constant anxiety at night time and insomnia.  He was given trazodone 50 mg nightly, which has improved his sleep quality.  He still wakes up at nighttime for urinating.  His anxiety and fatigue had also slightly improved due to better sleep quality. He has been feeling fatigued and lack of interest in activities for the last 2 years since his wife passed away. He still denies anhedonia, but has had insomnia. Denies any SI or HI currently.  Hypothyroidism: He was placed on levothyroxine 25 mcg QD.  He had seen slight improvement in fatigue initially, but has started feeling fatigued again.  Denies any recent change in weight or appetite.  HLD: His cholesterol level is still elevated.  He has been following low cholesterol diet.  He is physically active as well.  He also reports bilateral eye "tiredness and achiness" since 03/08/23, but denies any redness or discharge. Denies visual disturbance currently. He recently had Optometry evaluation, but has not noticed any improvement with new glasses.  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   Arthritis    hands   Bee sting allergy 01/21/2022   Benign paroxysmal positional vertigo 09/21/2021   Benign prostatic hyperplasia with nocturia 09/21/2021   Callus of foot 09/21/2021   Chronic ear pain, bilateral 04/25/2022   Chronic fatigue 07/27/2018   DDD (degenerative disc disease), cervical 07/27/2018   DDD (degenerative disc  disease), lumbar 07/27/2018   Dyspnea on exertion 04/18/2019   GAD (generalized anxiety disorder)    History of colonic polyps 07/27/2018   HLD (hyperlipidemia) 04/18/2019   Hypercholesteremia    Hypothyroidism 01/17/2022   Neuropathy    Osteoarthritis 01/17/2022   Urinary urgency 09/21/2021   Vitamin D deficiency 07/27/2018    Past Surgical History:  Procedure Laterality Date   APPENDECTOMY     CHOLECYSTECTOMY     COLONOSCOPY N/A 05/09/2013   Procedure: COLONOSCOPY;  Surgeon: Malissa Hippo, MD;  Location: [facility];  Service: Endoscopy;  Laterality: N/A;  1030   COLONOSCOPY W/ BIOPSIES AND POLYPECTOMY     Left knee arthroscopy     Left shoulder arthroscopy     X 2    Family History  Problem Relation Age of Onset   Chronic Renal Failure Mother    Heart attack Father    Cancer Brother    Osteoarthritis Daughter    Colon cancer Neg Hx     Social History   Socioeconomic History   Marital status: Widowed    Spouse name: Not on file   Number of children: Not on file   Years of education: 12   Highest education level: High school graduate  Occupational History   Occupation: Retired    Comment: Personnel officer, lineman  Tobacco Use   Smoking status: Former    Current packs/day: 0.00    Average packs/day: 1 pack/day for 40.0 years (40.0  ttl pk-yrs)    Types: Cigarettes    Start date: 06/21/1948    Quit date: 06/21/1988    Years since quitting: 34.9   Smokeless tobacco: Never  Vaping Use   Vaping status: Never Used  Substance and Sexual Activity   Alcohol use: Not Currently   Drug use: No   Sexual activity: Not on file  Other Topics Concern   Not on file  Social History Narrative   Married for 52 years.Retired ,previously in Holiday representative.   Right handed    Drinks caffeine   One floor home   Lives [place]   Social Determinants of Health   Financial Resource Strain: Low Risk  (01/17/2022)   Overall Financial Resource Strain (CARDIA)    Difficulty of  Paying Living Expenses: Not hard at all  Food Insecurity: No Food Insecurity (01/17/2022)   Hunger Vital Sign    Worried About Running Out of Food in the Last Year: Never true    Ran Out of Food in the Last Year: Never true  Transportation Needs: No Transportation Needs (01/17/2022)   PRAPARE - Administrator, Civil Service (Medical): No    Lack of Transportation (Non-Medical): No  Physical Activity: Sufficiently Active (01/17/2022)   Exercise Vital Sign    Days of Exercise per Week: 5 days    Minutes of Exercise per Session: 140 min  Stress: Stress Concern Present (01/17/2022)   [employer] of Occupational Health - Occupational Stress Questionnaire    Feeling of Stress : To some extent  Social Connections: Socially Isolated (01/17/2022)   Social Connection and Isolation Panel [NHANES]    Frequency of Communication with Friends and Family: More than three times a week    Frequency of Social Gatherings with Friends and Family: More than three times a week    Attends Religious Services: Never    Database administrator or Organizations: No    Attends Banker Meetings: Never    Marital Status: Widowed  Intimate Partner Violence: Not At Risk (01/17/2022)   Humiliation, Afraid, Rape, and Kick questionnaire    Fear of Current or Ex-Partner: No    Emotionally Abused: No    Physically Abused: No    Sexually Abused: No    Outpatient Medications Prior to Visit  Medication Sig Dispense Refill   augmented betamethasone dipropionate (DIPROLENE-AF) 0.05 % cream Apply topically.     bacitracin-polymyxin b (POLYSPORIN) ophthalmic ointment      clobetasol cream (TEMOVATE) 0.05 % Apply 1 Application topically 2 (two) times daily. 30 g 0   donepezil (ARICEPT) 5 MG tablet Take 1 tablet (5 mg total) by mouth at bedtime. (Patient not taking: Reported on 05/01/2023) 30 tablet 11   EPINEPHrine 0.3 mg/0.3 mL IJ SOAJ injection Inject 0.3 mg into the muscle as needed for  anaphylaxis. 1 each 1   erythromycin ophthalmic ointment at bedtime.     levothyroxine (SYNTHROID) 25 MCG tablet Take 1 tablet (25 mcg total) by mouth daily. 90 tablet 1   meclizine (ANTIVERT) 25 MG tablet TAKE (1) TABLET BY MOUTH TWICE A DAY AS NEEDED. 30 tablet 1   memantine (NAMENDA) 5 MG tablet Take 1 tablet (5 mg total) by mouth 2 (two) times daily. 60 tablet 11   mirabegron ER (MYRBETRIQ) 50 MG TB24 tablet Take 1 tablet (50 mg total) by mouth daily. (Patient not taking: Reported on 05/01/2023) 30 tablet 11   Multiple Vitamin (MULTIVITAMIN WITH MINERALS) TABS tablet Take 1 tablet by mouth  daily.     Propylene Glycol (SYSTANE COMPLETE OP) Apply to eye.     tamsulosin (FLOMAX) 0.4 MG CAPS capsule Take 1 capsule (0.4 mg total) by mouth daily after supper. 30 capsule 11   traZODone (DESYREL) 50 MG tablet Take 1 tablet (50 mg total) by mouth at bedtime. 30 tablet 3   methylPREDNISolone (MEDROL DOSEPAK) 4 MG TBPK tablet Take as package instructions. (Patient not taking: Reported on 05/01/2023) 1 each 0   No facility-administered medications prior to visit.    Allergies  Allergen Reactions   Bee Venom Anaphylaxis   Penicillins Itching and Rash    ROS Review of Systems  Constitutional:  Positive for fatigue. Negative for chills and fever.  HENT:  Negative for congestion and sore throat.        B/l ear discomfort  Eyes:  Negative for discharge and redness.  Respiratory:  Negative for cough and shortness of breath.   Cardiovascular:  Negative for chest pain and palpitations.  Gastrointestinal:  Negative for diarrhea, nausea and vomiting.  Endocrine: Negative for polydipsia and polyuria.  Genitourinary:  Positive for urgency. Negative for dysuria and hematuria.       Nocturia  Musculoskeletal:  Negative for neck pain and neck stiffness.  Skin:  Negative for rash.  Neurological:  Negative for weakness, numbness and headaches.  Psychiatric/Behavioral:  Negative for agitation and  behavioral problems. The patient is nervous/anxious.       Objective:    Physical Exam Vitals reviewed.  Constitutional:      General: He is not in acute distress.    Appearance: He is not diaphoretic.  HENT:     Head: Normocephalic and atraumatic.     Nose: Nose normal.     Mouth/Throat:     Mouth: Mucous membranes are moist.  Eyes:     General: No scleral icterus.    Extraocular Movements: Extraocular movements intact.  Cardiovascular:     Rate and Rhythm: Normal rate and regular rhythm.     Heart sounds: Normal heart sounds. No murmur heard. Pulmonary:     Breath sounds: Normal breath sounds. No wheezing or rales.  Musculoskeletal:     Cervical back: Neck supple. No tenderness.     Right lower leg: No edema.     Left lower leg: No edema.  Skin:    General: Skin is warm.     Findings: Lesion (Actinic keratoses - over right arm near elbow and hip area) present. No rash.  Neurological:     General: No focal deficit present.     Mental Status: He is alert and oriented to person, place, and time.     Cranial Nerves: No cranial nerve deficit.     Sensory: No sensory deficit.     Motor: No weakness.  Psychiatric:        Mood and Affect: Mood normal.        Behavior: Behavior normal.     BP 127/71 (BP Location: Left Arm, Patient Position: Sitting, Cuff Size: Normal)   Pulse 79   Ht 5\' 10"  (1.778 m)   Wt 163 lb 12.8 oz (74.3 kg)   SpO2 98%   BMI 23.50 kg/m  Wt Readings from Last 3 Encounters:  05/08/23 163 lb 12.8 oz (74.3 kg)  01/04/23 162 lb 6.4 oz (73.7 kg)  12/05/22 159 lb (72.1 kg)    Lab Results  Component Value Date   TSH 3.280 03/02/2023   Lab Results  Component Value Date  WBC 7.8 03/02/2023   HGB 13.5 03/02/2023   HCT 41.9 03/02/2023   MCV 97 03/02/2023   PLT 187 03/02/2023   Lab Results  Component Value Date   NA 136 03/02/2023   K 4.3 03/02/2023   CO2 22 03/02/2023   GLUCOSE 90 03/02/2023   BUN 17 03/02/2023   CREATININE 0.81  03/02/2023   BILITOT 0.4 03/02/2023   ALKPHOS 78 03/02/2023   AST 29 03/02/2023   ALT 26 03/02/2023   PROT 6.5 03/02/2023   ALBUMIN 4.0 03/02/2023   CALCIUM 9.0 03/02/2023   EGFR 89 03/02/2023   Lab Results  Component Value Date   CHOL 239 (H) 12/21/2022   Lab Results  Component Value Date   HDL 46 12/21/2022   Lab Results  Component Value Date   LDLCALC 142 (H) 12/21/2022   Lab Results  Component Value Date   TRIG 282 (H) 12/21/2022   Lab Results  Component Value Date   CHOLHDL 5.2 (H) 12/21/2022   Lab Results  Component Value Date   HGBA1C 5.6 07/21/2022      Assessment & Plan:   Problem List Items Addressed This Visit       Endocrine   Hypothyroidism - Primary    Lab Results  Component Value Date   TSH 3.280 03/02/2023   With normal free T4 On Levothyroxine 25 mcg QD as he had symptoms - chronic fatigue and memory changes Check TSH and free T4 after 4 months        Other   GAD (generalized anxiety disorder) (Chronic)    Had anxiety and insomnia On Trazodone 50 mg qHS PRN for insomnia, now improved Sleep hygiene discussed      Chronic fatigue    Was placed on NP thyroid and testosterone in the past by previous PCP, but he did not like it and stopped taking them Unclear etiology, but 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 since 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 passed away - his fatigue, insomnia and memory concerns have appeared since then Checked TSH and free T4, CBC Recent CMP reviewed - unremarkable      HLD (hyperlipidemia)    Last lipid profile reviewed Had new onset hypothyroidism Recently started Levothyroxine for hypothyroidism Advised to follow low cholesterol diet for now      Benign prostatic hyperplasia with nocturia    On Flomax for BPH On Myrbetriq for urinary incontinence/urgency, but he has stopped it Followed by Urology      Eye pain, bilateral    Unclear etiology Denies drooping of eyelids, but reports tiredness (?) of eyelids Referred to  Ophthalmology      Relevant Orders   Ambulatory referral to Ophthalmology      No orders of the defined types were placed in this encounter.   Follow-up: Return in about 4 months (around 09/05/2023) for Annual physical.    Anabel Halon, MD

## 2023-05-08 NOTE — Assessment & Plan Note (Signed)
Had anxiety and insomnia On Trazodone 50 mg qHS PRN for insomnia, now improved Sleep hygiene discussed

## 2023-05-09 ENCOUNTER — Ambulatory Visit: Payer: Medicare PPO

## 2023-05-09 DIAGNOSIS — R3915 Urgency of urination: Secondary | ICD-10-CM | POA: Diagnosis not present

## 2023-05-09 DIAGNOSIS — N3281 Overactive bladder: Secondary | ICD-10-CM

## 2023-05-09 NOTE — Patient Instructions (Signed)

## 2023-05-09 NOTE — Progress Notes (Signed)
PTNS  Session # 3 of 12  Patient Goals: reduce urgency, no accidents, sleep through the night  Health & Social Factors: n/a Caffeine: 2 Alcohol: 0 Daytime voids #per day: 3-4 Night-time voids #per night: 3-4 Urgency: strong Incontinence Episodes #per day: 1 weekly Treatment Plan/Comments: urge reduction techniques  Ankle used: right Treatment Setting: 6 Feeling/ Response: sensation in heel   Performed By: [facility] LPN   Follow Up: keep scheduled nv

## 2023-05-12 DIAGNOSIS — H5713 Ocular pain, bilateral: Secondary | ICD-10-CM | POA: Insufficient documentation

## 2023-05-12 NOTE — Assessment & Plan Note (Signed)
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

## 2023-05-12 NOTE — Assessment & Plan Note (Signed)
Unclear etiology Denies drooping of eyelids, but reports tiredness (?) of eyelids Referred to Ophthalmology

## 2023-05-12 NOTE — Assessment & Plan Note (Addendum)
On Flomax for BPH On Myrbetriq for urinary incontinence/urgency, but he has stopped it Followed by Urology

## 2023-05-15 ENCOUNTER — Telehealth: Payer: Self-pay

## 2023-05-15 NOTE — Telephone Encounter (Signed)
Copied from CRM [id number]. Topic: Referral - Question >> May 15, 2023  2:35 PM Larwance Sachs wrote: Reason for CRM: Patient representative called in regarding referral sent to Dr. Vonna Kotyk office in [place]. Stated she spoke with [place] and they advised her they DO NOT take verbal or electronic referrals and they have to be called in. Patient representative ask for a call back at [phone]

## 2023-05-16 ENCOUNTER — Ambulatory Visit: Payer: Medicare PPO

## 2023-05-16 DIAGNOSIS — N3281 Overactive bladder: Secondary | ICD-10-CM

## 2023-05-16 NOTE — Progress Notes (Addendum)
PTNS  Session # 4 OF 12  Patient Goals: reduce urgency, no accidents, sleep through the night   Health & Social Factors: n/a Caffeine: 2 Alcohol: 0 Daytime voids #per day: 3-4 Night-time voids #per night: 3-4 Urgency: strong Incontinence Episodes #per day: 1 weekly Treatment Plan/Comments: urge reduction techniques  Ankle used: right Treatment Setting: 2 Feeling/ Response: positive sensation   Performed By: [facility] LPN  Follow Up: Keep scheduled NV

## 2023-05-23 ENCOUNTER — Ambulatory Visit (INDEPENDENT_AMBULATORY_CARE_PROVIDER_SITE_OTHER): Payer: Medicare PPO

## 2023-05-23 DIAGNOSIS — N3281 Overactive bladder: Secondary | ICD-10-CM

## 2023-05-23 NOTE — Patient Instructions (Signed)

## 2023-05-23 NOTE — Progress Notes (Addendum)
PTNS  Session # 5 of 12  Patient Goals: no accidents, sleep through the night  Health & Social Factors: no change Caffeine: 2 Alcohol: 0 Daytime voids #per day: 4 Night-time voids #per night: 3 Urgency: strong Incontinence Episodes #per day: n/a Treatment Plan/Comments: n,a  Ankle used: left Treatment Setting: 2 Feeling/ Response: sensory and toe flex   Performed By: Guss Bunde, CMA  Follow Up: as schedule.

## 2023-05-30 ENCOUNTER — Ambulatory Visit (INDEPENDENT_AMBULATORY_CARE_PROVIDER_SITE_OTHER): Payer: Medicare PPO

## 2023-05-30 DIAGNOSIS — N3281 Overactive bladder: Secondary | ICD-10-CM | POA: Diagnosis not present

## 2023-05-30 NOTE — Progress Notes (Signed)
PTNS  Session # 6 of 12  Patient Goals: no accidents, sleep through the night   Health & Social Factors: no change Caffeine: 2 Alcohol: 0 Daytime voids #per day: 4 Night-time voids #per night: 3 Urgency: strong Incontinence Episodes #per day: n/a Treatment Plan/Comments: n/a  Ankle used: right Treatment Setting: 2 Feeling/ Response: positive sensation in toes   Performed By: [facility] LPN  Follow Up: keep scheduled NV

## 2023-06-06 ENCOUNTER — Encounter: Payer: Self-pay | Admitting: Physician Assistant

## 2023-06-06 ENCOUNTER — Ambulatory Visit (INDEPENDENT_AMBULATORY_CARE_PROVIDER_SITE_OTHER): Payer: Medicare PPO | Admitting: Physician Assistant

## 2023-06-06 VITALS — BP 132/70 | HR 79 | Resp 20 | Ht 70.0 in | Wt 165.0 lb

## 2023-06-06 DIAGNOSIS — G3184 Mild cognitive impairment, so stated: Secondary | ICD-10-CM | POA: Diagnosis not present

## 2023-06-06 MED ORDER — MEMANTINE HCL 5 MG PO TABS: 5.0000 mg | ORAL_TABLET | Freq: Two times a day (BID) | ORAL | 11 refills | Status: DC

## 2023-06-06 NOTE — Progress Notes (Signed)
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 Patient is currently on memantine 5 mg nightly. Patient is able to participate on his ADLs and to drive without difficulties  .     Follow up in 6  months. Increase to 5 mg twice daily. Side effects were discussed Repeat Neuropsych testing for clarity of diagnosis and disease trajectory in about 9 -15 months    Recommend good control of her cardiovascular risk factors. Patient informed of his BP readings Continue to control mood as per PCP     Subjective:    This patient is accompanied in the office by his daughter who supplements the history.  Previous records as well as any outside records available were reviewed prior to todays visit. Patient was last seen on 12/05/22 with MMSE 24/30    Any changes in memory since last visit? " It has gotten worse".Patient has some difficulty remembering recent conversations, new information and people names. He enjoys working on his vehicles during the day and cleans his house.  repeats oneself?  Endorsed Disoriented when walking into a room?  Patient denies    Leaving objects?  May misplace things such as the wallet and cannot find it for days, but not in unusual places.   Wandering behavior?  Denies.   Any personality changes since last visit? The memory issues affecting him.  Any worsening depression?:  Denies.   Hallucinations or paranoia?  Denies.   Seizures? denies    Any sleep changes?  Sleeps better since trazodone. Denies vivid dreams, REM behavior or sleepwalking   Sleep apnea?   Denies.   Any hygiene concerns? Denies.  Independent of bathing and dressing?   Endorsed  Does the patient needs help with medications?  Patient is in charge   Who is in charge of the finances?  Patient is in charge     Any changes in appetite?  Sometimes he forgets to eat.  Patient have trouble swallowing? Denies.   Does the patient cook? Not much Any headaches?  Denies   vision changes? He has dry eyes and has appointment with ophthalmology due to concern of issues with his lenses which may cause a change in perception Chronic back pain  denies   Ambulates with difficulty? chronic arthritis knee which may limit his mobility "I don't want to go through surgery".    Recent falls or head injuries? denies     Unilateral weakness, numbness or tingling? denies   Any tremors?  Denies   Any anosmia?  Denies   Any incontinence of urine?  Endorsed, urge incontinence/ OAB. Doing bladder therapy Any bowel dysfunction?   Denies      Patient lives alone since the death of his wife in 12-Jun-2021   Does the patient drive? Endorsed, denies getting lost or other issues with driving     Initial visit 05/02/2022  How long did patient have memory difficulties? For the last  3-4 years, when he began to  notice difficulty remembering recent conversations and names of people. Initially this was attributed to age but since his wife death 1 year ago, his memory worsened and now he is "not  sure if grief contributed to memory or not, but it is worse"-he says. . Able to do ADLs and likes to work on his vehicles an does acres or yard work. He does not like to do any crossword puzzles or word finding. repeats oneself?" Not so much of that " Disoriented when walking into a room?  Patient denies, but at times he forgets what he came to the room for Leaving objects in unusual places?  Patient denies, but does forget where he left common objects such as remote, keys and phone.   Patient lives  alone since his wife died on May 20, 2021. Ambulates  with difficulty?   Patient denies   Recent falls?  Patient  denies   Any head injuries?  Patient denies   History of seizures?   Patient denies   Wandering behavior?  Patient denies   Patient drives?  No issues. "I still do a great job".  Any mood changes ? He denies Any depression?:  Patient denies a history of, but his wife died 1 year ago and at times he feels sad, he is dealing with grief.  Hallucinations?  Patient denies   Paranoia?  Patient denies   Denies vivid dreams, REM behavior or sleepwalking. He has insomnia, does not feel rested in most instances .    History of sleep apnea?  Patient denies   Any hygiene concerns?  Patient denies   Independent of bathing and dressing?  Endorsed  Does the patient needs help with medications? Patient in charge  Occasionally misses doses  Who is in charge of the finances?  Patient  is in charge   Any changes in appetite?  Patient denies   Trying to eliminate some foods of the diet due to skin rashes followed by Dermatology Patient have trouble swallowing? Patient denies   Does the patient cook?  Patient denies   Any kitchen accidents such as leaving the stove on? Patient denies   Any headaches?  Patient denies   Double vision? Patient denies   Any focal numbness or tingling?  Patient denies   Chronic back pain Patient denies   Unilateral weakness?  Patient denies   Any tremors?  Patient denies   Any history of anosmia?  Patient denies   Any incontinence of urine? Urgency incontinence and BPH, on Ditropan Any bowel dysfunction?   Patient denies   History of heavy alcohol intake?  Patient denies   History of heavy tobacco use?  Patient denies   Family history of dementia?   Unknown, parents died very young. Alzheimer's disease     Labs: TSH  6.5, nl T4 1.06   05/27/2022 MRI brain, personally reviewed, remarkable for sequela of mild chronic microvascular ischemic changes with age-appropriate volume loss without lobar predominance.  There is unchanged appearance of the posterior pituitary, including a  known 5 mm T1 hyperintense lesion at the base of the sella.    PREVIOUS MEDICATIONS: donepezil 5 mg , dizziness and insomnia.   PREVIOUS MEDICATIONS:   CURRENT MEDICATIONS:  Outpatient Encounter Medications as of 06/06/2023  Medication Sig   augmented betamethasone dipropionate (DIPROLENE-AF) 0.05 % cream Apply topically.   bacitracin-polymyxin b (POLYSPORIN) ophthalmic ointment    clobetasol cream (TEMOVATE) 0.05 % Apply 1 Application topically 2 (two) times daily.   EPINEPHrine 0.3 mg/0.3 mL IJ SOAJ injection Inject 0.3 mg into the muscle as needed for anaphylaxis.   levothyroxine (SYNTHROID) 25 MCG tablet Take 1 tablet (25 mcg total) by mouth daily.   meclizine (  ANTIVERT) 25 MG tablet TAKE (1) TABLET BY MOUTH TWICE A DAY AS NEEDED.   Multiple Vitamin (MULTIVITAMIN WITH MINERALS) TABS tablet Take 1 tablet by mouth daily.   Propylene Glycol (SYSTANE COMPLETE OP) Apply to eye.   tamsulosin (FLOMAX) 0.4 MG CAPS capsule Take 1 capsule (0.4 mg total) by mouth daily after supper.   traZODone (DESYREL) 50 MG tablet Take 1 tablet (50 mg total) by mouth at bedtime.   [DISCONTINUED] memantine (NAMENDA) 5 MG tablet Take 1 tablet (5 mg total) by mouth 2 (two) times daily.   memantine (NAMENDA) 5 MG tablet Take 1 tablet (5 mg total) by mouth 2 (two) times daily.   No facility-administered encounter medications on file as of 06/06/2023.       12/05/2022   12:00 PM  MMSE - Mini Mental State Exam  Orientation to time 5  Orientation to Place 4  Registration 3  Attention/ Calculation 4  Recall 0  Language- name 2 objects 2  Language- repeat 1  Language- follow 3 step command 3  Language- read & follow direction 1  Write a sentence 1  Copy design 0  Total score 24      05/03/2022    9:52 AM 05/02/2022    8:00 AM  Montreal Cognitive Assessment   Visuospatial/ Executive (0/5) 1 1  Naming (0/3) 3 3  Attention: Read list of digits (0/2) 2 2  Attention: Read list of letters (0/1) 1 1   Attention: Serial 7 subtraction starting at 100 (0/3) 1 1  Language: Repeat phrase (0/2) 2 2  Language : Fluency (0/1) 0 0  Abstraction (0/2) 1 1  Delayed Recall (0/5) 0 0  Orientation (0/6) 6 6  Total 17 17  Adjusted Score (based on education) 18 17    Objective:     PHYSICAL EXAMINATION:    VITALS:   Vitals:   06/06/23 0852 06/06/23 0934  BP: (!) 153/63 132/70  Pulse: 79   Resp: 20   SpO2: 98%   Weight: 165 lb (74.8 kg)   Height: 5\' 10"  (1.778 m)     GEN:  The patient appears stated age and is in NAD. HEENT:  Normocephalic, atraumatic.   Neurological examination:  General: NAD, well-groomed, appears stated age. Orientation: The patient is alert. Oriented to person, place and not to date Cranial nerves: There is good facial symmetry.The speech is fluent and clear. No aphasia or dysarthria. Fund of knowledge is appropriate. Recent and remote memory are impaired. Attention and concentration are reduced.  Able to name objects and repeat phrases.  Hearing is intact to conversational tone.  Sensation: Sensation is intact to light touch throughout Motor: Strength is at least antigravity x4. DTR's 2/4 in UE/LE     Movement examination: Tone: There is normal tone in the UE/LE Abnormal movements:  no tremor.  No myoclonus.  No asterixis.   Coordination:  There is no decremation with RAM's. Normal finger to nose  Gait and Station: The patient has no difficulty arising out of a deep-seated chair without the use of the hands. The patient's stride length is good except for pain on R knee causing limitation of mobility.  Gait is cautious and narrow.    Thank you for allowing [place] the opportunity to participate in the care of this nice patient. Please do not hesitate to contact us for any questions or concerns.   Total time spent on today's visit was 30 minutes dedicated to this patient today, preparing to see  patient, examining the patient, ordering tests and/or medications and  counseling the patient, documenting clinical information in the EHR or other health record, independently interpreting results and communicating results to the patient/family, discussing treatment and goals, answering patient's questions and coordinating care.  Cc:  Anabel Halon, MD  Marlowe Kays 06/06/2023 12:20 PM

## 2023-06-06 NOTE — Patient Instructions (Signed)
It was a pleasure to see you today at our office.   Recommendations:  Follow up in 6   months Increase  memantine 5 mg twice daily  Recommend following with Ortho regarding R knee pain  Agree with eye doctor regarding vision issues      For assessment of decision of mental capacity and competency:  Call Dr. Erick Blinks, geriatric psychiatrist at [phone] Counseling regarding caregiver distress,  including caregiver depression, anxiety and issues regarding community resources, adult day care programs, adult living facilities, or memory care questions:  please contact your  Primary Doctor's Social Worker   Whom to call: Memory  decline, memory medications: Call our office [phone]   For psychiatric meds, mood meds: Please have your primary care physician manage these medications.  If you have any severe symptoms of a stroke, or other severe issues such as confusion,severe chills or fever, etc call 911 or go to the ER as you may need to be evaluated further       RECOMMENDATIONS FOR ALL PATIENTS WITH MEMORY PROBLEMS: 1. Continue to exercise (Recommend 30 minutes of walking everyday, or 3 hours every week) 2. Increase social interactions - continue going to [place] and enjoy social gatherings with friends and family 3. Eat healthy, avoid fried foods and eat more fruits and vegetables 4. Maintain adequate blood pressure, blood sugar, and blood cholesterol level. Reducing the risk of stroke and cardiovascular disease also helps promoting better memory. 5. Avoid stressful situations. Live a simple life and avoid aggravations. Organize your time and prepare for the next day in anticipation. 6. Sleep well, avoid any interruptions of sleep and avoid any distractions in the bedroom that may interfere with adequate sleep quality 7. Avoid sugar, avoid sweets as there is a strong link between excessive sugar intake, diabetes, and cognitive impairment We discussed the Mediterranean diet,  which has been shown to help patients reduce the risk of progressive memory disorders and reduces cardiovascular risk. This includes eating fish, eat fruits and green leafy vegetables, nuts like almonds and hazelnuts, walnuts, and also use olive oil. Avoid fast foods and fried foods as much as possible. Avoid sweets and sugar as sugar use has been linked to worsening of memory function.  There is always a concern of gradual progression of memory problems. If this is the case, then we may need to adjust level of care according to patient needs. Support, both to the patient and caregiver, should then be put into place.    FALL PRECAUTIONS: Be cautious when walking. Scan the area for obstacles that may increase the risk of trips and falls. When getting up in the mornings, sit up at the edge of the bed for a few minutes before getting out of bed. Consider elevating the bed at the head end to avoid drop of blood pressure when getting up. Walk always in a well-lit room (use night lights in the walls). Avoid area rugs or power cords from appliances in the middle of the walkways. Use a walker or a cane if necessary and consider physical therapy for balance exercise. Get your eyesight checked regularly.  FINANCIAL OVERSIGHT: Supervision, especially oversight when making financial decisions or transactions is also recommended.  HOME SAFETY: Consider the safety of the kitchen when operating appliances like stoves, microwave oven, and blender. Consider having supervision and share cooking responsibilities until no longer able to participate in those. Accidents with firearms and other hazards in the house should be identified and addressed as well.  ABILITY TO BE LEFT ALONE: If patient is unable to contact 911 operator, consider using LifeLine, or when the need is there, arrange for someone to stay with patients. Smoking is a fire hazard, consider supervision or cessation. Risk of wandering should be assessed by  caregiver and if detected at any point, supervision and safe proof recommendations should be instituted.  MEDICATION SUPERVISION: Inability to self-administer medication needs to be constantly addressed. Implement a mechanism to ensure safe administration of the medications.   DRIVING: Regarding driving, in patients with progressive memory problems, driving will be impaired. We advise to have someone else do the driving if trouble finding directions or if minor accidents are reported. Independent driving assessment is available to determine safety of driving.   If you are interested in the driving assessment, you can contact the following:  [facility] [employer] [facility] in [place] [phone]  [facility] [phone]  [facility] [phone]  [facility] [phone] or [phone]

## 2023-06-07 ENCOUNTER — Ambulatory Visit: Payer: Medicare PPO

## 2023-06-07 DIAGNOSIS — R3915 Urgency of urination: Secondary | ICD-10-CM

## 2023-06-07 NOTE — Progress Notes (Signed)
PTNS  Session # 7  Health & Social Factors: no change Caffeine: 2 Alcohol: 0 Daytime voids #per day: 3-4 Night-time voids #per night: 3 Urgency: mild Incontinence Episodes #per day: 0 Ankle used: left Treatment Setting: 2 Feeling/ Response: Sensory Comments: N/A  Performed By: Gwendolyn Grant CMA  Follow Up: Keep scheduled NV

## 2023-06-07 NOTE — Patient Instructions (Signed)

## 2023-06-17 ENCOUNTER — Other Ambulatory Visit: Payer: Self-pay | Admitting: Internal Medicine

## 2023-06-17 DIAGNOSIS — E039 Hypothyroidism, unspecified: Secondary | ICD-10-CM

## 2023-06-20 ENCOUNTER — Ambulatory Visit (INDEPENDENT_AMBULATORY_CARE_PROVIDER_SITE_OTHER): Payer: Medicare PPO

## 2023-06-20 DIAGNOSIS — N3281 Overactive bladder: Secondary | ICD-10-CM

## 2023-06-20 DIAGNOSIS — R3915 Urgency of urination: Secondary | ICD-10-CM

## 2023-06-20 NOTE — Patient Instructions (Signed)
 Marland Kitchen

## 2023-06-20 NOTE — Progress Notes (Signed)
 PTNS  Session # 8  Health & Social Factors: no change Caffeine: 2 Alcohol : 0 Daytime voids #per day: 3-4 Night-time voids #per night: 3-4 Urgency: strong Incontinence Episodes #per day: N/A Ankle used: Right Treatment Setting: 2 Feeling/ Response: Toe Flex   Performed By: Exie CMA  Follow Up: As scheduled

## 2023-06-27 ENCOUNTER — Other Ambulatory Visit: Payer: Self-pay | Admitting: Internal Medicine

## 2023-06-27 DIAGNOSIS — H8110 Benign paroxysmal vertigo, unspecified ear: Secondary | ICD-10-CM

## 2023-06-29 ENCOUNTER — Ambulatory Visit: Payer: Medicare PPO

## 2023-07-06 ENCOUNTER — Ambulatory Visit: Payer: Medicare PPO

## 2023-07-13 ENCOUNTER — Ambulatory Visit: Payer: Medicare PPO

## 2023-07-20 ENCOUNTER — Ambulatory Visit: Payer: Medicare PPO

## 2023-07-24 DIAGNOSIS — H18413 Arcus senilis, bilateral: Secondary | ICD-10-CM | POA: Diagnosis not present

## 2023-07-24 DIAGNOSIS — H26492 Other secondary cataract, left eye: Secondary | ICD-10-CM | POA: Diagnosis not present

## 2023-07-24 DIAGNOSIS — H02831 Dermatochalasis of right upper eyelid: Secondary | ICD-10-CM | POA: Diagnosis not present

## 2023-07-24 DIAGNOSIS — H0102A Squamous blepharitis right eye, upper and lower eyelids: Secondary | ICD-10-CM | POA: Diagnosis not present

## 2023-09-04 DIAGNOSIS — H0102A Squamous blepharitis right eye, upper and lower eyelids: Secondary | ICD-10-CM | POA: Diagnosis not present

## 2023-09-06 ENCOUNTER — Ambulatory Visit (INDEPENDENT_AMBULATORY_CARE_PROVIDER_SITE_OTHER): Payer: Medicare PPO | Admitting: Internal Medicine

## 2023-09-06 ENCOUNTER — Encounter: Payer: Self-pay | Admitting: Internal Medicine

## 2023-09-06 VITALS — BP 135/72 | HR 87 | Ht 70.0 in | Wt 164.2 lb

## 2023-09-06 DIAGNOSIS — R351 Nocturia: Secondary | ICD-10-CM

## 2023-09-06 DIAGNOSIS — E039 Hypothyroidism, unspecified: Secondary | ICD-10-CM | POA: Diagnosis not present

## 2023-09-06 DIAGNOSIS — E782 Mixed hyperlipidemia: Secondary | ICD-10-CM

## 2023-09-06 DIAGNOSIS — N401 Enlarged prostate with lower urinary tract symptoms: Secondary | ICD-10-CM

## 2023-09-06 DIAGNOSIS — R739 Hyperglycemia, unspecified: Secondary | ICD-10-CM

## 2023-09-06 DIAGNOSIS — F411 Generalized anxiety disorder: Secondary | ICD-10-CM | POA: Diagnosis not present

## 2023-09-06 DIAGNOSIS — Z0001 Encounter for general adult medical examination with abnormal findings: Secondary | ICD-10-CM | POA: Diagnosis not present

## 2023-09-06 DIAGNOSIS — E559 Vitamin D deficiency, unspecified: Secondary | ICD-10-CM | POA: Diagnosis not present

## 2023-09-06 MED ORDER — TRAZODONE HCL 50 MG PO TABS: 50.0000 mg | ORAL_TABLET | Freq: Every day | ORAL | 5 refills | Status: DC

## 2023-09-06 NOTE — Assessment & Plan Note (Addendum)
 On Flomax for BPH Was given Myrbetriq for urinary incontinence/urgency, but he has stopped it Followed by Urology - had acupuncture therapy

## 2023-09-06 NOTE — Progress Notes (Signed)
 Established Patient Office Visit  Subjective:  Patient ID: Scott Chang, male    DOB: 07-24-1940  Age: 83 y.o. MRN: [id number]  CC:  Chief Complaint  Patient presents with   Annual Exam    Cpe today    HPI Scott Chang is a 83 y.o. male with past medical history of OA, DDD of lumbar spine and BPPV who presents for f/u of GAD and insomnia.  GAD: He was having constant anxiety at night time and insomnia.  He was given trazodone 50 mg nightly, which has improved his sleep quality, but takes it PRN only.  He still wakes up at nighttime for urinating.  His anxiety and fatigue had also slightly improved due to better sleep quality. He has been feeling fatigued and lack of interest in activities for the last 2 years since his wife passed away. He still denies anhedonia, but has had insomnia. Denies any SI or HI currently.  Hypothyroidism: He was placed on levothyroxine 25 mcg QD.  He had seen slight improvement in fatigue initially, but has started feeling fatigued again.  Denies any recent change in weight or appetite.  HLD: His cholesterol level was elevated in 09/24.  He has been following low cholesterol diet.  He is physically active as well.  He also reports bilateral eye "tiredness and achiness" since 09/24, but denies any redness or discharge. Denies visual disturbance currently. He recently had Ophthalmology evaluation, was told of blepharitis and dermatochalazia of right eyelid. He has not noticed any improvement with new glasses.  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   Arthritis    hands   Bee sting allergy 01/21/2022   Benign paroxysmal positional vertigo 09/21/2021   Benign prostatic hyperplasia with nocturia 09/21/2021   Callus of foot 09/21/2021   Chronic ear pain, bilateral 04/25/2022   Chronic fatigue 07/27/2018   DDD (degenerative disc disease), cervical  07/27/2018   DDD (degenerative disc disease), lumbar 07/27/2018   Dyspnea on exertion 04/18/2019   GAD (generalized anxiety disorder)    History of colonic polyps 07/27/2018   HLD (hyperlipidemia) 04/18/2019   Hypercholesteremia    Hypothyroidism 01/17/2022   Neuropathy    Osteoarthritis 01/17/2022   Urinary urgency 09/21/2021   Vitamin D deficiency 07/27/2018    Past Surgical History:  Procedure Laterality Date   APPENDECTOMY     CHOLECYSTECTOMY     COLONOSCOPY N/A 05/09/2013   Procedure: COLONOSCOPY;  Surgeon: Malissa Hippo, MD;  Location: [facility];  Service: Endoscopy;  Laterality: N/A;  1030   COLONOSCOPY W/ BIOPSIES AND POLYPECTOMY     Left knee arthroscopy     Left shoulder arthroscopy     X 2    Family History  Problem Relation Age of Onset   Chronic Renal Failure Mother    Heart attack Father    Cancer Brother    Osteoarthritis Daughter    Colon cancer Neg Hx     Social History   Socioeconomic History   Marital status: Widowed    Spouse name: Not on file   Number of children: Not on file   Years of education: 12   Highest education level: High school graduate  Occupational History   Occupation: Retired    Comment: Personnel officer, lineman  Tobacco Use   Smoking status: Former    Current packs/day: 0.00    Average packs/day: 1 pack/day for 40.0  years (40.0 ttl pk-yrs)    Types: Cigarettes    Start date: 06/21/1948    Quit date: 06/21/1988    Years since quitting: 35.2   Smokeless tobacco: Never  Vaping Use   Vaping status: Never Used  Substance and Sexual Activity   Alcohol use: Not Currently   Drug use: No   Sexual activity: Not on file  Other Topics Concern   Not on file  Social History Narrative   Married for 52 years.Retired ,previously in Holiday representative.   Right handed    Drinks caffeine   One floor home   Lives [place]himeself   Social Drivers of Health   Financial Resource Strain: Low Risk  (01/17/2022)   Overall Financial Resource Strain  (CARDIA)    Difficulty of Paying Living Expenses: Not hard at all  Food Insecurity: No Food Insecurity (01/17/2022)   Hunger Vital Sign    Worried About Running Out of Food in the Last Year: Never true    Ran Out of Food in the Last Year: Never true  Transportation Needs: No Transportation Needs (01/17/2022)   PRAPARE - Administrator, Civil Service (Medical): No    Lack of Transportation (Non-Medical): No  Physical Activity: Sufficiently Active (01/17/2022)   Exercise Vital Sign    Days of Exercise per Week: 5 days    Minutes of Exercise per Session: 140 min  Stress: Stress Concern Present (01/17/2022)   [employer] of Occupational Health - Occupational Stress Questionnaire    Feeling of Stress : To some extent  Social Connections: Socially Isolated (01/17/2022)   Social Connection and Isolation Panel [NHANES]    Frequency of Communication with Friends and Family: More than three times a week    Frequency of Social Gatherings with Friends and Family: More than three times a week    Attends Religious Services: Never    Database administrator or Organizations: No    Attends Banker Meetings: Never    Marital Status: Widowed  Intimate Partner Violence: Not At Risk (01/17/2022)   Humiliation, Afraid, Rape, and Kick questionnaire    Fear of Current or Ex-Partner: No    Emotionally Abused: No    Physically Abused: No    Sexually Abused: No    Outpatient Medications Prior to Visit  Medication Sig Dispense Refill   augmented betamethasone dipropionate (DIPROLENE-AF) 0.05 % cream Apply topically.     bacitracin-polymyxin b (POLYSPORIN) ophthalmic ointment      clobetasol cream (TEMOVATE) 0.05 % Apply 1 Application topically 2 (two) times daily. 30 g 0   EPINEPHrine 0.3 mg/0.3 mL IJ SOAJ injection Inject 0.3 mg into the muscle as needed for anaphylaxis. 1 each 1   levothyroxine (SYNTHROID) 25 MCG tablet TAKE ONE TABLET BY MOUTH ONCE DAILY. 90 tablet 1    meclizine (ANTIVERT) 25 MG tablet TAKE (1) TABLET BY MOUTH TWICE A DAY AS NEEDED. 30 tablet 1   memantine (NAMENDA) 5 MG tablet Take 1 tablet (5 mg total) by mouth 2 (two) times daily. 60 tablet 11   Multiple Vitamin (MULTIVITAMIN WITH MINERALS) TABS tablet Take 1 tablet by mouth daily.     Propylene Glycol (SYSTANE COMPLETE OP) Apply to eye.     tamsulosin (FLOMAX) 0.4 MG CAPS capsule Take 1 capsule (0.4 mg total) by mouth daily after supper. 30 capsule 11   traZODone (DESYREL) 50 MG tablet Take 1 tablet (50 mg total) by mouth at bedtime. 30 tablet 3   No facility-administered medications  prior to visit.    Allergies  Allergen Reactions   Bee Venom Anaphylaxis   Penicillins Itching and Rash    ROS Review of Systems  Constitutional:  Positive for fatigue. Negative for chills and fever.  HENT:  Negative for congestion and sore throat.        B/l ear discomfort  Eyes:  Negative for discharge and redness.  Respiratory:  Negative for cough and shortness of breath.   Cardiovascular:  Negative for chest pain and palpitations.  Gastrointestinal:  Negative for diarrhea, nausea and vomiting.  Endocrine: Negative for polydipsia and polyuria.  Genitourinary:  Positive for urgency. Negative for dysuria and hematuria.       Nocturia  Musculoskeletal:  Negative for neck pain and neck stiffness.  Skin:  Negative for rash.  Neurological:  Negative for weakness, numbness and headaches.  Psychiatric/Behavioral:  Negative for agitation and behavioral problems. The patient is nervous/anxious.       Objective:    Physical Exam Vitals reviewed.  Constitutional:      General: He is not in acute distress.    Appearance: He is not diaphoretic.  HENT:     Head: Normocephalic and atraumatic.     Nose: Nose normal.     Mouth/Throat:     Mouth: Mucous membranes are moist.  Eyes:     General: No scleral icterus.    Extraocular Movements: Extraocular movements intact.  Cardiovascular:     Rate  and Rhythm: Normal rate and regular rhythm.     Heart sounds: Normal heart sounds. No murmur heard. Pulmonary:     Breath sounds: Normal breath sounds. No wheezing or rales.  Abdominal:     Palpations: Abdomen is soft.     Tenderness: There is no abdominal tenderness.  Musculoskeletal:     Cervical back: Neck supple. No tenderness.     Right lower leg: No edema.     Left lower leg: No edema.  Skin:    General: Skin is warm.     Findings: Lesion (Actinic keratoses - over right arm near elbow and hip area) present. No rash.  Neurological:     General: No focal deficit present.     Mental Status: He is alert and oriented to person, place, and time.     Cranial Nerves: No cranial nerve deficit.     Sensory: No sensory deficit.     Motor: No weakness.  Psychiatric:        Mood and Affect: Mood normal.        Behavior: Behavior normal.     BP 135/72   Pulse 87   Ht 5\' 10"  (1.778 m)   Wt 164 lb 3.2 oz (74.5 kg)   SpO2 98%   BMI 23.56 kg/m  Wt Readings from Last 3 Encounters:  09/06/23 164 lb 3.2 oz (74.5 kg)  06/06/23 165 lb (74.8 kg)  05/08/23 163 lb 12.8 oz (74.3 kg)    Lab Results  Component Value Date   TSH 3.280 03/02/2023   Lab Results  Component Value Date   WBC 7.8 03/02/2023   HGB 13.5 03/02/2023   HCT 41.9 03/02/2023   MCV 97 03/02/2023   PLT 187 03/02/2023   Lab Results  Component Value Date   NA 136 03/02/2023   K 4.3 03/02/2023   CO2 22 03/02/2023   GLUCOSE 90 03/02/2023   BUN 17 03/02/2023   CREATININE 0.81 03/02/2023   BILITOT 0.4 03/02/2023   ALKPHOS 78 03/02/2023  AST 29 03/02/2023   ALT 26 03/02/2023   PROT 6.5 03/02/2023   ALBUMIN 4.0 03/02/2023   CALCIUM 9.0 03/02/2023   EGFR 89 03/02/2023   Lab Results  Component Value Date   CHOL 239 (H) 12/21/2022   Lab Results  Component Value Date   HDL 46 12/21/2022   Lab Results  Component Value Date   LDLCALC 142 (H) 12/21/2022   Lab Results  Component Value Date   TRIG 282 (H)  12/21/2022   Lab Results  Component Value Date   CHOLHDL 5.2 (H) 12/21/2022   Lab Results  Component Value Date   HGBA1C 5.6 07/21/2022      Assessment & Plan:   Problem List Items Addressed This Visit       Endocrine   Hypothyroidism   Lab Results  Component Value Date   TSH 3.280 03/02/2023   With normal free T4 On Levothyroxine 25 mcg QD as he had symptoms - chronic fatigue and memory changes Check TSH and free T4      Relevant Orders   CMP14+EGFR   CBC with Differential/Platelet   TSH + free T4     Other   GAD (generalized anxiety disorder) (Chronic)   Had anxiety and insomnia On Trazodone 50 mg qHS PRN for insomnia, now improved Sleep hygiene discussed      Relevant Medications   traZODone (DESYREL) 50 MG tablet   Other Relevant Orders   CMP14+EGFR   CBC with Differential/Platelet   Vitamin D deficiency   Relevant Orders   VITAMIN D 25 Hydroxy (Vit-D Deficiency, Fractures)   HLD (hyperlipidemia)   Last lipid profile reviewed Had new onset hypothyroidism Recently started Levothyroxine for hypothyroidism Advised to follow low cholesterol diet for now      Relevant Orders   Lipid panel   Benign prostatic hyperplasia with nocturia   On Flomax for BPH Was given Myrbetriq for urinary incontinence/urgency, but he has stopped it Followed by Urology - had acupuncture therapy      Relevant Orders   PSA   Encounter for general adult medical examination with abnormal findings - Primary   Physical exam as documented. Fasting blood tests ordered.      Other Visit Diagnoses       Hyperglycemia       Relevant Orders   Hemoglobin A1c         Meds ordered this encounter  Medications   traZODone (DESYREL) 50 MG tablet    Sig: Take 1 tablet (50 mg total) by mouth at bedtime.    Dispense:  30 tablet    Refill:  5    Follow-up: Return in about 6 months (around 03/08/2024) for GAD and hypothyroidism.    Anabel Halon, MD

## 2023-09-06 NOTE — Assessment & Plan Note (Signed)
Physical exam as documented. Fasting blood tests ordered. 

## 2023-09-06 NOTE — Assessment & Plan Note (Signed)
Last lipid profile reviewed Had new onset hypothyroidism Recently started Levothyroxine for hypothyroidism Advised to follow low cholesterol diet for now

## 2023-09-06 NOTE — Assessment & Plan Note (Signed)
Had anxiety and insomnia On Trazodone 50 mg qHS PRN for insomnia, now improved Sleep hygiene discussed

## 2023-09-06 NOTE — Assessment & Plan Note (Addendum)
 Lab Results  Component Value Date   TSH 3.280 03/02/2023   With normal free T4 On Levothyroxine 25 mcg QD as he had symptoms - chronic fatigue and memory changes Check TSH and free T4

## 2023-09-06 NOTE — Patient Instructions (Signed)
Please continue to take medications as prescribed.  Please continue to follow low salt diet and perform moderate exercise/walking at least 150 mins/week. 

## 2023-09-07 LAB — CBC WITH DIFFERENTIAL/PLATELET
Basophils Absolute: 0.1 10*3/uL (ref 0.0–0.2)
Basos: 1 %
EOS (ABSOLUTE): 0.1 10*3/uL (ref 0.0–0.4)
Eos: 1 %
Hematocrit: 45.9 % (ref 37.5–51.0)
Hemoglobin: 15.5 g/dL (ref 13.0–17.7)
Immature Grans (Abs): 0 10*3/uL (ref 0.0–0.1)
Immature Granulocytes: 0 %
Lymphocytes Absolute: 1.9 10*3/uL (ref 0.7–3.1)
Lymphs: 25 %
MCH: 32.7 pg (ref 26.6–33.0)
MCHC: 33.8 g/dL (ref 31.5–35.7)
MCV: 97 fL (ref 79–97)
Monocytes Absolute: 0.6 10*3/uL (ref 0.1–0.9)
Monocytes: 8 %
Neutrophils Absolute: 5 10*3/uL (ref 1.4–7.0)
Neutrophils: 65 %
Platelets: 213 10*3/uL (ref 150–450)
RBC: 4.74 x10E6/uL (ref 4.14–5.80)
RDW: 12.9 % (ref 11.6–15.4)
WBC: 7.8 10*3/uL (ref 3.4–10.8)

## 2023-09-07 LAB — CMP14+EGFR
ALT: 28 IU/L (ref 0–44)
AST: 31 IU/L (ref 0–40)
Albumin: 4.6 g/dL (ref 3.8–4.8)
Alkaline Phosphatase: 87 IU/L (ref 44–121)
BUN/Creatinine Ratio: 17 (ref 10–24)
BUN: 15 mg/dL (ref 8–27)
Bilirubin Total: 0.5 mg/dL (ref 0.0–1.2)
CO2: 24 mmol/L (ref 20–29)
Calcium: 9.4 mg/dL (ref 8.6–10.2)
Chloride: 101 mmol/L (ref 96–106)
Creatinine, Ser: 0.88 mg/dL (ref 0.76–1.27)
Globulin, Total: 2.7 g/dL (ref 1.5–4.5)
Glucose: 99 mg/dL (ref 70–99)
Potassium: 4.7 mmol/L (ref 3.5–5.2)
Sodium: 140 mmol/L (ref 134–144)
Total Protein: 7.3 g/dL (ref 6.0–8.5)
eGFR: 87 mL/min/{1.73_m2} (ref >59)

## 2023-09-07 LAB — HEMOGLOBIN A1C
Est. average glucose Bld gHb Est-mCnc: 114 mg/dL
Hgb A1c MFr Bld: 5.6 % (ref 4.8–5.6)

## 2023-09-07 LAB — TSH+FREE T4
Free T4: 1.06 ng/dL (ref 0.82–1.77)
TSH: 3.56 u[IU]/mL (ref 0.450–4.500)

## 2023-09-07 LAB — PSA: Prostate Specific Ag, Serum: 0.2 ng/mL (ref 0.0–4.0)

## 2023-09-07 LAB — LIPID PANEL
Chol/HDL Ratio: 4.7 ratio (ref 0.0–5.0)
Cholesterol, Total: 225 mg/dL — ABNORMAL HIGH (ref 100–199)
HDL: 48 mg/dL (ref >39)
LDL Chol Calc (NIH): 144 mg/dL — ABNORMAL HIGH (ref 0–99)
Triglycerides: 186 mg/dL — ABNORMAL HIGH (ref 0–149)
VLDL Cholesterol Cal: 33 mg/dL (ref 5–40)

## 2023-09-07 LAB — VITAMIN D 25 HYDROXY (VIT D DEFICIENCY, FRACTURES): Vit D, 25-Hydroxy: 28.3 ng/mL — ABNORMAL LOW (ref 30.0–100.0)

## 2023-09-07 MED ORDER — LEVOTHYROXINE SODIUM 25 MCG PO TABS: 25.0000 ug | ORAL_TABLET | Freq: Every day | ORAL | 1 refills | Status: DC

## 2023-09-07 NOTE — Addendum Note (Signed)
 Addended byTrena Platt on: 09/07/2023 07:59 AM   Modules accepted: Orders

## 2023-09-08 DIAGNOSIS — Z961 Presence of intraocular lens: Secondary | ICD-10-CM | POA: Diagnosis not present

## 2023-09-08 DIAGNOSIS — H18413 Arcus senilis, bilateral: Secondary | ICD-10-CM | POA: Diagnosis not present

## 2023-09-08 DIAGNOSIS — I10 Essential (primary) hypertension: Secondary | ICD-10-CM | POA: Diagnosis not present

## 2023-09-08 DIAGNOSIS — H30149 Acute posterior multifocal placoid pigment epitheliopathy, unspecified eye: Secondary | ICD-10-CM | POA: Diagnosis not present

## 2023-10-12 ENCOUNTER — Encounter: Payer: Self-pay | Admitting: Physician Assistant

## 2023-10-30 ENCOUNTER — Encounter: Payer: Self-pay | Admitting: Internal Medicine

## 2023-10-30 ENCOUNTER — Other Ambulatory Visit: Payer: Self-pay | Admitting: Internal Medicine

## 2023-10-30 DIAGNOSIS — B002 Herpesviral gingivostomatitis and pharyngotonsillitis: Secondary | ICD-10-CM

## 2023-10-30 DIAGNOSIS — B001 Herpesviral vesicular dermatitis: Secondary | ICD-10-CM

## 2023-10-30 MED ORDER — VALACYCLOVIR HCL 500 MG PO TABS: 500.0000 mg | ORAL_TABLET | Freq: Two times a day (BID) | ORAL | 0 refills | Status: AC

## 2023-11-15 ENCOUNTER — Telehealth: Payer: Self-pay | Admitting: Physician Assistant

## 2023-11-15 ENCOUNTER — Ambulatory Visit: Admitting: Physician Assistant

## 2023-11-15 NOTE — Telephone Encounter (Signed)
 Pt.s daughter wanted to bring some details before next appt, please call

## 2023-11-15 NOTE — Telephone Encounter (Signed)
 I left message on voicemail, that appts have been cancelled, so she is welcome to call back.

## 2023-11-15 NOTE — Telephone Encounter (Signed)
 Patient cancelled appt, Scott Chang is on the dpr, will call shortly

## 2023-12-04 ENCOUNTER — Telehealth: Payer: Self-pay | Admitting: Internal Medicine

## 2023-12-04 NOTE — Telephone Encounter (Signed)
 DMV forms Noted Copied Scanned Original in provider box Copy at front desk

## 2023-12-05 ENCOUNTER — Ambulatory Visit: Payer: Medicare PPO | Admitting: Physician Assistant

## 2023-12-06 NOTE — Telephone Encounter (Signed)
Forms were picked up. 

## 2024-03-04 ENCOUNTER — Other Ambulatory Visit: Payer: Self-pay

## 2024-03-04 ENCOUNTER — Telehealth: Payer: Self-pay

## 2024-03-04 DIAGNOSIS — Z23 Encounter for immunization: Secondary | ICD-10-CM

## 2024-03-04 MED ORDER — COMIRNATY 30 MCG/0.3ML IM SUSY: 0.3000 mL | PREFILLED_SYRINGE | Freq: Once | INTRAMUSCULAR | 0 refills | Status: AC

## 2024-03-04 NOTE — Telephone Encounter (Signed)
 Copied from CRM [id number]. Topic: Clinical - Medication Question >> Mar 04, 2024  3:56 PM Scott Chang wrote: Reason for CRM: Covid Vaccine Prescription Request Brand: no prefference Pharmacy: [facility] [address] [place] - [address] [place] [address] Phone: [phone] Fax: [phone] *Patient should verify pharmacy had the vaccine in stock  Patient can be reached at [phone]

## 2024-03-04 NOTE — Telephone Encounter (Signed)
Pt informed

## 2024-03-04 NOTE — Telephone Encounter (Signed)
 Rx sent to pharmacy

## 2024-03-06 ENCOUNTER — Telehealth (INDEPENDENT_AMBULATORY_CARE_PROVIDER_SITE_OTHER): Payer: Self-pay | Admitting: Otolaryngology

## 2024-03-06 ENCOUNTER — Ambulatory Visit

## 2024-03-06 DIAGNOSIS — H04123 Dry eye syndrome of bilateral lacrimal glands: Secondary | ICD-10-CM | POA: Diagnosis not present

## 2024-03-06 NOTE — Telephone Encounter (Signed)
 Patient 's daughter called regarding to Elocon  cream refill. Patient will see his PCP on Friday 03/08/24 and will ask for a new prescription from him.

## 2024-03-08 ENCOUNTER — Encounter: Payer: Self-pay | Admitting: Internal Medicine

## 2024-03-08 ENCOUNTER — Ambulatory Visit (INDEPENDENT_AMBULATORY_CARE_PROVIDER_SITE_OTHER): Admitting: Internal Medicine

## 2024-03-08 VITALS — BP 126/70 | HR 74 | Ht 70.0 in | Wt 164.2 lb

## 2024-03-08 DIAGNOSIS — H02403 Unspecified ptosis of bilateral eyelids: Secondary | ICD-10-CM

## 2024-03-08 DIAGNOSIS — R5382 Chronic fatigue, unspecified: Secondary | ICD-10-CM

## 2024-03-08 DIAGNOSIS — E039 Hypothyroidism, unspecified: Secondary | ICD-10-CM | POA: Diagnosis not present

## 2024-03-08 DIAGNOSIS — F411 Generalized anxiety disorder: Secondary | ICD-10-CM | POA: Diagnosis not present

## 2024-03-08 DIAGNOSIS — E782 Mixed hyperlipidemia: Secondary | ICD-10-CM

## 2024-03-08 DIAGNOSIS — L309 Dermatitis, unspecified: Secondary | ICD-10-CM | POA: Diagnosis not present

## 2024-03-08 MED ORDER — TRAZODONE HCL 100 MG PO TABS: 100.0000 mg | ORAL_TABLET | Freq: Every day | ORAL | 5 refills | Status: AC

## 2024-03-08 MED ORDER — MOMETASONE FUROATE 0.1 % EX CREA: TOPICAL_CREAM | Freq: Every day | CUTANEOUS | 1 refills | Status: AC

## 2024-03-08 NOTE — Assessment & Plan Note (Signed)
Last lipid profile reviewed Advised to follow low-cholesterol diet for now 

## 2024-03-08 NOTE — Assessment & Plan Note (Addendum)
 Had anxiety and insomnia On Trazodone  50 mg qHS PRN for insomnia, but still has anxiety spells and difficulty maintaining sleep Increased dose of trazodone  to 100 mg qHS Sleep hygiene discussed

## 2024-03-08 NOTE — Assessment & Plan Note (Signed)
 Lab Results  Component Value Date   TSH 3.560 09/06/2023   With normal free T4 Had started Levothyroxine  25 mcg QD as he had symptoms - chronic fatigue and memory changes, but he has stopped taking it now Check TSH and free T4

## 2024-03-08 NOTE — Assessment & Plan Note (Signed)
 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

## 2024-03-08 NOTE — Patient Instructions (Signed)
 Please start taking Trazodone  as prescribed for insomnia.  Please continue to take medications as prescribed.  Please continue to follow low salt diet and perform moderate exercise/walking as tolerated.

## 2024-03-08 NOTE — Assessment & Plan Note (Signed)
 Has chronic, recurrent ear dryness and itching Has had ENT specialist evaluation in the past -was told of eczema Refilled mometasone  cream

## 2024-03-08 NOTE — Progress Notes (Signed)
 Established Patient Office Visit  Subjective:  Patient ID: Scott Chang, male    DOB: [birth year]  Age: 83 y.o. MRN: [id number]  CC:  Chief Complaint  Patient presents with   Anxiety    6 month f/u    Hypothyroidism    6 month f/u     HPI Scott Chang is a 83 y.o. male with past medical history of OA, DDD of lumbar spine and BPPV who presents for f/u of GAD and insomnia.  GAD: He still has constant anxiety at night time and insomnia.  He was given trazodone  50 mg nightly, which has improved his sleep quality at times, so he takes it PRN only.  He still wakes up at nighttime for urinating.  He has been feeling fatigued and lack of interest in activities since his wife passed away. Denies any SI or HI currently.  Hypothyroidism: He was placed on levothyroxine  25 mcg once daily, but has stopped taking it now.  He had seen slight improvement in fatigue initially with levothyroxine , but later started feeling fatigued again.  Denies any recent change in weight or appetite.  HLD: His cholesterol level was elevated in 09/24.  He has been following low cholesterol diet.  He is physically active as well.  He also reports bilateral eye tiredness and achiness since 09/24, but denies any redness or discharge. Denies visual disturbance currently. He recently had Ophthalmology evaluation, was told of blepharitis and dermatochalazia of right eyelid. He has not noticed any improvement with new glasses.  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   Arthritis    hands   Bee sting allergy 01/21/2022   Benign paroxysmal positional vertigo 09/21/2021   Benign prostatic hyperplasia with nocturia 09/21/2021   Callus of foot 09/21/2021   Chronic ear pain, bilateral 04/25/2022   Chronic fatigue 07/27/2018   DDD (degenerative disc disease), cervical 07/27/2018   DDD (degenerative disc disease), lumbar  07/27/2018   Dyspnea on exertion 04/18/2019   GAD (generalized anxiety disorder)    History of colonic polyps 07/27/2018   HLD (hyperlipidemia) 04/18/2019   Hypercholesteremia    Hypothyroidism 01/17/2022   Neuropathy    Osteoarthritis 01/17/2022   Urinary urgency 09/21/2021   Vitamin D  deficiency 07/27/2018    Past Surgical History:  Procedure Laterality Date   APPENDECTOMY     CHOLECYSTECTOMY     COLONOSCOPY N/A 05/09/2013   Procedure: COLONOSCOPY;  Surgeon: Claudis RAYMOND Rivet, MD;  Location: [facility];  Service: Endoscopy;  Laterality: N/A;  1030   COLONOSCOPY W/ BIOPSIES AND POLYPECTOMY     Left knee arthroscopy     Left shoulder arthroscopy     X 2    Family History  Problem Relation Age of Onset   Chronic Renal Failure Mother    Heart attack Father    Cancer Brother    Osteoarthritis Daughter    Colon cancer Neg Hx     Social History   Socioeconomic History   Marital status: Widowed    Spouse name: Not on file   Number of children: Not on file   Years of education: 12   Highest education level: High school graduate  Occupational History   Occupation: Retired    Comment: Personnel officer, lineman  Tobacco Use   Smoking status: Former    Current packs/day: 0.00    Average packs/day: 1 pack/day for 40.0 years (40.0 ttl  pk-yrs)    Types: Cigarettes    Start date: 06/21/1948    Quit date: 06/21/1988    Years since quitting: 35.7   Smokeless tobacco: Never  Vaping Use   Vaping status: Never Used  Substance and Sexual Activity   Alcohol  use: Not Currently   Drug use: No   Sexual activity: Not on file  Other Topics Concern   Not on file  Social History Narrative   Married for 52 years.Retired ,previously in Holiday representative.   Right handed    Drinks caffeine   One floor home   Lives [place]   Social Drivers of Health   Financial Resource Strain: Low Risk  (03/05/2024)   Overall Financial Resource Strain (CARDIA)    Difficulty of Paying Living Expenses: Not  very hard  Food Insecurity: No Food Insecurity (03/05/2024)   Hunger Vital Sign    Worried About Running Out of Food in the Last Year: Never true    Ran Out of Food in the Last Year: Never true  Transportation Needs: No Transportation Needs (03/05/2024)   PRAPARE - Administrator, Civil Service (Medical): No    Lack of Transportation (Non-Medical): No  Physical Activity: Insufficiently Active (03/05/2024)   Exercise Vital Sign    Days of Exercise per Week: 1 day    Minutes of Exercise per Session: 80 min  Stress: Stress Concern Present (03/05/2024)   [employer] of Occupational Health - Occupational Stress Questionnaire    Feeling of Stress: Very much  Social Connections: Socially Isolated (03/05/2024)   Social Connection and Isolation Panel    Frequency of Communication with Friends and Family: More than three times a week    Frequency of Social Gatherings with Friends and Family: Once a week    Attends Religious Services: Patient declined    Database administrator or Organizations: No    Attends Banker Meetings: Not on file    Marital Status: Widowed  Intimate Partner Violence: Not At Risk (01/17/2022)   Humiliation, Afraid, Rape, and Kick questionnaire    Fear of Current or Ex-Partner: No    Emotionally Abused: No    Physically Abused: No    Sexually Abused: No    Outpatient Medications Prior to Visit  Medication Sig Dispense Refill   augmented betamethasone dipropionate (DIPROLENE-AF) 0.05 % cream Apply topically.     EPINEPHrine  0.3 mg/0.3 mL IJ SOAJ injection Inject 0.3 mg into the muscle as needed for anaphylaxis. 1 each 1   Multiple Vitamin (MULTIVITAMIN WITH MINERALS) TABS tablet Take 1 tablet by mouth daily.     Propylene Glycol (SYSTANE COMPLETE OP) Apply to eye.     valACYclovir  (VALTREX ) 500 MG tablet Take 1 tablet (500 mg total) by mouth 2 (two) times daily. 10 tablet 0   memantine  (NAMENDA ) 5 MG tablet Take 1 tablet (5 mg total) by mouth  2 (two) times daily. 60 tablet 11   traZODone  (DESYREL ) 50 MG tablet Take 1 tablet (50 mg total) by mouth at bedtime. 30 tablet 5   bacitracin-polymyxin b (POLYSPORIN) ophthalmic ointment      clobetasol  cream (TEMOVATE ) 0.05 % Apply 1 Application topically 2 (two) times daily. 30 g 0   levothyroxine  (SYNTHROID ) 25 MCG tablet Take 1 tablet (25 mcg total) by mouth daily. 90 tablet 1   meclizine  (ANTIVERT ) 25 MG tablet TAKE (1) TABLET BY MOUTH TWICE A DAY AS NEEDED. 30 tablet 1   tamsulosin  (FLOMAX ) 0.4 MG CAPS capsule Take 1  capsule (0.4 mg total) by mouth daily after supper. 30 capsule 11   No facility-administered medications prior to visit.    Allergies  Allergen Reactions   Bee Venom Anaphylaxis   Penicillins Itching and Rash    ROS Review of Systems  Constitutional:  Positive for fatigue. Negative for chills and fever.  HENT:  Negative for congestion and sore throat.        B/l ear discomfort  Eyes:  Negative for discharge and redness.  Respiratory:  Negative for cough and shortness of breath.   Cardiovascular:  Negative for chest pain and palpitations.  Gastrointestinal:  Negative for diarrhea, nausea and vomiting.  Endocrine: Negative for polydipsia and polyuria.  Genitourinary:  Positive for urgency. Negative for dysuria and hematuria.       Nocturia  Musculoskeletal:  Negative for neck pain and neck stiffness.  Skin:  Negative for rash.  Neurological:  Negative for weakness, numbness and headaches.  Psychiatric/Behavioral:  Negative for agitation and behavioral problems. The patient is nervous/anxious.       Objective:    Physical Exam Vitals reviewed.  Constitutional:      General: He is not in acute distress.    Appearance: He is not diaphoretic.  HENT:     Head: Normocephalic and atraumatic.     Ears:     Comments: Dry scabbing over bilateral external ear    Nose: Nose normal.     Mouth/Throat:     Mouth: Mucous membranes are moist.  Eyes:     General: No  scleral icterus.    Extraocular Movements: Extraocular movements intact.  Cardiovascular:     Rate and Rhythm: Normal rate and regular rhythm.     Heart sounds: Normal heart sounds. No murmur heard. Pulmonary:     Breath sounds: Normal breath sounds. No wheezing or rales.  Abdominal:     Palpations: Abdomen is soft.     Tenderness: There is no abdominal tenderness.  Musculoskeletal:     Cervical back: Neck supple. No tenderness.     Right lower leg: No edema.     Left lower leg: No edema.  Skin:    General: Skin is warm.     Findings: Lesion (Actinic keratoses - over right arm near elbow and hip area) present. No rash.  Neurological:     General: No focal deficit present.     Mental Status: He is alert and oriented to person, place, and time.     Cranial Nerves: No cranial nerve deficit.     Sensory: No sensory deficit.     Motor: No weakness.  Psychiatric:        Mood and Affect: Mood normal.        Behavior: Behavior normal.     BP 126/70 (BP Location: Left Arm)   Pulse 74   Ht 5' 10 (1.778 m)   Wt 164 lb 3.2 oz (74.5 kg)   SpO2 94%   BMI 23.56 kg/m  Wt Readings from Last 3 Encounters:  03/08/24 164 lb 3.2 oz (74.5 kg)  09/06/23 164 lb 3.2 oz (74.5 kg)  06/06/23 165 lb (74.8 kg)    Lab Results  Component Value Date   TSH 3.560 09/06/2023   Lab Results  Component Value Date   WBC 7.8 09/06/2023   HGB 15.5 09/06/2023   HCT 45.9 09/06/2023   MCV 97 09/06/2023   PLT 213 09/06/2023   Lab Results  Component Value Date   NA 140 09/06/2023  K 4.7 09/06/2023   CO2 24 09/06/2023   GLUCOSE 99 09/06/2023   BUN 15 09/06/2023   CREATININE 0.88 09/06/2023   BILITOT 0.5 09/06/2023   ALKPHOS 87 09/06/2023   AST 31 09/06/2023   ALT 28 09/06/2023   PROT 7.3 09/06/2023   ALBUMIN 4.6 09/06/2023   CALCIUM 9.4 09/06/2023   EGFR 87 09/06/2023   Lab Results  Component Value Date   CHOL 225 (H) 09/06/2023   Lab Results  Component Value Date   HDL 48 09/06/2023    Lab Results  Component Value Date   LDLCALC 144 (H) 09/06/2023   Lab Results  Component Value Date   TRIG 186 (H) 09/06/2023   Lab Results  Component Value Date   CHOLHDL 4.7 09/06/2023   Lab Results  Component Value Date   HGBA1C 5.6 09/06/2023      Assessment & Plan:   Problem List Items Addressed This Visit       Endocrine   Hypothyroidism   Lab Results  Component Value Date   TSH 3.560 09/06/2023   With normal free T4 Had started Levothyroxine  25 mcg QD as he had symptoms - chronic fatigue and memory changes, but he has stopped taking it now Check TSH and free T4      Relevant Orders   TSH + free T4   CMP14+EGFR     Musculoskeletal and Integument   Eczema   Has chronic, recurrent ear dryness and itching Has had ENT specialist evaluation in the past -was told of eczema Refilled mometasone  cream      Relevant Medications   mometasone  (ELOCON ) 0.1 % cream     Other   GAD (generalized anxiety disorder) - Primary (Chronic)   Had anxiety and insomnia On Trazodone  50 mg qHS PRN for insomnia, but still has anxiety spells and difficulty maintaining sleep Increased dose of trazodone  to 100 mg qHS Sleep hygiene discussed      Relevant Medications   traZODone  (DESYREL ) 100 MG tablet   Chronic fatigue   Was placed on NP thyroid  and testosterone  in the past by previous PCP, but he did not like it and stopped taking them Unclear etiology, but 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 since his wife passed away - his fatigue, insomnia and memory concerns have appeared since then, have started Trazodone , needs to take it regularly Check TSH and free T4, CMP  Due to eye symptoms and chronic fatigue, concern for myasthenia gravis, will check acetylcholine receptor blocking antibodies      Relevant Orders   Acetylcholine receptor, blocking Abs   CMP14+EGFR   HLD (hyperlipidemia)   Last lipid profile reviewed Advised to follow low cholesterol diet for now       Other Visit Diagnoses       Drooping eyelid disease, bilateral       Relevant Orders   Acetylcholine receptor, blocking Abs          Meds ordered this encounter  Medications   traZODone  (DESYREL ) 100 MG tablet    Sig: Take 1 tablet (100 mg total) by mouth at bedtime.    Dispense:  30 tablet    Refill:  5   mometasone  (ELOCON ) 0.1 % cream    Sig: Apply topically daily.    Dispense:  45 g    Refill:  1    Follow-up: Return in about 6 months (around 09/05/2024) for GAD and hypothyroidism (after 09/07/23).    Suzzane MARLA Blanch, MD

## 2024-03-14 ENCOUNTER — Ambulatory Visit: Payer: Self-pay | Admitting: Internal Medicine

## 2024-03-14 LAB — CMP14+EGFR
ALT: 30 IU/L (ref 0–44)
AST: 32 IU/L (ref 0–40)
Albumin: 4.4 g/dL (ref 3.7–4.7)
Alkaline Phosphatase: 84 IU/L (ref 48–129)
BUN/Creatinine Ratio: 16 (ref 10–24)
BUN: 13 mg/dL (ref 8–27)
Bilirubin Total: 0.4 mg/dL (ref 0.0–1.2)
CO2: 24 mmol/L (ref 20–29)
Calcium: 9.3 mg/dL (ref 8.6–10.2)
Chloride: 101 mmol/L (ref 96–106)
Creatinine, Ser: 0.82 mg/dL (ref 0.76–1.27)
Globulin, Total: 2.6 g/dL (ref 1.5–4.5)
Glucose: 90 mg/dL (ref 70–99)
Potassium: 4.6 mmol/L (ref 3.5–5.2)
Sodium: 138 mmol/L (ref 134–144)
Total Protein: 7 g/dL (ref 6.0–8.5)
eGFR: 88 mL/min/1.73 (ref >59)

## 2024-03-14 LAB — TSH+FREE T4
Free T4: 1.08 ng/dL (ref 0.82–1.77)
TSH: 4.19 u[IU]/mL (ref 0.450–4.500)

## 2024-03-14 LAB — ACETYLCHOLINE RECEPTOR, BLOCKING: Acetylchol Block Ab: 22 % (ref 0–25)

## 2024-03-27 ENCOUNTER — Ambulatory Visit: Admitting: Orthopedic Surgery

## 2024-04-11 ENCOUNTER — Other Ambulatory Visit: Payer: Self-pay | Admitting: Internal Medicine

## 2024-04-11 ENCOUNTER — Encounter: Payer: Self-pay | Admitting: Internal Medicine

## 2024-04-11 DIAGNOSIS — H8110 Benign paroxysmal vertigo, unspecified ear: Secondary | ICD-10-CM

## 2024-04-11 MED ORDER — MECLIZINE HCL 25 MG PO TABS: 25.0000 mg | ORAL_TABLET | Freq: Two times a day (BID) | ORAL | 0 refills | Status: AC | PRN

## 2024-05-08 ENCOUNTER — Other Ambulatory Visit: Payer: Self-pay | Admitting: Internal Medicine

## 2024-05-08 ENCOUNTER — Encounter: Payer: Self-pay | Admitting: Internal Medicine

## 2024-05-08 DIAGNOSIS — H8110 Benign paroxysmal vertigo, unspecified ear: Secondary | ICD-10-CM

## 2024-05-17 ENCOUNTER — Encounter (INDEPENDENT_AMBULATORY_CARE_PROVIDER_SITE_OTHER): Payer: Self-pay

## 2024-06-19 ENCOUNTER — Encounter (INDEPENDENT_AMBULATORY_CARE_PROVIDER_SITE_OTHER): Payer: Self-pay

## 2024-06-24 NOTE — Therapy (Incomplete)
 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   Arthritis    hands   Bee sting allergy 01/21/2022   Benign paroxysmal positional vertigo 09/21/2021   Benign prostatic hyperplasia with nocturia 09/21/2021   Callus of foot 09/21/2021   Chronic ear pain, bilateral 04/25/2022   Chronic fatigue 07/27/2018   DDD (degenerative disc disease), cervical 07/27/2018   DDD (degenerative disc disease), lumbar 07/27/2018   Dyspnea on exertion 04/18/2019   GAD (generalized anxiety disorder)    History of colonic polyps 07/27/2018   HLD (hyperlipidemia) 04/18/2019   Hypercholesteremia    Hypothyroidism 01/17/2022   Neuropathy    Osteoarthritis 01/17/2022   Urinary urgency 09/21/2021   Vitamin D  deficiency 07/27/2018   Past Surgical History:  Procedure Laterality Date   APPENDECTOMY     CHOLECYSTECTOMY     COLONOSCOPY N/A 05/09/2013   Procedure: COLONOSCOPY;  Surgeon: Claudis RAYMOND Rivet, MD;  Location: [facility];  Service: Endoscopy;  Laterality: N/A;  1030   COLONOSCOPY W/ BIOPSIES AND POLYPECTOMY     Left knee arthroscopy     Left shoulder arthroscopy     X 2   Patient Active Problem List   Diagnosis Date Noted   Eczema 03/08/2024   Eye pain, bilateral 05/12/2023   Acute non-recurrent frontal sinusitis 03/07/2023   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   Encounter for general adult medical examination with abnormal findings 07/20/2022   GAD (generalized anxiety disorder)    Chronic ear pain, bilateral 04/25/2022   Actinic keratosis 04/25/2022   Bee sting allergy 01/21/2022   Osteoarthritis 01/17/2022   Hypothyroidism 01/17/2022   Benign paroxysmal positional vertigo 09/21/2021    Benign prostatic hyperplasia with nocturia 09/21/2021   Urinary urgency 09/21/2021   HLD (hyperlipidemia) 04/18/2019   Chronic fatigue 07/27/2018   History of colonic polyps 07/27/2018   Vitamin D  deficiency 07/27/2018   Neuropathy 07/27/2018   DDD (degenerative disc disease), cervical 07/27/2018   DDD (degenerative disc disease), lumbar 07/27/2018    PCP: Tobie Suzzane POUR, MD REFERRING PROVIDER: Tobie Suzzane POUR, MD  REFERRING DIAG: H81.10 (ICD-10-CM) - Benign paroxysmal positional vertigo, unspecified laterality  THERAPY DIAG:  No diagnosis found.  ONSET DATE: ***  Rationale for Evaluation and Treatment: Rehabilitation  SUBJECTIVE:   SUBJECTIVE STATEMENT: *** Pt accompanied by: {accompnied:27141}  PERTINENT HISTORY: ***  PAIN:  Are you having pain? {OPRCPAIN:27236}  PRECAUTIONS: {Therapy precautions:24002}  RED FLAGS: {PT Red Flags:29287}   WEIGHT BEARING RESTRICTIONS: {Yes ***/No:24003}  FALLS: Has patient fallen in last 6 months? {fallsyesno:27318}  LIVING ENVIRONMENT: Lives with: {OPRC lives with:25569::lives with their family} Lives in: {Lives in:25570} Stairs: {opstairs:27293} Has following equipment at home: {Assistive devices:23999}  PLOF: {PLOF:24004}  PATIENT GOALS: ***  OBJECTIVE:  Note: Objective measures were completed at Evaluation unless otherwise noted.  DIAGNOSTIC FINDINGS: ***  COGNITION: Overall cognitive status: {cognition:24006}   SENSATION: {sensation:27233}  EDEMA:  {edema:24020}  MUSCLE TONE:  {LE tone:25568}  DTRs:  {DTR SITE:24025}  POSTURE:  {posture:25561}  Cervical ROM:    Active A/PROM (deg) eval  Flexion   Extension   Right lateral flexion   Left lateral flexion   Right rotation   Left rotation   (Blank rows = not tested)  STRENGTH: ***  LOWER EXTREMITY MMT:   MMT Right eval Left eval  Hip flexion    Hip abduction    Hip adduction    Hip internal rotation    Hip external rotation     Knee flexion    Knee extension    Ankle dorsiflexion    Ankle plantarflexion    Ankle inversion    Ankle eversion    (Blank rows = not tested)  BED MOBILITY:  {Bed mobility:24027}  TRANSFERS: Assistive device utilized: {Assistive devices:23999}  Sit to stand: {Levels of assistance:24026} Stand to sit: {Levels of assistance:24026} Chair to chair: {Levels of assistance:24026} Floor: {Levels of assistance:24026}  RAMP: {Levels of assistance:24026}  CURB: {Levels of assistance:24026}  GAIT: Gait pattern: {gait characteristics:25376} Distance walked: *** Assistive device utilized: {Assistive devices:23999} Level of assistance: {Levels of assistance:24026} Comments: ***  FUNCTIONAL TESTS:  {Functional tests:24029}  PATIENT SURVEYS:  {rehab surveys:24030}  VESTIBULAR ASSESSMENT:  GENERAL OBSERVATION: ***   SYMPTOM BEHAVIOR:  Subjective history: ***  Non-Vestibular symptoms: {nonvestibular symptoms:25260}  Type of dizziness: {Type of Dizziness:25255}  Frequency: ***  Duration: ***  Aggravating factors: {Aggravating Factors:25258}  Relieving factors: {Relieving Factors:25259}  Progression of symptoms: {DESC; BETTER/WORSE:18575}  OCULOMOTOR EXAM:  Ocular Alignment: {Ocular Alignment:25262}  Ocular ROM: {RANGE OF MOTION:21649}  Spontaneous Nystagmus: {Spontaneous nystagmus:25263}  Gaze-Induced Nystagmus: {gaze-induced nystagmus:25264}  Smooth Pursuits: {smooth pursuit:25265}  Saccades: {saccades:25266}  Convergence/Divergence: *** cm   Cover-cross-cover test: {cover test:33756}   VESTIBULAR - OCULAR REFLEX:   Slow VOR: {slow VOR:25290}  VOR Cancellation: {vor cancellation:25291}  Head-Impulse Test: {head impulse test:25272}  Dynamic Visual Acuity: {dynamic visual acuity:25273}   POSITIONAL TESTING: {Positional tests:25271}  MOTION SENSITIVITY:  Motion Sensitivity Quotient Intensity: 0 = none, 1 = Lightheaded, 2 = Mild, 3 = Moderate, 4 = Severe, 5 =  Vomiting  Intensity  1. Sitting to supine   2. Supine to L side   3. Supine to R side   4. Supine to sitting   5. L Hallpike-Dix   6. Up from L    7. R Hallpike-Dix   8. Up from R    9. Sitting, head tipped to L knee   10. Head up from L knee   11. Sitting, head tipped to R knee   12. Head up from R knee   13. Sitting head turns x5   14.Sitting head nods x5   15. In stance, 180 turn to L    16. In stance, 180 turn to R     OTHOSTATICS: {Exam; orthostatics:31331}  FUNCTIONAL GAIT: {Functional tests:24029}                                                                                                                             TREATMENT DATE: 06/25/2024 physical therapy evaluation and HEP instruction   Canalith Repositioning:  {Canalith Repositioning:25283} Gaze Adaptation:  {gaze adaptation:25286} Habituation:  {habituation:25288} Other: ***  PATIENT EDUCATION: Education details: Patient educated on exam findings,  POC, scope of PT, HEP, and ***. Person educated: Patient Education method: Explanation, Demonstration, and Handouts Education comprehension: verbalized understanding, returned demonstration, verbal cues required, and tactile cues required  HOME EXERCISE PROGRAM:  GOALS: Goals reviewed with patient? No  SHORT TERM GOALS: Target date: ***  *** Baseline: Goal status: {GOALSTATUS:25110}  2.  *** Baseline:  Goal status: {GOALSTATUS:25110}  3.  *** Baseline:  Goal status: {GOALSTATUS:25110}  4.  *** Baseline:  Goal status: {GOALSTATUS:25110}  5.  *** Baseline:  Goal status: {GOALSTATUS:25110}  6.  *** Baseline:  Goal status: {GOALSTATUS:25110}  LONG TERM GOALS: Target date: ***  *** Baseline:  Goal status: {GOALSTATUS:25110}  2.  *** Baseline:  Goal status: {GOALSTATUS:25110}  3.  *** Baseline:  Goal status: {GOALSTATUS:25110}  4.  *** Baseline:  Goal status: {GOALSTATUS:25110}  5.  *** Baseline:  Goal status:  {GOALSTATUS:25110}  6.  *** Baseline:  Goal status: {GOALSTATUS:25110}  ASSESSMENT:  CLINICAL IMPRESSION: Patient is a *** y.o. *** who was seen today for physical therapy evaluation and treatment for ***.   OBJECTIVE IMPAIRMENTS: {opptimpairments:25111}.   ACTIVITY LIMITATIONS: {activitylimitations:27494}  PARTICIPATION LIMITATIONS: {participationrestrictions:25113}  PERSONAL FACTORS: {Personal factors:25162} are also affecting patient's functional outcome.   REHAB POTENTIAL: {rehabpotential:25112}  CLINICAL DECISION MAKING: {clinical decision making:25114}  EVALUATION COMPLEXITY: {Evaluation complexity:25115}   PLAN:  PT FREQUENCY: {rehab frequency:25116}  PT DURATION: {rehab duration:25117}  PLANNED INTERVENTIONS: 97164- PT Re-evaluation, 97110-Therapeutic exercises, 97530- Therapeutic activity, 97112- Neuromuscular re-education, 97535- Self Care, 02859- Manual therapy, [id number]- Gait training, [phone]- Orthotic Fit/training, [phone]- Canalith repositioning, [id number]- Aquatic Therapy, [id number]- Splinting, [id number]- Wound care (first 20 sq cm), 97598- Wound care (each additional 20 sq cm)Patient/Family education, Balance training, Stair training, Taping, Dry Needling, Joint mobilization, Joint manipulation, Spinal manipulation, Spinal mobilization, Scar mobilization, and DME instructions.   PLAN FOR NEXT SESSION: Review HEP and goals  7:29 AM, 06/25/2024 Lysa Livengood Small [place] Seese MPT [facility] [place] [phone] Ph:[phone]  "

## 2024-06-25 ENCOUNTER — Ambulatory Visit (HOSPITAL_COMMUNITY)

## 2024-09-05 ENCOUNTER — Ambulatory Visit: Admitting: Internal Medicine
